# Patient Record
Sex: Female | Born: 1995 | Race: White | Hispanic: No | Marital: Married | Smoking: Never smoker
Health system: Southern US, Community
[De-identification: ages and names within clinical notes are randomized; demographics above are authoritative.]

## PROBLEM LIST (undated history)

## (undated) ENCOUNTER — Inpatient Hospital Stay (HOSPITAL_COMMUNITY): Payer: Self-pay

## (undated) ENCOUNTER — Ambulatory Visit: Admission: EM | Payer: BC Managed Care – PPO

## (undated) DIAGNOSIS — F419 Anxiety disorder, unspecified: Secondary | ICD-10-CM

## (undated) DIAGNOSIS — I499 Cardiac arrhythmia, unspecified: Secondary | ICD-10-CM

## (undated) HISTORY — PX: UPPER GI ENDOSCOPY: SHX6162

## (undated) HISTORY — PX: WISDOM TOOTH EXTRACTION: SHX21

## (undated) HISTORY — DX: Cardiac arrhythmia, unspecified: I49.9

---
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
## 2014-08-09 ENCOUNTER — Ambulatory Visit: Payer: Self-pay

## 2016-11-19 ENCOUNTER — Ambulatory Visit (INDEPENDENT_AMBULATORY_CARE_PROVIDER_SITE_OTHER): Payer: BC Managed Care – PPO | Admitting: Certified Nurse Midwife

## 2016-11-19 ENCOUNTER — Encounter: Payer: Self-pay | Admitting: Certified Nurse Midwife

## 2016-11-19 VITALS — BP 126/86 | HR 83 | Ht 68.0 in | Wt 235.2 lb

## 2016-11-19 DIAGNOSIS — N912 Amenorrhea, unspecified: Secondary | ICD-10-CM

## 2016-11-19 LAB — POCT URINE PREGNANCY: Preg Test, Ur: NEGATIVE

## 2016-11-19 MED ORDER — MEDROXYPROGESTERONE ACETATE 10 MG PO TABS: 10.0000 mg | ORAL_TABLET | Freq: Every day | ORAL | 0 refills | Status: DC

## 2016-11-19 NOTE — Progress Notes (Signed)
Subjective:    Adriana Lewis is a 21 y.o. female who presents for evaluation missed menses. Last menstrual period was 2 month ago. Current menses pattern: regular ( previously).   Current contraceptive method: OCP (estrogen/progesterone). Associated/contributing factors include none. Patient denies acne, family history of diabetes, hirsutism, infertility and low body fat. Evaluation to date: none Patient's last menstrual period was 10/06/2016. The following portions of the patient's history were reviewed and updated as appropriate: allergies, current medications, past family history, past medical history, past social history, past surgical history and problem list. Review of Systems Constitutional: negative Eyes: negative Ears, nose, mouth, throat, and face: negative Respiratory: negative Cardiovascular: negative Gastrointestinal: negative Genitourinary:negative Integument/breast: negative Hematologic/lymphatic: negative Musculoskeletal:negative Neurological: negative Behavioral/Psych: negative Endocrine: negative    Objective:    No exam performed today, not indicated.  She had a recent physical a few months ago with a noral pap smear.   Urine pregnancy test: negative Assessment:  Amenorrhea   Labs Pending :FSH/LH, Prolactin, TSH panel, Testosterone  Ultrasound ordered.  Plan:  Will review ultrasound results and labs, call with results and instructed when to start provera challenge test.   Discussed the evaluation and treatment of amenorrhea with the patient. Weight goals: she is currently losing weight with exercise and diet     Will schedule return appoint with completion of provera challenge test.   Doreene Burke, CNM

## 2016-11-19 NOTE — Patient Instructions (Signed)
Secondary Amenorrhea Secondary amenorrhea is the stopping of menstrual flow for 3-6 months in a female who has previously had periods. There are many possible causes. Most of these causes are not serious. Usually, treating the underlying problem causing the loss of menses will return your periods to normal. What are the causes? Some common and uncommon causes of not menstruating include:  Malnutrition.  Low blood sugar (hypoglycemia).  Polycystic ovary disease.  Stress or fear.  Breastfeeding.  Hormone imbalance.  Ovarian failure.  Medicines.  Extreme obesity.  Cystic fibrosis.  Low body weight or drastic weight reduction from any cause.  Early menopause.  Removal of ovaries or uterus.  Contraceptives.  Illness.  Long-term (chronic) illnesses.  Cushing syndrome.  Thyroid problems.  Birth control pills, patches, or vaginal rings for birth control. What increases the risk? You may be at greater risk of secondary amenorrhea if:  You have a family history of this condition.  You have an eating disorder.  You do athletic training. How is this diagnosed? A diagnosis is made by your health care provider taking a medical history and doing a physical exam. This will include a pelvic exam to check for problems with your reproductive organs. Pregnancy must be ruled out. Often, numerous blood tests are done to measure different hormones in the body. Urine testing may be done. Specialized exams (ultrasound, CT scan, MRI, or hysteroscopy) may have to be done as well as measuring the body mass index (BMI). How is this treated? Treatment depends on the cause of the amenorrhea. If an eating disorder is present, this can be treated with an adequate diet and therapy. Chronic illnesses may improve with treatment of the illness. Amenorrhea may be corrected with medicines, lifestyle changes, or surgery. If the amenorrhea cannot be corrected, it is sometimes possible to create a false  menstruation with medicines. Follow these instructions at home:  Maintain a healthy diet.  Manage weight problems.  Exercise regularly but not excessively.  Get adequate sleep.  Manage stress.  Be aware of changes in your menstrual cycle. Keep a record of when your periods occur. Note the date your period starts, how long it lasts, and any problems. Contact a health care provider if: Your symptoms do not get better with treatment. This information is not intended to replace advice given to you by your health care provider. Make sure you discuss any questions you have with your health care provider. Document Released: 09/15/2006 Document Revised: 01/10/2016 Document Reviewed: 01/20/2013 Elsevier Interactive Patient Education  2017 [employer] Inc[employer]  

## 2016-11-20 LAB — THYROID PANEL WITH TSH
Free Thyroxine Index: 2.6 (ref 1.2–4.9)
T3 Uptake Ratio: 23 % — ABNORMAL LOW (ref 24–39)
T4, Total: 11.1 ug/dL (ref 4.5–12.0)
TSH: 0.944 u[IU]/mL (ref 0.450–4.500)

## 2016-11-20 LAB — FSH/LH
FSH: 5.4 m[IU]/mL
LH: 7.1 m[IU]/mL

## 2016-11-20 LAB — PROLACTIN: Prolactin: 18.1 ng/mL (ref 4.8–23.3)

## 2016-11-20 LAB — TESTOSTERONE: Testosterone: 55 ng/dL — ABNORMAL HIGH (ref 8–48)

## 2016-11-21 ENCOUNTER — Ambulatory Visit (INDEPENDENT_AMBULATORY_CARE_PROVIDER_SITE_OTHER): Payer: BC Managed Care – PPO

## 2016-11-21 DIAGNOSIS — N912 Amenorrhea, unspecified: Secondary | ICD-10-CM | POA: Diagnosis not present

## 2016-11-24 ENCOUNTER — Other Ambulatory Visit: Payer: Self-pay | Admitting: Certified Nurse Midwife

## 2016-11-24 DIAGNOSIS — Z0000 Encounter for general adult medical examination without abnormal findings: Secondary | ICD-10-CM

## 2016-11-24 NOTE — Progress Notes (Signed)
Telephone call to [facility] .Lab results reviewed. Discussed possibility of PCOS. She informed me that her sister has it and that her sister was recently diagnosed with diabetes.  Instructed her to start provera as ordered. Then when she has her period to start her oral birth control pills as directed. Reviewed PCOS, discussed use of birth control to mange it, and reviewed infertility. Given her family history encouraged Cheila to have a fasting glucose drawn. She is in agreement of plan. She will call for lab appointment for blood draw. Consulted Dr. Logan Bores regarding plan of care. She will follow up as needed.   Doreene Burke, CNM

## 2016-12-01 ENCOUNTER — Other Ambulatory Visit: Payer: Self-pay

## 2016-12-01 ENCOUNTER — Other Ambulatory Visit: Payer: Self-pay | Admitting: Certified Nurse Midwife

## 2016-12-01 ENCOUNTER — Other Ambulatory Visit: Payer: BC Managed Care – PPO

## 2016-12-01 DIAGNOSIS — Z0000 Encounter for general adult medical examination without abnormal findings: Secondary | ICD-10-CM

## 2016-12-01 NOTE — Addendum Note (Signed)
Addended by: Marchelle Folks on: 12/01/2016 08:23 AM   Modules accepted: Orders

## 2016-12-02 ENCOUNTER — Encounter: Payer: Self-pay | Admitting: Certified Nurse Midwife

## 2016-12-02 LAB — HEMOGLOBIN A1C
Est. average glucose Bld gHb Est-mCnc: 91 mg/dL
Hgb A1c MFr Bld: 4.8 % (ref 4.8–5.6)

## 2016-12-04 ENCOUNTER — Telehealth: Payer: Self-pay | Admitting: Certified Nurse Midwife

## 2016-12-04 NOTE — Telephone Encounter (Signed)
Patient called stated she is on day 8 and still has not started her cycle.Please Advise.Thanks

## 2016-12-08 NOTE — Telephone Encounter (Signed)
Called Adriana Lewis to discuss results of provera challenge. Instructed her to start her birth control pills.   Doreene Burke, CNM

## 2016-12-10 ENCOUNTER — Encounter: Payer: Self-pay | Admitting: Certified Nurse Midwife

## 2016-12-10 ENCOUNTER — Ambulatory Visit (INDEPENDENT_AMBULATORY_CARE_PROVIDER_SITE_OTHER): Payer: BC Managed Care – PPO | Admitting: Certified Nurse Midwife

## 2016-12-10 VITALS — BP 124/85 | HR 92 | Ht 68.0 in | Wt 234.9 lb

## 2016-12-10 DIAGNOSIS — N926 Irregular menstruation, unspecified: Secondary | ICD-10-CM

## 2016-12-10 LAB — POCT URINE PREGNANCY: Preg Test, Ur: POSITIVE — AB

## 2016-12-10 NOTE — Patient Instructions (Signed)
Common Medications Safe in Pregnancy  Acne:      Constipation:  Benzoyl Peroxide     Colace  Clindamycin      Dulcolax Suppository  Topica Erythromycin     Fibercon  Salicylic Acid      Metamucil         Miralax AVOID:        Senakot   Accutane    Cough:  Retin-A       Cough Drops  Tetracycline      Phenergan w/ Codeine if Rx  Minocycline      Robitussin (Plain & DM)  Antibiotics:     Crabs/Lice:  Ceclor       RID  Cephalosporins    AVOID:  E-Mycins      Kwell  Keflex  Macrobid/Macrodantin   Diarrhea:  Penicillin      Kao-Pectate  Zithromax      Imodium AD         PUSH FLUIDS AVOID:       Cipro     Fever:  Tetracycline      Tylenol (Regular or Extra  Minocycline       Strength)  Levaquin      Extra Strength-Do not          Exceed 8 tabs/24 hrs Caffeine:        <200mg/day (equiv. To 1 cup of coffee or  approx. 3 12 oz sodas)         Gas: Cold/Hayfever:       Gas-X  Benadryl      Mylicon  Claritin       Phazyme  **Claritin-D        Chlor-Trimeton    Headaches:  Dimetapp      ASA-Free Excedrin  Drixoral-Non-Drowsy     Cold Compress  Mucinex (Guaifenasin)     Tylenol (Regular or Extra  Sudafed/Sudafed-12 Hour     Strength)  **Sudafed PE Pseudoephedrine   Tylenol Cold & Sinus     Vicks Vapor Rub  Zyrtec  **AVOID if Problems With Blood Pressure         Heartburn: Avoid lying down for at least 1 hour after meals  Aciphex      Maalox     Rash:  Milk of Magnesia     Benadryl    Mylanta       1% Hydrocortisone Cream  Pepcid  Pepcid Complete   Sleep Aids:  Prevacid      Ambien   Prilosec       Benadryl  Rolaids       Chamomile Tea  Tums (Limit 4/day)     Unisom  Zantac       Tylenol PM         Warm milk-add vanilla or  Hemorrhoids:       Sugar for taste  Anusol/Anusol H.C.  (RX: Analapram 2.5%)  Sugar Substitutes:  Hydrocortisone OTC     Ok in moderation  Preparation H      Tucks        Vaseline lotion applied to tissue with  wiping    Herpes:     Throat:  Acyclovir      Oragel  Famvir  Valtrex     Vaccines:         Flu Shot Leg Cramps:       *Gardasil  Benadryl      Hepatitis A         Hepatitis B Nasal Spray:         Pneumovax  Saline Nasal Spray     Polio Booster         Tetanus Nausea:       Tuberculosis test or PPD  Vitamin B6 25 mg TID   AVOID:    Dramamine      *Gardasil  Emetrol       Live Poliovirus  Ginger Root 250 mg QID    MMR (measles, mumps &  High Complex Carbs @ Bedtime    rebella)  Sea Bands-Accupressure    Varicella (Chickenpox)  Unisom 1/2 tab TID     *No known complications           If received before Pain:         Known pregnancy;   Darvocet       Resume series after  Lortab        Delivery  Percocet    Yeast:   Tramadol      Femstat  Tylenol 3      Gyne-lotrimin  Ultram       Monistat  Vicodin           MISC:         All Sunscreens           Hair Coloring/highlights          Insect Repellant's          (Including DEET)         Mystic Tans How a Baby Grows During Pregnancy Pregnancy begins when a female's sperm enters a female's egg (fertilization). This happens in one of the tubes (fallopian tubes) that connect the ovaries to the womb (uterus). The fertilized egg is called an embryo until it reaches 10 weeks. From 10 weeks until birth, it is called a fetus. The fertilized egg moves down the fallopian tube to the uterus. Then it implants into the lining of the uterus and begins to grow. The developing fetus receives oxygen and nutrients through the pregnant woman's bloodstream and the tissues that grow (placenta) to support the fetus. The placenta is the life support system for the fetus. It provides nutrition and removes waste. Learning as much as you can about your pregnancy and how your baby is developing can help you enjoy the experience. It can also make you aware of when there might be a problem and when to ask questions. How long does a typical pregnancy last? A pregnancy  usually lasts 280 days, or about 40 weeks. Pregnancy is divided into three trimesters:  First trimester: 0-13 weeks.  Second trimester: 14-27 weeks.  Third trimester: 28-40 weeks. The day when your baby is considered ready to be born (full term) is your estimated date of delivery. How does my baby develop month by month? First month  The fertilized egg attaches to the inside of the uterus.  Some cells will form the placenta. Others will form the fetus.  The arms, legs, brain, spinal cord, lungs, and heart begin to develop.  At the end of the first month, the heart begins to beat. Second month  The bones, inner ear, eyelids, hands, and feet form.  The genitals develop.  By the end of 8 weeks, all major organs are developing. Third month  All of the internal organs are forming.  Teeth develop below the gums.  Bones and muscles begin to grow. The spine can flex.  The skin is transparent.  Fingernails and toenails begin to form.  Arms and legs continue to grow longer, and hands  and feet develop.  The fetus is about 3 in (7.6 cm) long. Fourth month  The placenta is completely formed.  The external sex organs, neck, outer ear, eyebrows, eyelids, and fingernails are formed.  The fetus can hear, swallow, and move its arms and legs.  The kidneys begin to produce urine.  The skin is covered with a white waxy coating (vernix) and very fine hair (lanugo). Fifth month  The fetus moves around more and can be felt for the first time (quickening).  The fetus starts to sleep and wake up and may begin to suck its finger.  The nails grow to the end of the fingers.  The organ in the digestive system that makes bile (gallbladder) functions and helps to digest the nutrients.  If your baby is a girl, eggs are present in her ovaries. If your baby is a boy, testicles start to move down into his scrotum. Sixth month  The lungs are formed, but the fetus is not yet able to  breathe.  The eyes open. The brain continues to develop.  Your baby has fingerprints and toe prints. Your baby's hair grows thicker.  At the end of the second trimester, the fetus is about 9 in (22.9 cm) long. Seventh month  The fetus kicks and stretches.  The eyes are developed enough to sense changes in light.  The hands can make a grasping motion.  The fetus responds to sound. Eighth month  All organs and body systems are fully developed and functioning.  Bones harden and taste buds develop. The fetus may hiccup.  Certain areas of the brain are still developing. The skull remains soft. Ninth month  The fetus gains about  lb (0.23 kg) each week.  The lungs are fully developed.  Patterns of sleep develop.  The fetus's head typically moves into a head-down position (vertex) in the uterus to prepare for birth. If the buttocks move into a vertex position instead, the baby is breech.  The fetus weighs 6-9 lbs (2.72-4.08 kg) and is 19-20 in (48.26-50.8 cm) long. What can I do to have a healthy pregnancy and help my baby develop?  Eating and Drinking  Eat a healthy diet.  Talk with your health care provider to make sure that you are getting the nutrients that you and your baby need.  Visit www.BuildDNA.es to learn about creating a healthy diet.  Gain a healthy amount of weight during pregnancy as advised by your health care provider. This is usually 25-35 pounds. You may need to:  Gain more if you were underweight before getting pregnant or if you are pregnant with more than one baby.  Gain less if you were overweight or obese when you got pregnant. Medicines and Vitamins  Take prenatal vitamins as directed by your health care provider. These include vitamins such as folic acid, iron, calcium, and vitamin D. They are important for healthy development.  Take medicines only as directed by your health care provider. Read labels and ask a pharmacist or your health  care provider whether over-the-counter medicines, supplements, and prescription drugs are safe to take during pregnancy. Activities  Be physically active as advised by your health care provider. Ask your health care provider to recommend activities that are safe for you to do, such as walking or swimming.  Do not participate in strenuous or extreme sports. Lifestyle  Do not drink alcohol.  Do not use any tobacco products, including cigarettes, chewing tobacco, or electronic cigarettes. If you need help  quitting, ask your health care provider.  Do not use illegal drugs. Safety  Avoid exposure to mercury, lead, or other heavy metals. Ask your health care provider about common sources of these heavy metals.  Avoid listeria infection during pregnancy. Follow these precautions:  Do not eat soft cheeses or deli meats.  Do not eat hot dogs unless they have been warmed up to the point of steaming, such as in the microwave oven.  Do not drink unpasteurized milk.  Avoid toxoplasmosis infection during pregnancy. Follow these precautions:  Do not change your cat's litter box, if you have a cat. Ask someone else to do this for you.  Wear gardening gloves while working in the yard. General Instructions  Keep all follow-up visits as directed by your health care provider. This is important. This includes prenatal care and screening tests.  Manage any chronic health conditions. Work closely with your health care provider to keep conditions, such as diabetes, under control. How do I know if my baby is developing well? At each prenatal visit, your health care provider will do several different tests to check on your health and keep track of your baby's development. These include:  Fundal height.  Your health care provider will measure your growing belly from top to bottom using a tape measure.  Your health care provider will also feel your belly to determine your baby's  position.  Heartbeat.  An ultrasound in the first trimester can confirm pregnancy and show a heartbeat, depending on how far along you are.  Your health care provider will check your baby's heart rate at every prenatal visit.  As you get closer to your delivery date, you may have regular fetal heart rate monitoring to make sure that your baby is not in distress.  Second trimester ultrasound.  This ultrasound checks your baby's development. It also indicates your baby's gender. What should I do if I have concerns about my baby's development? Always talk with your health care provider about any concerns that you may have. This information is not intended to replace advice given to you by your health care provider. Make sure you discuss any questions you have with your health care provider. Document Released: 01/21/2008 Document Revised: 01/10/2016 Document Reviewed: 01/11/2014 Elsevier Interactive Patient Education  2017 [employer].

## 2016-12-10 NOTE — Progress Notes (Signed)
Subjective:    Adriana Lewis is a 21 y.o. female who presents for evaluation of amenorrhea. She came in on 11/19/16 for evaluation of amenorrhea and pregnancy test was negative. PCOS work up showed evidence of PCOS. Provera challenge test did not result in a bleed. She believes she could be pregnant. At home pregnancy test was positive.  Pregnancy is desired. Sexual Activity: single partner, contraception: none. Current symptoms also include: breast tenderness and nausea. Last period was abnormal more than 2 months ago.    No LMP recorded. Patient is not currently having periods (Reason: Oral contraceptives). The following portions of the patient's history were reviewed and updated as appropriate: allergies, current medications, past family history, past medical history, past social history, past surgical history and problem list.  Review of Systems Pertinent items are noted in HPI.     Objective:    BP 124/85   Pulse 92   Ht  (1.727 m)   Wt 234 lb 14.4 oz (106.5 kg)   LMP 10/06/2016 (Exact Date) Comment: irregular  BMI 35.72 kg/m  General: alert, cooperative, appears stated age and no acute distress    Lab Review Urine HCG: positive    Assessment:    Absence of menstruation.     Plan:    Pregnancy Test: Briefly discussed pre-natal care options.  Encouraged well-balanced diet, plenty of rest when needed, pre-natal vitamins daily and walking for exercise. Discussed self-help for nausea, avoiding OTC medications until consulting provider or pharmacist, other than Tylenol as needed, minimal caffeine (1-2 cups daily) and avoiding alcohol. She will schedule  A dating ultrasound ASAP. Nurse visit in 1 wks and a NOB physical at 12 wks pregnancy.  Doreene Burke, CNM

## 2016-12-12 ENCOUNTER — Other Ambulatory Visit: Payer: BC Managed Care – PPO

## 2016-12-15 ENCOUNTER — Ambulatory Visit (INDEPENDENT_AMBULATORY_CARE_PROVIDER_SITE_OTHER): Payer: BC Managed Care – PPO

## 2016-12-15 DIAGNOSIS — N926 Irregular menstruation, unspecified: Secondary | ICD-10-CM

## 2016-12-22 ENCOUNTER — Ambulatory Visit: Payer: BC Managed Care – PPO

## 2016-12-22 ENCOUNTER — Ambulatory Visit (INDEPENDENT_AMBULATORY_CARE_PROVIDER_SITE_OTHER): Payer: BC Managed Care – PPO | Admitting: Certified Nurse Midwife

## 2016-12-22 ENCOUNTER — Encounter: Payer: Self-pay | Admitting: Certified Nurse Midwife

## 2016-12-22 ENCOUNTER — Other Ambulatory Visit: Payer: Self-pay | Admitting: Certified Nurse Midwife

## 2016-12-22 VITALS — BP 120/79 | HR 83 | Ht 68.0 in | Wt 233.6 lb

## 2016-12-22 DIAGNOSIS — O3680X0 Pregnancy with inconclusive fetal viability, not applicable or unspecified: Secondary | ICD-10-CM

## 2016-12-22 DIAGNOSIS — R109 Unspecified abdominal pain: Secondary | ICD-10-CM | POA: Diagnosis not present

## 2016-12-22 NOTE — Progress Notes (Signed)
Subjective:    Adriana Lewis is a 21 y.o. female. Maghan reports moderate uterine cramping for the past few days with no vaginal bleeding.   She is not in acute distress. Ectopic risks: none.  Cycle length: irregular . Pregnancy testing: quant HCG level today  on 12/22/16. Pregnancy imaging: transvaginal ultrasound done on 12/15/16. Result 5 week 1 day gestational sac is seen in the proper place within the uterus. No yolk sac or fetal pole is seen at this time, however, this is most likely due to the early gestational age.. U/s today shows : Gestational sac measurement. consistent with 6 1/[redacted] weeks gestation, giving an (U/S) EDD of 08/16/17.    The following portions of the patient's history were reviewed and updated as appropriate: allergies, current medications, past family history, past medical history, past social history, past surgical history and problem list.  Review of Systems Pertinent items noted in HPI and remainder of comprehensive ROS otherwise negative.   Objective:     BP 120/79   Pulse 83   Ht 5\' 8"  (1.727 m)   Wt 233 lb 9 oz (105.9 kg)   LMP 10/06/2016 (Exact Date) Comment: irregular  BMI 35.51 kg/m   Imaging  ULTRASOUND REPORT  Location: [facility] Date of Service: 12/22/16  Indications:Cramping Findings:  Intrauterine pregnancy is visualized with Gestational sac measurement. consistent with 6 1/[redacted] weeks gestation, giving an (U/S) EDD of 08/16/17. LMP is unknown.  Right Ovary measures 2.4 x 1.8 x 2.1cm. It is normal in appearance. Left Ovary is not seen. There is evidence of a corpus luteal cyst in the Right Survey of the adnexa demonstrates no adnexal masses. There is no free peritoneal fluid in the cul de sac.  Impression: 1. 6 1/7 week Intrauterine pregnancy by U/S measurement of gestational sac. 2. (U/S) EDD 08/16/17 is by Gestational sac measurement.  Recommendations: 1.Clinical correlation with the patient's History and  Physical Exam. 2. Return in one week for viability scan  Boyce MediciMaria E Hill  Assessment:      Abdominal pain in early pregnancy   Plan:      Follow appointment for ultrasound next week, new OB nurse visit as scheduled. Reviewed signs and symptoms of miscarriage and when to go to ED. Follow up as scheduled.   Doreene BurkeAnnie Shaundrea Carrigg, CNM

## 2016-12-22 NOTE — Patient Instructions (Signed)

## 2016-12-23 LAB — BETA HCG QUANT (REF LAB): hCG Quant: 10790 m[IU]/mL

## 2016-12-26 ENCOUNTER — Other Ambulatory Visit: Payer: Self-pay | Admitting: Certified Nurse Midwife

## 2016-12-26 DIAGNOSIS — O3680X0 Pregnancy with inconclusive fetal viability, not applicable or unspecified: Secondary | ICD-10-CM

## 2016-12-29 ENCOUNTER — Ambulatory Visit (INDEPENDENT_AMBULATORY_CARE_PROVIDER_SITE_OTHER): Payer: BC Managed Care – PPO

## 2016-12-29 DIAGNOSIS — O3680X0 Pregnancy with inconclusive fetal viability, not applicable or unspecified: Secondary | ICD-10-CM

## 2017-01-09 ENCOUNTER — Ambulatory Visit (INDEPENDENT_AMBULATORY_CARE_PROVIDER_SITE_OTHER): Payer: BC Managed Care – PPO | Admitting: Obstetrics and Gynecology

## 2017-01-09 VITALS — BP 114/71 | HR 77 | Ht 68.0 in | Wt 235.6 lb

## 2017-01-09 DIAGNOSIS — Z3401 Encounter for supervision of normal first pregnancy, first trimester: Secondary | ICD-10-CM

## 2017-01-09 DIAGNOSIS — R638 Other symptoms and signs concerning food and fluid intake: Secondary | ICD-10-CM

## 2017-01-09 NOTE — Progress Notes (Signed)
I have reviewed the record and concur with information documented by [place], CMA.   Hildred Laserherry, Kailana Benninger, MD [facility]

## 2017-01-09 NOTE — Patient Instructions (Signed)
First Trimester of Pregnancy The first trimester of pregnancy is from week 1 until the end of week 13 (months 1 through 3). A week after a sperm fertilizes an egg, the egg will implant on the wall of the uterus. This embryo will begin to develop into a baby. Genes from you and your partner will form the baby. The female genes will determine whether the baby will be a boy or a girl. At 6-8 weeks, the eyes and face will be formed, and the heartbeat can be seen on ultrasound. At the end of 12 weeks, all the baby's organs will be formed. Now that you are pregnant, you will want to do everything you can to have a healthy baby. Two of the most important things are to get good prenatal care and to follow your health care provider's instructions. Prenatal care is all the medical care you receive before the baby's birth. This care will help prevent, find, and treat any problems during the pregnancy and childbirth. Body changes during your first trimester Your body goes through many changes during pregnancy. The changes vary from woman to woman.  You may gain or lose a couple of pounds at first.  You may feel sick to your stomach (nauseous) and you may throw up (vomit). If the vomiting is uncontrollable, call your health care provider.  You may tire easily.  You may develop headaches that can be relieved by medicines. All medicines should be approved by your health care provider.  You may urinate more often. Painful urination may mean you have a bladder infection.  You may develop heartburn as a result of your pregnancy.  You may develop constipation because certain hormones are causing the muscles that push stool through your intestines to slow down.  You may develop hemorrhoids or swollen veins (varicose veins).  Your breasts may begin to grow larger and become tender. Your nipples may stick out more, and the tissue that surrounds them (areola) may become darker.  Your gums may bleed and may be  sensitive to brushing and flossing.  Dark spots or blotches (chloasma, mask of pregnancy) may develop on your face. This will likely fade after the baby is born.  Your menstrual periods will stop.  You may have a loss of appetite.  You may develop cravings for certain kinds of food.  You may have changes in your emotions from day to day, such as being excited to be pregnant or being concerned that something may go wrong with the pregnancy and baby.  You may have more vivid and strange dreams.  You may have changes in your hair. These can include thickening of your hair, rapid growth, and changes in texture. Some women also have hair loss during or after pregnancy, or hair that feels dry or thin. Your hair will most likely return to normal after your baby is born.  What to expect at prenatal visits During a routine prenatal visit:  You will be weighed to make sure you and the baby are growing normally.  Your blood pressure will be taken.  Your abdomen will be measured to track your baby's growth.  The fetal heartbeat will be listened to between weeks 10 and 14 of your pregnancy.  Test results from any previous visits will be discussed.  Your health care provider may ask you:  How you are feeling.  If you are feeling the baby move.  If you have had any abnormal symptoms, such as leaking fluid, bleeding, severe headaches,   or abdominal cramping.  If you are using any tobacco products, including cigarettes, chewing tobacco, and electronic cigarettes.  If you have any questions.  Other tests that may be performed during your first trimester include:  Blood tests to find your blood type and to check for the presence of any previous infections. The tests will also be used to check for low iron levels (anemia) and protein on red blood cells (Rh antibodies). Depending on your risk factors, or if you previously had diabetes during pregnancy, you may have tests to check for high blood  sugar that affects pregnant women (gestational diabetes).  Urine tests to check for infections, diabetes, or protein in the urine.  An ultrasound to confirm the proper growth and development of the baby.  Fetal screens for spinal cord problems (spina bifida) and Down syndrome.  HIV (human immunodeficiency virus) testing. Routine prenatal testing includes screening for HIV, unless you choose not to have this test.  You may need other tests to make sure you and the baby are doing well.  Follow these instructions at home: Medicines  Follow your health care provider's instructions regarding medicine use. Specific medicines may be either safe or unsafe to take during pregnancy.  Take a prenatal vitamin that contains at least 600 micrograms (mcg) of folic acid.  If you develop constipation, try taking a stool softener if your health care provider approves. Eating and drinking  Eat a balanced diet that includes fresh fruits and vegetables, whole grains, good sources of protein such as meat, eggs, or tofu, and low-fat dairy. Your health care provider will help you determine the amount of weight gain that is right for you.  Avoid raw meat and uncooked cheese. These carry germs that can cause birth defects in the baby.  Eating four or five small meals rather than three large meals a day may help relieve nausea and vomiting. If you start to feel nauseous, eating a few soda crackers can be helpful. Drinking liquids between meals, instead of during meals, also seems to help ease nausea and vomiting.  Limit foods that are high in fat and processed sugars, such as fried and sweet foods.  To prevent constipation: ? Eat foods that are high in fiber, such as fresh fruits and vegetables, whole grains, and beans. ? Drink enough fluid to keep your urine clear or pale yellow. Activity  Exercise only as directed by your health care provider. Most women can continue their usual exercise routine during  pregnancy. Try to exercise for 30 minutes at least 5 days a week. Exercising will help you: ? Control your weight. ? Stay in shape. ? Be prepared for labor and delivery.  Experiencing pain or cramping in the lower abdomen or lower back is a good sign that you should stop exercising. Check with your health care provider before continuing with normal exercises.  Try to avoid standing for long periods of time. Move your legs often if you must stand in one place for a long time.  Avoid heavy lifting.  Wear low-heeled shoes and practice good posture.  You may continue to have sex unless your health care provider tells you not to. Relieving pain and discomfort  Wear a good support bra to relieve breast tenderness.  Take warm sitz baths to soothe any pain or discomfort caused by hemorrhoids. Use hemorrhoid cream if your health care provider approves.  Rest with your legs elevated if you have leg cramps or low back pain.  If you develop   varicose veins in your legs, wear support hose. Elevate your feet for 15 minutes, 3-4 times a day. Limit salt in your diet. Prenatal care  Schedule your prenatal visits by the twelfth week of pregnancy. They are usually scheduled monthly at first, then more often in the last 2 months before delivery.  Write down your questions. Take them to your prenatal visits.  Keep all your prenatal visits as told by your health care provider. This is important. Safety  Wear your seat belt at all times when driving.  Make a list of emergency phone numbers, including numbers for family, friends, the hospital, and police and fire departments. General instructions  Ask your health care provider for a referral to a local prenatal education class. Begin classes no later than the beginning of month 6 of your pregnancy.  Ask for help if you have counseling or nutritional needs during pregnancy. Your health care provider can offer advice or refer you to specialists for help  with various needs.  Do not use hot tubs, steam rooms, or saunas.  Do not douche or use tampons or scented sanitary pads.  Do not cross your legs for long periods of time.  Avoid cat litter boxes and soil used by cats. These carry germs that can cause birth defects in the baby and possibly loss of the fetus by miscarriage or stillbirth.  Avoid all smoking, herbs, alcohol, and medicines not prescribed by your health care provider. Chemicals in these products affect the formation and growth of the baby.  Do not use any products that contain nicotine or tobacco, such as cigarettes and e-cigarettes. If you need help quitting, ask your health care provider. You may receive counseling support and other resources to help you quit.  Schedule a dentist appointment. At home, brush your teeth with a soft toothbrush and be gentle when you floss. Contact a health care provider if:  You have dizziness.  You have mild pelvic cramps, pelvic pressure, or nagging pain in the abdominal area.  You have persistent nausea, vomiting, or diarrhea.  You have a bad smelling vaginal discharge.  You have pain when you urinate.  You notice increased swelling in your face, hands, legs, or ankles.  You are exposed to fifth disease or chickenpox.  You are exposed to German measles (rubella) and have never had it. Get help right away if:  You have a fever.  You are leaking fluid from your vagina.  You have spotting or bleeding from your vagina.  You have severe abdominal cramping or pain.  You have rapid weight gain or loss.  You vomit blood or material that looks like coffee grounds.  You develop a severe headache.  You have shortness of breath.  You have any kind of trauma, such as from a fall or a car accident. Summary  The first trimester of pregnancy is from week 1 until the end of week 13 (months 1 through 3).  Your body goes through many changes during pregnancy. The changes vary from  woman to woman.  You will have routine prenatal visits. During those visits, your health care provider will examine you, discuss any test results you may have, and talk with you about how you are feeling. This information is not intended to replace advice given to you by your health care provider. Make sure you discuss any questions you have with your health care provider. Document Released: 07/29/2001 Document Revised: 07/16/2016 Document Reviewed: 07/16/2016 Elsevier Interactive Patient Education  2017 [employer]   Inc[employer]  

## 2017-01-09 NOTE — Progress Notes (Signed)
Adriana Lewis presents for NOB nurse interview visit. Pregnancy confirmation done at [facility].  G1 .  P0 . Pregnancy education material explained and given. No cats in the home. NOB labs ordered. TSH/HbgA1c due to Increased BMI, Will also need early glucola. HIV labs and Drug screen were explained optional, Drug screen declined. PNV encouraged, currently taking. Genetic screening options discussed. Genetic testing: Ordered, will come back in 3 weeks for panorma and horizon.  Pt may discuss with provider. Pt. To follow up with provider in 4 weeks for NOB physical.  All questions answered.

## 2017-01-10 LAB — CBC WITH DIFFERENTIAL/PLATELET
Basophils Absolute: 0 10*3/uL (ref 0.0–0.2)
Basos: 0 %
EOS (ABSOLUTE): 0.1 10*3/uL (ref 0.0–0.4)
Eos: 1 %
Hematocrit: 37.9 % (ref 34.0–46.6)
Hemoglobin: 12.9 g/dL (ref 11.1–15.9)
Immature Grans (Abs): 0 10*3/uL (ref 0.0–0.1)
Immature Granulocytes: 0 %
Lymphocytes Absolute: 1.7 10*3/uL (ref 0.7–3.1)
Lymphs: 26 %
MCH: 29.9 pg (ref 26.6–33.0)
MCHC: 34 g/dL (ref 31.5–35.7)
MCV: 88 fL (ref 79–97)
Monocytes Absolute: 0.4 10*3/uL (ref 0.1–0.9)
Monocytes: 6 %
Neutrophils Absolute: 4.5 10*3/uL (ref 1.4–7.0)
Neutrophils: 67 %
Platelets: 287 10*3/uL (ref 150–379)
RBC: 4.32 x10E6/uL (ref 3.77–5.28)
RDW: 13.1 % (ref 12.3–15.4)
WBC: 6.7 10*3/uL (ref 3.4–10.8)

## 2017-01-10 LAB — URINALYSIS, ROUTINE W REFLEX MICROSCOPIC
Bilirubin, UA: NEGATIVE
Glucose, UA: NEGATIVE
Leukocytes, UA: NEGATIVE
Nitrite, UA: NEGATIVE
Protein, UA: NEGATIVE
RBC, UA: NEGATIVE
Specific Gravity, UA: 1.019 (ref 1.005–1.030)
Urobilinogen, Ur: 1 mg/dL (ref 0.2–1.0)
pH, UA: 5.5 (ref 5.0–7.5)

## 2017-01-10 LAB — ANTIBODY SCREEN: Antibody Screen: NEGATIVE

## 2017-01-10 LAB — RUBELLA SCREEN: Rubella Antibodies, IGG: <0.9 index — ABNORMAL LOW (ref >0.99)

## 2017-01-10 LAB — HIV ANTIBODY (ROUTINE TESTING W REFLEX): HIV Screen 4th Generation wRfx: NONREACTIVE

## 2017-01-10 LAB — HEMOGLOBIN A1C
Est. average glucose Bld gHb Est-mCnc: 94 mg/dL
Hgb A1c MFr Bld: 4.9 % (ref 4.8–5.6)

## 2017-01-10 LAB — THYROID PANEL WITH TSH
Free Thyroxine Index: 2.1 (ref 1.2–4.9)
T3 Uptake Ratio: 23 % — ABNORMAL LOW (ref 24–39)
T4, Total: 9 ug/dL (ref 4.5–12.0)
TSH: 1.54 u[IU]/mL (ref 0.450–4.500)

## 2017-01-10 LAB — ABO AND RH: Rh Factor: POSITIVE

## 2017-01-10 LAB — HEPATITIS B SURFACE ANTIGEN: Hepatitis B Surface Ag: NEGATIVE

## 2017-01-10 LAB — VARICELLA ZOSTER ANTIBODY, IGG: Varicella zoster IgG: <135 index — ABNORMAL LOW (ref >165)

## 2017-01-11 LAB — URINE CULTURE

## 2017-01-23 ENCOUNTER — Ambulatory Visit (INDEPENDENT_AMBULATORY_CARE_PROVIDER_SITE_OTHER): Payer: BC Managed Care – PPO

## 2017-01-23 ENCOUNTER — Telehealth: Payer: Self-pay

## 2017-01-23 ENCOUNTER — Encounter: Payer: Self-pay | Admitting: Obstetrics and Gynecology

## 2017-01-23 ENCOUNTER — Ambulatory Visit (INDEPENDENT_AMBULATORY_CARE_PROVIDER_SITE_OTHER): Payer: BC Managed Care – PPO | Admitting: Obstetrics and Gynecology

## 2017-01-23 ENCOUNTER — Ambulatory Visit (INDEPENDENT_AMBULATORY_CARE_PROVIDER_SITE_OTHER): Payer: BC Managed Care – PPO | Admitting: Certified Nurse Midwife

## 2017-01-23 VITALS — BP 115/73 | HR 101 | Wt 235.2 lb

## 2017-01-23 DIAGNOSIS — O200 Threatened abortion: Secondary | ICD-10-CM

## 2017-01-23 LAB — POCT URINALYSIS DIPSTICK
Bilirubin, UA: NEGATIVE
Glucose, UA: NEGATIVE
Ketones, UA: NEGATIVE
Nitrite, UA: NEGATIVE
Protein, UA: NEGATIVE
Spec Grav, UA: 1.01 (ref 1.010–1.025)
Urobilinogen, UA: 0.2 E.U./dL
pH, UA: 6.5 (ref 5.0–8.0)

## 2017-01-23 NOTE — Telephone Encounter (Signed)
Pt has appointment with Dr Logan BoresEvans for today.

## 2017-01-23 NOTE — Progress Notes (Signed)
HPI:      [name] is a 21 y.o. G1P0000 who LMP was Patient's last menstrual period was 10/06/2016 (exact date).  Subjective:   She presents today With complaint of 2 day history of vaginal spotting. She is approximately 9 weeks estimated gestational age by crown-rump length. She reports no cramping. No urinary symptoms.    Hx: The following portions of the patient's history were reviewed and updated as appropriate:             She  has a past medical history of Irregular heartbeat. She  does not have any pertinent problems on file. She  has no past surgical history on file. Her family history includes Cancer in her paternal grandmother; Cirrhosis in her father; Diabetes in her mother, paternal grandfather, and sister; Heart disease in her maternal grandmother; Hepatitis B in her father; Hypertension in her mother. She  reports that she has never smoked. She has never used smokeless tobacco. She reports that she does not drink alcohol or use drugs. She is allergic to latex and sulfa antibiotics.       Review of Systems:  Review of Systems  Constitutional: Denied constitutional symptoms, night sweats, recent illness, fatigue, fever, insomnia and weight loss.  Eyes: Denied eye symptoms, eye pain, photophobia, vision change and visual disturbance.  Ears/Nose/Throat/Neck: Denied ear, nose, throat or neck symptoms, hearing loss, nasal discharge, sinus congestion and sore throat.  Cardiovascular: Denied cardiovascular symptoms, arrhythmia, chest pain/pressure, edema, exercise intolerance, orthopnea and palpitations.  Respiratory: Denied pulmonary symptoms, asthma, pleuritic pain, productive sputum, cough, dyspnea and wheezing.  Gastrointestinal: Denied, gastro-esophageal reflux, melena, nausea and vomiting.  Genitourinary: See HPI for additional information.  Musculoskeletal: Denied musculoskeletal symptoms, stiffness, swelling, muscle weakness and myalgia.  Dermatologic:  Denied dermatology symptoms, rash and scar.  Neurologic: Denied neurology symptoms, dizziness, headache, neck pain and syncope.  Psychiatric: Denied psychiatric symptoms, anxiety and depression.  Endocrine: Denied endocrine symptoms including hot flashes and night sweats.   Meds:   Current Outpatient Prescriptions on File Prior to Visit  Medication Sig Dispense Refill  . Prenatal Vit-Fe Fumarate-FA (PRENATAL VITAMINS) 28-0.8 MG TABS Take by mouth.     No current facility-administered medications on file prior to visit.     Objective:     Vitals:   01/23/17 0920  BP: 115/73  Pulse: (!) 101              Physical examination   Pelvic:   Vulva: Normal appearance.  No lesions.  Vagina: No lesions or abnormalities noted.  Moderate amount of dark blood in vagina   Support: Normal pelvic support.  Urethra No masses tenderness or scarring.  Meatus Normal size without lesions or prolapse.  Cervix: Normal appearance.  No lesions.  Closed   Anus: Normal exam.  No lesions.  Perineum: Normal exam.  No lesions.        Bimanual   Uterus: Enlarged  Non-tender.  Mobile.  AV.  Adnexae: No masses.  Non-tender to palpation.  Cul-de-sac: Negative for abnormality.     Assessment:    G1P0000 Patient Active Problem List   Diagnosis Date Noted  . Abdominal cramping 12/22/2016  . Missed menses 12/10/2016  . 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 Ganglion and cyst of synovium, tendon, and bursa 09/25/2010     1. Threatened abortion in first trimester        Plan:            1.  Pelvic ultrasound today  2.  Pelvic rest 1 week.  3.  Urine for C&S Orders Orders Placed This Encounter  Procedures  . Urine Culture  . US OB Follow Up  . POCT urinalysis dipstick    No orders of the defined types were placed in this encounter.       F/U  No Follow-up on file.  Elonda Huskyavid J. Evans, M.D. 01/23/2017 10:22 AM

## 2017-01-25 LAB — URINE CULTURE

## 2017-01-25 NOTE — Patient Instructions (Signed)
Miscarriage A miscarriage is the sudden loss of an unborn baby (fetus) before the 20th week of pregnancy. Most miscarriages happen in the first 3 months of pregnancy. Sometimes, it happens before a woman even knows she is pregnant. A miscarriage is also called a "spontaneous miscarriage" or "early pregnancy loss." Having a miscarriage can be an emotional experience. Talk with your caregiver about any questions you may have about miscarrying, the grieving process, and your future pregnancy plans. What are the causes?  Problems with the fetal chromosomes that make it impossible for the baby to develop normally. Problems with the baby's genes or chromosomes are most often the result of errors that occur, by chance, as the embryo divides and grows. The problems are not inherited from the parents.  Infection of the cervix or uterus.  Hormone problems.  Problems with the cervix, such as having an incompetent cervix. This is when the tissue in the cervix is not strong enough to hold the pregnancy.  Problems with the uterus, such as an abnormally shaped uterus, uterine fibroids, or congenital abnormalities.  Certain medical conditions.  Smoking, drinking alcohol, or taking illegal drugs.  Trauma. Often, the cause of a miscarriage is unknown. What are the signs or symptoms?  Vaginal bleeding or spotting, with or without cramps or pain.  Pain or cramping in the abdomen or lower back.  Passing fluid, tissue, or blood clots from the vagina. How is this diagnosed? Your caregiver will perform a physical exam. You may also have an ultrasound to confirm the miscarriage. Blood or urine tests may also be ordered. How is this treated?  Sometimes, treatment is not necessary if you naturally pass all the fetal tissue that was in the uterus. If some of the fetus or placenta remains in the body (incomplete miscarriage), tissue left behind may become infected and must be removed. Usually, a dilation and  curettage (D and C) procedure is performed. During a D and C procedure, the cervix is widened (dilated) and any remaining fetal or placental tissue is gently removed from the uterus.  Antibiotic medicines are prescribed if there is an infection. Other medicines may be given to reduce the size of the uterus (contract) if there is a lot of bleeding.  If you have Rh negative blood and your baby was Rh positive, you will need a Rh immunoglobulin shot. This shot will protect any future baby from having Rh blood problems in future pregnancies. Follow these instructions at home:  Your caregiver may order bed rest or may allow you to continue light activity. Resume activity as directed by your caregiver.  Have someone help with home and family responsibilities during this time.  Keep track of the number of sanitary pads you use each day and how soaked (saturated) they are. Write down this information.  Do not use tampons. Do not douche or have sexual intercourse until approved by your caregiver.  Only take over-the-counter or prescription medicines for pain or discomfort as directed by your caregiver.  Do not take aspirin. Aspirin can cause bleeding.  Keep all follow-up appointments with your caregiver.  If you or your partner have problems with grieving, talk to your caregiver or seek counseling to help cope with the pregnancy loss. Allow enough time to grieve before trying to get pregnant again. Get help right away if:  You have severe cramps or pain in your back or abdomen.  You have a fever.  You pass large blood clots (walnut-sized or larger) ortissue from your   vagina. Save any tissue for your caregiver to inspect.  Your bleeding increases.  You have a thick, bad-smelling vaginal discharge.  You become lightheaded, weak, or you faint.  You have chills. This information is not intended to replace advice given to you by your health care provider. Make sure you discuss any questions  you have with your health care provider. Document Released: 01/28/2001 Document Revised: 01/10/2016 Document Reviewed: 09/23/2011 Elsevier Interactive Patient Education  2017 [employer].  FACTS YOU SHOULD KNOW  About Early Pregnancy Loss  WHAT IS AN EARLY PREGNANCY LOSS? Once the egg is fertilized with the sperm and begins to develop, it attaches to the lining of the uterus. This early pregnancy tissue may not develop into an embryo (the beginning stage of a baby). Sometimes an embryo does develop but does not continue to grow. These problems can be seen on ultrasound.   MANAGEMNT OF EARLY PREGNANCY LOSS: About 4 out of 100 (0.25%) women will have a pregnancy loss in her lifetime.  One in five pregnancies is found to be an early pregnancy loss.  There are 3 ways to care for an early pregnancy loss:   (1) Surgery, (2) Medicine, (3) Waiting for you to pass the pregnancy on your own. The decision as to how to proceed after being diagnosed with and early pregnancy loss is an individual one.  The decision can be made only after appropriate counseling.  You need to weigh the pros and cons of the 3 choices. Then you can make the choice that works for you.  SURGERY (D&E) . Procedure over in 1 day . Requires being put to sleep . Bleeding may be light . Possible problems during surgery, including injury to womb(uterus) . Care provider has more control Medicine (CYTOTEC) . The complete procedure may take days to weeks . No Surgery . Bleeding may be heavy at times . There may be drug side effects . Patient has more control Waiting . You may choose to wait, in which case your own body may complete the passing of the abnormal early pregnancy on its own in about 2-4 weeks . Your bleeding may be heavy at times . There is a small possibility that you may need surgery if the bleeding is too much or not all of the pregnancy has passed.  CYTOTEC MANAGEMENT Prostaglandins (cytotec) are the most widely  used drug for this purpose. They cause the uterus to cramp and contract. You will place the medicine yourself inside your vagina in the privacy of your home. Empting of the uterus should occur within 3 days but the process may continue for several weeks. The bleeding may seem heavy at times.  INSTRUCTIONS: Take all 4 tablets of cytotec ( total) at one time. This will cause a lot of cramping, you may have bleeding, and pass tissue, then the cramping and bleeding should get better. If you do not pass the tissue, then you can take 4 more tablets of cytotec ( total) 48 hours after your first dose.  You will come back to have your blood drawn to make sure the pregnancy hormones are dropping in 1 week. Please call us if you have any questions.   POSSIBLE SIDE EFFECTS FROM CYTOTEC . Nausea  Vomiting . Diarrhea Fever . Chills  Hot Flashes Side effects  from the process of the early pregnancy loss include: . Cramping  Bleeding . Headaches  Dizziness RISKS: This is a low risk procedure. Less than 1 in 100 women has  a complication. An incomplete passage of the early pregnancy may occur. Also, hemorrhage (heavy bleeding) could happen.  Rarely the pregnancy will not be passed completely. Excessively heavy bleeding may occur.  Your doctor may need to perform surgery to empty the uterus (D&E). Afterwards: Everybody will feel differently after the early pregnancy loss completion. You may have soreness or cramps for a day or two. You may have soreness or cramps for day or two.  You may have light bleeding for up to 2 weeks. You may be as active as you feel like being.  . If you have pain that does not get better with pain medication . Bleeding that soaks through 2 thick full-sized sanitary pads in an hour . Cramps that last longer than 2 days . Foul smelling discharge . Fever above 100.4 degrees F Even if you do not have any of these symptoms, you should have a follow-up exam to make sure you are  healing properly. Your next normal period will usually start again in 4-6 week after the loss. You can get pregnant soon after the loss, so use birth control right away. Finally: Make sure all your questions are answered before during and after any procedure. Follow up with medical care and family planning methods.

## 2017-01-25 NOTE — Progress Notes (Signed)
GYN ENCOUNTER NOTE  Subjective:       Adriana Lewis is a 21 y.o. 381P0000 female here for ultrasound review.   Adriana Lewis was seen earlier today by Dr. Logan BoresEvans for first trimester bleeding and an ultrasound was ordered. Please see his noted for further details of the visit.   Denies difficulty breathing or respiratory distress, chest pain, abdominal pain, dysuria, and leg pain or swelling.      Gynecologic History  Patient's last menstrual period was 10/06/2016 (exact date).  Last Pap: 2018. Results were: normal  Obstetric History  OB History  Gravida Para Term Preterm AB Living  1 0 0 0 0 0  SAB TAB Ectopic Multiple Live Births  0 0 0 0 0    # Outcome Date GA Lbr Len/2nd Weight Sex Delivery Anes PTL Lv  1 Current               Past Medical History:  Diagnosis Date  . Irregular heartbeat     No past surgical history on file.  Current Outpatient Prescriptions on File Prior to Visit  Medication Sig Dispense Refill  . Prenatal Vit-Fe Fumarate-FA (PRENATAL VITAMINS) 28-0.8 MG TABS Take by mouth.     No current facility-administered medications on file prior to visit.     Allergies  Allergen Reactions  . Latex   . Sulfa Antibiotics     Social History   Social History  . Marital status: Married    Spouse name: N/A  . Number of children: N/A  . Years of education: N/A   Occupational History  . Not on file.   Social History Main Topics  . Smoking status: Never Smoker  . Smokeless tobacco: Never Used  . Alcohol use No     Comment: rarely  . Drug use: No  . Sexual activity: Yes    Birth control/ protection: None     Comment: Pregnant    Other Topics Concern  . Not on file   Social History Narrative  . No narrative on file    Family History  Problem Relation Age of Onset  . Diabetes Mother   . Hypertension Mother   . Hepatitis B Father   . Cirrhosis Father   . Diabetes Sister   . Heart disease Maternal Grandmother   . Cancer Paternal  Grandmother        stomach  . Diabetes Paternal Grandfather     The following portions of the patient's history were reviewed and updated as appropriate: allergies, current medications, past family history, past medical history, past social history, past surgical history and problem list.  Review of Systems  Review of Systems - Negative except as noted above.  History obtained from the patient.   Objective:   CONSTITUTIONAL: Well-developed, well-nourished female in no acute distress; tearful   ULTRASOUND REPORT  Location: [facility] Date of Service: 01/23/17  Indications:Threatened AB Findings:  Singleton intrauterine pregnancy is visualized with a GSD consistent with 6 1/[redacted] weeks gestation, giving an (U/S) EDD of 09/17/17. The (U/S) EDD is NOT consistent with the clinically established (LMP) EDD of 08/22/17.  There is no evidence of fetal pole or yolk sac.  The gestational sac is irregular in shape.  Right Ovary measures 2.2 x 1.5 x 2.1 cm. It is normal in appearance. Left Ovary measures 2.2 x 1.4 x 1.4 cm. It is normal appearance. There is evidence of a corpus luteal cyst in the Right Survey of the adnexa demonstrates no adnexal  masses. There is no free peritoneal fluid in the cul de sac.  Impression: 1. 6 1/7 week Intrauterine pregnancy by U/S measurement of gestational sac. 2. (U/S) EDD is not consistent with Clinically established (LMP) EDD of 08/22/17. 3. No evidence of fetal pole.  Recommendations: 1.Clinical correlation with the patient's History and Physical Exam.   Assessment:   1. Threatened abortion  Plan:   Condolences offered.  Reviewed early pregnancy loss options including surgery, medicine, and waiting. Pt decided to wait.   Beta hCG, see orders.   Reviewed red flag symptoms and when to call.    Gunnar Bulla, CNM

## 2017-01-26 ENCOUNTER — Other Ambulatory Visit: Payer: Self-pay | Admitting: Certified Nurse Midwife

## 2017-01-26 ENCOUNTER — Encounter: Payer: Self-pay | Admitting: Certified Nurse Midwife

## 2017-01-26 ENCOUNTER — Telehealth: Payer: Self-pay | Admitting: Certified Nurse Midwife

## 2017-01-26 ENCOUNTER — Telehealth: Payer: Self-pay | Admitting: Obstetrics and Gynecology

## 2017-01-26 DIAGNOSIS — O200 Threatened abortion: Secondary | ICD-10-CM

## 2017-01-26 NOTE — Telephone Encounter (Signed)
-----   Message from Linzie Collinavid James Evans, MD sent at 01/26/2017  9:29 AM EDT ----- All results within normal range.

## 2017-01-26 NOTE — Telephone Encounter (Signed)
Called patient, verified full name and date of birth.   Discussed early pregnancy loss options as listed below:   FACTS YOU SHOULD KNOW  About Early Pregnancy Loss  WHAT IS AN EARLY PREGNANCY LOSS? Once the egg is fertilized with the sperm and begins to develop, it attaches to the lining of the uterus. This early pregnancy tissue may not develop into an embryo (the beginning stage of a baby). Sometimes an embryo does develop but does not continue to grow. These problems can be seen on ultrasound.   MANAGEMNT OF EARLY PREGNANCY LOSS: About 4 out of 100 (0.25%) women will have a pregnancy loss in her lifetime.  One in five pregnancies is found to be an early pregnancy loss.  There are 3 ways to care for an early pregnancy loss:   1. Surgery, (2) Medicine, (3) Waiting for you to pass the pregnancy on your own. The decision as to how to proceed after being diagnosed with and early pregnancy loss is an individual one.  The decision can be made only after appropriate counseling.  You need to weigh the pros and cons of the 3 choices. Then you can make the choice that works for you.  SURGERY (D&E)  Procedure over in 1 day  Requires being put to sleep  Bleeding may be light  Possible problems during surgery, including injury to womb(uterus)  Care provider has more control Medicine (CYTOTEC)  The complete procedure may take days to weeks  No Surgery  Bleeding may be heavy at times  There may be drug side effects  Patient has more control Waiting  You may choose to wait, in which case your own body may complete the passing of the abnormal early pregnancy on its own in about 2-4 weeks  Your bleeding may be heavy at times  There is a small possibility that you may need surgery if the bleeding is too much or not all of the pregnancy has passed.  CYTOTEC MANAGEMENT Prostaglandins (cytotec) are the most widely used drug for this purpose. They cause the uterus to cramp and contract.  You will place the medicine yourself inside your vagina in the privacy of your home. Empting of the uterus should occur within 3 days but the process may continue for several weeks. The bleeding may seem heavy at times.  INSTRUCTIONS: Take all 4 tablets of cytotec ( total) at one time. This will cause a lot of cramping, you may have bleeding, and pass tissue, then the cramping and bleeding should get better. If you do not pass the tissue, then you can take 4 more tablets of cytotec ( total) 48 hours after your first dose.  You will come back to have your blood drawn to make sure the pregnancy hormones are dropping in 1 week. Please call us if you have any questions.   POSSIBLE SIDE EFFECTS FROM CYTOTEC  Nausea      Vomiting  Diarrhea    Fever  Chills                     Hot Flashes Side effects  from the process of the early pregnancy loss include:  Cramping               Bleeding  Headaches                        Dizziness RISKS: This is a low risk procedure. Less than 1 in 100 women  has a complication. An incomplete passage of the early pregnancy may occur. Also, hemorrhage (heavy bleeding) could happen.  Rarely the pregnancy will not be passed completely. Excessively heavy bleeding may occur.  Your doctor may need to perform surgery to empty the uterus (D&E). Afterwards: Everybody will feel differently after the early pregnancy loss completion. You may have soreness or cramps for a day or two. You may have soreness or cramps for day or two.  You may have light bleeding for up to 2 weeks. You may be as active as you feel like being.   If you have pain that does not get better with pain medication  Bleeding that soaks through 2 thick full-sized sanitary pads in an hour  Cramps that last longer than 2 days  Foul smelling discharge  Fever above 100.4 degrees F Even if you do not have any of these symptoms, you should have a follow-up exam to make sure you are healing  properly. Your next normal period will usually start again in 4-6 week after the loss. You can get pregnant soon after the loss, so use birth control right away. Finally: Make sure all your questions are answered before during and after any procedure. Follow up with medical care and family planning methods.               Reviewed red flag, symptoms, and when to call. Pt will speak with husband and contact office with final decision.   Please contact with further needs, questions, and concerns.    Gunnar BullaJenkins Michelle Alexandria Current, CNM

## 2017-01-26 NOTE — Telephone Encounter (Signed)
Mychart message sent- normal results per provider.

## 2017-01-26 NOTE — Telephone Encounter (Signed)
Patient called stating she has not had any bleeding or passed any tissue over the weekend. Please Advise.

## 2017-01-29 ENCOUNTER — Ambulatory Visit (INDEPENDENT_AMBULATORY_CARE_PROVIDER_SITE_OTHER): Payer: BC Managed Care – PPO | Admitting: Obstetrics and Gynecology

## 2017-01-29 ENCOUNTER — Ambulatory Visit (INDEPENDENT_AMBULATORY_CARE_PROVIDER_SITE_OTHER): Payer: BC Managed Care – PPO

## 2017-01-29 ENCOUNTER — Other Ambulatory Visit: Payer: Self-pay | Admitting: Certified Nurse Midwife

## 2017-01-29 ENCOUNTER — Other Ambulatory Visit: Payer: BC Managed Care – PPO

## 2017-01-29 VITALS — BP 105/70 | HR 72 | Ht 68.0 in | Wt 232.4 lb

## 2017-01-29 DIAGNOSIS — O200 Threatened abortion: Secondary | ICD-10-CM | POA: Diagnosis not present

## 2017-01-29 DIAGNOSIS — Z01818 Encounter for other preprocedural examination: Secondary | ICD-10-CM

## 2017-01-29 DIAGNOSIS — O021 Missed abortion: Secondary | ICD-10-CM | POA: Diagnosis not present

## 2017-01-29 NOTE — H&P (Signed)
PRE-OPERATIVE HISTORY AND PHYSICAL EXAM  PCP:  Patient, No Pcp Per Subjective:   HPI:  Adriana Lewis is a 21 y.o. G1P0000.  Patient's last menstrual period was 10/06/2016 (exact date).  She presents today for a pre-op discussion and PE.  She has the following symptoms:  Vaginal spotting-ultrasound revealing anembryonic gestation.  Review of Systems:   Constitutional: Denied constitutional symptoms, night sweats, recent illness, fatigue, fever, insomnia and weight loss.  Eyes: Denied eye symptoms, eye pain, photophobia, vision change and visual disturbance.  Ears/Nose/Throat/Neck: Denied ear, nose, throat or neck symptoms, hearing loss, nasal discharge, sinus congestion and sore throat.  Cardiovascular: Denied cardiovascular symptoms, arrhythmia, chest pain/pressure, edema, exercise intolerance, orthopnea and palpitations.  Respiratory: Denied pulmonary symptoms, asthma, pleuritic pain, productive sputum, cough, dyspnea and wheezing.  Gastrointestinal: Denied, gastro-esophageal reflux, melena, nausea and vomiting.  Genitourinary: See HPI for additional information.  Musculoskeletal: Denied musculoskeletal symptoms, stiffness, swelling, muscle weakness and myalgia.  Dermatologic: Denied dermatology symptoms, rash and scar.  Neurologic: Denied neurology symptoms, dizziness, headache, neck pain and syncope.  Psychiatric: Denied psychiatric symptoms, anxiety and depression.  Endocrine: Denied endocrine symptoms including hot flashes and night sweats.   OB History  Gravida Para Term Preterm AB Living  1 0 0 0 0 0  SAB TAB Ectopic Multiple Live Births  0 0 0 0 0    # Outcome Date GA Lbr Len/2nd Weight Sex Delivery Anes PTL Lv  1 Current               Past Medical History:  Diagnosis Date  . Irregular heartbeat     No past surgical history on file.    SOCIAL HISTORY: History  Smoking Status  . Never Smoker  Smokeless Tobacco  . Never Used   History    Alcohol Use No    Comment: rarely   History  Drug Use No    Family History  Problem Relation Age of Onset  . Diabetes Mother   . Hypertension Mother   . Hepatitis B Father   . Cirrhosis Father   . Diabetes Sister   . Heart disease Maternal Grandmother   . Cancer Paternal Grandmother        stomach  . Diabetes Paternal Grandfather     ALLERGIES:  Latex and Sulfa antibiotics  MEDS:   Current Outpatient Prescriptions on File Prior to Visit  Medication Sig Dispense Refill  . Prenatal Vit-Fe Fumarate-FA (PRENATAL VITAMINS) 28-0.8 MG TABS Take by mouth.     No current facility-administered medications on file prior to visit.     No orders of the defined types were placed in this encounter.    Physical examination BP 105/70   Pulse 72   Ht 5\' 8"  (1.727 m)   Wt 232 lb 7 oz (105.4 kg)   LMP 10/06/2016 (Exact Date) Comment: irregular  BMI 35.34 kg/m   General NAD, Conversant  HEENT Atraumatic; Op clear with mmm.  Normo-cephalic. Pupils reactive. Anicteric sclerae  Thyroid/Neck Smooth without nodularity or enlargement. Normal ROM.  Neck Supple.  Skin No rashes, lesions or ulceration. Normal palpated skin turgor. No nodularity.  Breasts: No masses or discharge.  Symmetric.  No axillary adenopathy.  Lungs: Clear to auscultation.No rales or wheezes. Normal Respiratory effort, no retractions.  Heart: NSR.  No murmurs or rubs appreciated. No periferal edema  Abdomen: Soft.  Non-tender.  No masses.  No HSM. No hernia  Extremities: Moves all appropriately.  Normal ROM for  age. No lymphadenopathy.  Neuro: Oriented to PPT.  Normal mood. Normal affect.     Pelvic:   Vulva: Normal appearance.  No lesions.  Vagina: No lesions or abnormalities noted. Minimal vaginal bleeding   Support: Normal pelvic support.  Urethra No masses tenderness or scarring.  Meatus Normal size without lesions or prolapse.  Cervix: Normal ectropion.  No lesions.  Closed   Anus: Normal exam.  No  lesions.  Perineum: Normal exam.  No lesions.        Bimanual   Uterus: 8 weeks  Non-tender.  Mobile.  AV.  Adnexae: No masses.  Non-tender to palpation.  Cul-de-sac: Negative for abnormality.   Assessment:   G1P0000 Patient Active Problem List   Diagnosis Date Noted  . Abdominal cramping 12/22/2016  . Missed menses 12/10/2016  . 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 Ganglion and cyst of synovium, tendon, and bursa 09/25/2010    1. Preop examination   2. Missed ab    Patient with sac and lower uterine segment consistent with missed AB. No fetal pole. No change in 1 week.   Plan:   1.  D&E  Pre-op discussions regarding Risks and Benefits of her scheduled surgery.  D&C/E The procedure and the risks and benefits of dilation and curettage/evacuation have been explained to the patient.  The specific risks of bleeding, infection, anesthesia, uterine perforation, and damage to bowel or bladder  have been specifically discussed.  I have answered all of her questions and I believe that she has an adequate and informed understanding of this procedure. Patient has elected to undergo D&E for missed AB.  Elonda Huskyavid J. Nikolay Demetriou, M.D. 01/29/2017 2:43 PM

## 2017-01-29 NOTE — Progress Notes (Signed)
HPI:      [name] is a 21 y.o. G1P0000 who LMP was Patient's last menstrual period was 10/06/2016 (exact date).  Subjective:   She presents today After deciding that she would like a D&E at this time. She was going to try to have expected management for possible miscarriage but has now decided that D&E is her best option.    Hx: The following portions of the patient's history were reviewed and updated as appropriate:             She  has a past medical history of Irregular heartbeat. She  does not have any pertinent problems on file. She  has no past surgical history on file. Her family history includes Cancer in her paternal grandmother; Cirrhosis in her father; Diabetes in her mother, paternal grandfather, and sister; Heart disease in her maternal grandmother; Hepatitis B in her father; Hypertension in her mother. She  reports that she has never smoked. She has never used smokeless tobacco. She reports that she does not drink alcohol or use drugs. She is allergic to latex and sulfa antibiotics.       Review of Systems:  Review of Systems  Constitutional: Denied constitutional symptoms, night sweats, recent illness, fatigue, fever, insomnia and weight loss.  Eyes: Denied eye symptoms, eye pain, photophobia, vision change and visual disturbance.  Ears/Nose/Throat/Neck: Denied ear, nose, throat or neck symptoms, hearing loss, nasal discharge, sinus congestion and sore throat.  Cardiovascular: Denied cardiovascular symptoms, arrhythmia, chest pain/pressure, edema, exercise intolerance, orthopnea and palpitations.  Respiratory: Denied pulmonary symptoms, asthma, pleuritic pain, productive sputum, cough, dyspnea and wheezing.  Gastrointestinal: Denied, gastro-esophageal reflux, melena, nausea and vomiting.  Genitourinary: Denied genitourinary symptoms including symptomatic vaginal discharge, pelvic relaxation issues, and urinary complaints.  Musculoskeletal: Denied  musculoskeletal symptoms, stiffness, swelling, muscle weakness and myalgia.  Dermatologic: Denied dermatology symptoms, rash and scar.  Neurologic: Denied neurology symptoms, dizziness, headache, neck pain and syncope.  Psychiatric: Denied psychiatric symptoms, anxiety and depression.  Endocrine: Denied endocrine symptoms including hot flashes and night sweats.   Meds:   Current Outpatient Prescriptions on File Prior to Visit  Medication Sig Dispense Refill  . Prenatal Vit-Fe Fumarate-FA (PRENATAL VITAMINS) 28-0.8 MG TABS Take by mouth.     No current facility-administered medications on file prior to visit.     Objective:     Vitals:   01/29/17 1359  BP: 105/70  Pulse: 72              Please see H&P associated with preop  Assessment:    G1P0000 Patient Active Problem List   Diagnosis Date Noted  . Abdominal cramping 12/22/2016  . Missed menses 12/10/2016  . 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 Ganglion and cyst of synovium, tendon, and bursa 09/25/2010     1. Preop examination   2. Missed ab     ULTRASOUND revealing anembryonic gestation   Plan:            1.  D&E for missed AB. Orders No orders of the defined types were placed in this encounter.   No orders of the defined types were placed in this encounter.       F/U  Return in about 2 weeks (around 02/12/2017) for As Scheduled Post-op.  Elonda Huskyavid J. Herman Fiero, M.D. 01/29/2017 2:46 PM

## 2017-01-30 ENCOUNTER — Encounter
Admission: RE | Admit: 2017-01-30 | Discharge: 2017-01-30 | Disposition: A | Discharge status: Home / Self Care | Payer: BC Managed Care – PPO | Source: Ambulatory Visit | Attending: Obstetrics and Gynecology | Admitting: Obstetrics and Gynecology

## 2017-01-30 LAB — BETA HCG QUANT (REF LAB): hCG Quant: 6471 m[IU]/mL

## 2017-01-30 NOTE — Pre-Procedure Instructions (Signed)
Regarding pt's history of irregular heart rate, see test results from cardiac workup below.  Spoke with Dannette BarbaraMilissa Machia RN, no further testing indicated at this time. 2016 December EchocardiogramW Colorflow Spectral Doppler12/16/2016 [facility] Result Narrative      Transthoracic  Echo Report  [address], [address]  [place], [place] [address]  [phone]   Meliton RattanHOMAS, Joselyn Exam Date: 08/03/2015 12:57 Ordering Physician: Edythe ClarityOOPER, [place]  AREND  Age: 89 Gender: F Exam Location: [facility]_Echo Referring Physician: Vito Backersharles E Jahrsdorfer, MD  DOB: [birth year] Ht (in): 68 Wt (lb): 244 Reading Physician: Edythe Clarityandolph Cooper MD  MRN: [id number] Sonographer: Sandria Senterina Way, RDCS  Procedure CPT: [id number]  Indications: left ventricular systolic function Palpitaions/Tachycardia, Palpitations, Tachycardia, unspecified  ICD Codes: [id number]  Pt. History:   BP: 120 / 60 HR: Rhythm: Sinus    Technical Quality: Fair, Technically difficult study  IMPRESSIONS  1.Normal left ventricular size with normal left ventricular systolic function.Estimated ejection fraction greater than 55%.Normal  filling pressures.  2.Normal chamber sizes.  3.Normal morphology and function of all valve structures.No significant abnormalities.  4.Normal pericardium without effusion.  5.Normal aortic root and IVC.  FINDINGS  Left Ventricle Normal left ventricular size, wall thickness, systolic function with no obvious regional wall motion  abnormalities.The ejection fraction is visually estimated at 55-60%. Septal E/E' ratio is 7.9 indicating  normal filling pressure.  Right Ventricle The right ventricle is normal in size and function.  Right Atrium The right atrium is normal in size.  Left Atrium The left atrium is normal in size.  Mitral Valve The mitral valve is normal in mobility and thickness. There is no mitral annular calcification.No mitral  regurgitation.  Aortic Valve The aortic valve  appears normal. Three cusps are identified with normal mobility and function.No aortic  regurgitation.  Tricuspid Valve Normal appearance and motion of the tricuspid valve. Trace tricuspid regurgitation.  Pulmonic Valve Normal appearance and motion of the pulmonic valve. No pulmonic regurgitation.  Pericardium Normal pericardium without effusion.  Vessels Normal aortic root dimension.Inferior vena cava not visualized.   MEASUREMENTS (Female / Female) Normal Values  2D ECHO   LV Diastolic Diameter PLA4.9 cm4.2 - 5.9 / 3LV Relative Wall Thicknes0.4  LV Systolic Diameter PLAX3.0 cm RV Internal Dim ED PLAX2.7 cm  IVS Diastolic Thickness1.0 cm0.6 - 1.0 / 0LA Systolic Diameter LX3.9 cm3.0 - 4.0 / 2.7 - 3.8 cm  LVPW Diastolic Thickness 1.0 cm0.6 - 1.0 / 0.6 - 0.9 cm   DOPPLER   MV Area PHT3.1 cmMitral E to A Ratio1.8  Mitral E Point Velocity89.2 cm/sMV Deceleration Time 245.2 ms  Mitral A Point Velocity48.9 cm/sMitral E to LV E' Septal 7.9       Edythe Clarityandolph Cooper MD  (Electronically Signed)  Final Date: 03 August 2015 22:24  Status Results Details    Hospital Encounter on 08/03/2015 [facility]")' href="[email or web address]/">Encounter Summary  Exercise Treadmill Test (ETT)08/03/2015 [facility] Component Name Value Ref Range  Baseline HR 77 bpm   Baseline BP 123/69 mmHg   Peak HR 179 bpm   Percent target HR 90 %  Peak BP 151/49 mmHg   Target HR 179.1 bpm   Exercise duration (min) 6 min  Exercise duration (sec) 31 sec   Stage Reached 3   Estimated workload 7.7 METS   Angina Score for [facility] Treadmill Score 0   Result Narrative   No significant ST segment changes or arrhythmias were noted during  stress  Negative ETT for ischemia at  workload achieved  No significant chest pain symptoms reported  No significant arrhythmia noted  Reasonable chronotropic response.  Some evidence of sinus tachycardia with early onset and exercise  consistent with probable deconditioning. No evidence consistent with  inappropriate sinus tachycardia.

## 2017-01-30 NOTE — Patient Instructions (Signed)
  Your procedure is scheduled WU:JWJXBJon:Monday February 02, 2017. Report to Same Day Surgery.   Remember: Instructions that are not followed completely may result in serious medical risk, up to and including death, or upon the discretion of your surgeon and anesthesiologist your surgery may need to be rescheduled.    _x___ 1. Do not eat food or drink liquids after midnight. No gum chewing or hard candies.     ____ 2. No Alcohol for 24 hours before or after surgery.   ____ 3. Bring all medications with you on the day of surgery if instructed.    __x__ 4. Notify your doctor if there is any change in your medical condition     (cold, fever, infections).    _____ 5. No smoking 24 hours prior to surgery.     Do not wear jewelry, make-up, hairpins, clips or nail polish.  Do not wear lotions, powders, or perfumes.   Do not shave 48 hours prior to surgery. Men may shave face and neck.  Do not bring valuables to the hospital.    [facility] is not responsible for any belongings or valuables.               Contacts, dentures or bridgework may not be worn into surgery.  Leave your suitcase in the car. After surgery it may be brought to your room.  For patients admitted to the hospital, discharge time is determined by your treatment team.   Patients discharged the day of surgery will not be allowed to drive home.    Please read over the following fact sheets that you were given:   [facility] Preparing for [facility]  ____ Take these medicines the morning of surgery with A SIP OF WATER: None    ____ Fleet Enema (as directed)   ____ Use CHG Soap as directed on instruction sheet  ____ Use inhalers on the day of surgery and bring to hospital day of surgery  ____ Stop metformin 2 days prior to surgery    ____ Take 1/2 of usual insulin dose the night before surgery and none on the morning of surgery.   ____ Stop Coumadin/Plavix/aspirin on Does not apply.  __x__ Stop Anti-inflammatories such as  Advil, Aleve, Ibuprofen, Motrin, Naproxen, Naprosyn, Goodies powders or  aspirin products. OK to take Tylenol.   ____ Stop supplements until after surgery.    ____ Bring C-Pap to the hospital.

## 2017-02-02 ENCOUNTER — Ambulatory Visit: Payer: BC Managed Care – PPO | Admitting: Anesthesiology

## 2017-02-02 ENCOUNTER — Encounter
Admission: RE | Disposition: A | Discharge status: Home / Self Care | Payer: Self-pay | Source: Ambulatory Visit | Attending: Obstetrics and Gynecology

## 2017-02-02 ENCOUNTER — Ambulatory Visit
Admission: RE | Admit: 2017-02-02 | Discharge: 2017-02-02 | Disposition: A | Discharge status: Home / Self Care | Payer: BC Managed Care – PPO | Source: Ambulatory Visit | Attending: Obstetrics and Gynecology | Admitting: Obstetrics and Gynecology

## 2017-02-02 ENCOUNTER — Encounter: Payer: Self-pay | Admitting: Obstetrics and Gynecology

## 2017-02-02 ENCOUNTER — Encounter: Payer: Self-pay | Admitting: *Deleted

## 2017-02-02 DIAGNOSIS — O021 Missed abortion: Secondary | ICD-10-CM | POA: Diagnosis not present

## 2017-02-02 DIAGNOSIS — Z3A08 8 weeks gestation of pregnancy: Secondary | ICD-10-CM | POA: Diagnosis not present

## 2017-02-02 HISTORY — PX: DILATION AND EVACUATION: SHX1459

## 2017-02-02 LAB — CBC
HCT: 36.9 % (ref 35.0–47.0)
Hemoglobin: 12.9 g/dL (ref 12.0–16.0)
MCH: 30.6 pg (ref 26.0–34.0)
MCHC: 34.9 g/dL (ref 32.0–36.0)
MCV: 87.6 fL (ref 80.0–100.0)
Platelets: 220 10*3/uL (ref 150–440)
RBC: 4.22 MIL/uL (ref 3.80–5.20)
RDW: 12.9 % (ref 11.5–14.5)
WBC: 5.1 10*3/uL (ref 3.6–11.0)

## 2017-02-02 SURGERY — DILATION AND EVACUATION, UTERUS
Anesthesia: General | Wound class: Clean Contaminated

## 2017-02-02 MED ORDER — FENTANYL CITRATE (PF) 100 MCG/2ML IJ SOLN
25.0000 ug | INTRAMUSCULAR | Status: DC | PRN
  Administered 2017-02-02: 50 ug via INTRAVENOUS

## 2017-02-02 MED ORDER — ONDANSETRON HCL 4 MG/2ML IJ SOLN
INTRAMUSCULAR | Status: DC | PRN
  Administered 2017-02-02: 4 mg via INTRAVENOUS

## 2017-02-02 MED ORDER — FENTANYL CITRATE (PF) 100 MCG/2ML IJ SOLN
INTRAMUSCULAR | Status: DC | PRN
Start: 2017-02-02 — End: 2017-02-02
  Administered 2017-02-02: 25 ug via INTRAVENOUS
  Administered 2017-02-02: 50 ug via INTRAVENOUS
  Administered 2017-02-02: 25 ug via INTRAVENOUS

## 2017-02-02 MED ORDER — OXYCODONE HCL 5 MG/5ML PO SOLN: 5.0000 mg | Freq: Once | ORAL | Status: DC | PRN

## 2017-02-02 MED ORDER — FENTANYL CITRATE (PF) 100 MCG/2ML IJ SOLN
INTRAMUSCULAR | Status: AC
  Filled 2017-02-02: qty 2

## 2017-02-02 MED ORDER — FAMOTIDINE 20 MG PO TABS
20.0000 mg | ORAL_TABLET | Freq: Once | ORAL | Status: AC
  Administered 2017-02-02: 20 mg via ORAL

## 2017-02-02 MED ORDER — MIDAZOLAM HCL 2 MG/2ML IJ SOLN
INTRAMUSCULAR | Status: DC | PRN
  Administered 2017-02-02: 2 mg via INTRAVENOUS

## 2017-02-02 MED ORDER — OXYCODONE HCL 5 MG PO TABS: 5.0000 mg | ORAL_TABLET | Freq: Once | ORAL | Status: DC | PRN

## 2017-02-02 MED ORDER — PROMETHAZINE HCL 25 MG/ML IJ SOLN: 6.2500 mg | INTRAMUSCULAR | Status: DC | PRN

## 2017-02-02 MED ORDER — LIDOCAINE HCL (CARDIAC) 20 MG/ML IV SOLN
INTRAVENOUS | Status: DC | PRN
  Administered 2017-02-02: 50 mg via INTRAVENOUS

## 2017-02-02 MED ORDER — LIDOCAINE HCL (PF) 2 % IJ SOLN
INTRAMUSCULAR | Status: AC
  Filled 2017-02-02: qty 2

## 2017-02-02 MED ORDER — PROPOFOL 10 MG/ML IV BOLUS
INTRAVENOUS | Status: AC
Start: 2017-02-02 — End: ?
  Filled 2017-02-02: qty 20

## 2017-02-02 MED ORDER — OXYTOCIN 10 UNIT/ML IJ SOLN
INTRAMUSCULAR | Status: AC
  Filled 2017-02-02: qty 2

## 2017-02-02 MED ORDER — MIDAZOLAM HCL 2 MG/2ML IJ SOLN
INTRAMUSCULAR | Status: AC
  Filled 2017-02-02: qty 2

## 2017-02-02 MED ORDER — ONDANSETRON HCL 4 MG/2ML IJ SOLN
INTRAMUSCULAR | Status: AC
  Filled 2017-02-02: qty 2

## 2017-02-02 MED ORDER — LACTATED RINGERS IV SOLN: INTRAVENOUS | Status: DC

## 2017-02-02 MED ORDER — SEVOFLURANE IN SOLN
RESPIRATORY_TRACT | Status: AC
  Filled 2017-02-02: qty 250

## 2017-02-02 MED ORDER — MEPERIDINE HCL 50 MG/ML IJ SOLN: 6.2500 mg | INTRAMUSCULAR | Status: DC | PRN

## 2017-02-02 MED ORDER — OXYTOCIN 10 UNIT/ML IJ SOLN
INTRAMUSCULAR | Status: DC | PRN
  Administered 2017-02-02: 20 [IU] via INTRAMUSCULAR

## 2017-02-02 MED ORDER — LACTATED RINGERS IV SOLN
INTRAVENOUS | Status: DC
  Administered 2017-02-02: 09:00:00 via INTRAVENOUS

## 2017-02-02 MED ORDER — FENTANYL CITRATE (PF) 100 MCG/2ML IJ SOLN
INTRAMUSCULAR | Status: AC
  Administered 2017-02-02: 50 ug via INTRAVENOUS
  Filled 2017-02-02: qty 2

## 2017-02-02 MED ORDER — FAMOTIDINE 20 MG PO TABS
ORAL_TABLET | ORAL | Status: AC
  Administered 2017-02-02: 20 mg via ORAL
  Filled 2017-02-02: qty 1

## 2017-02-02 MED ORDER — PROPOFOL 10 MG/ML IV BOLUS
INTRAVENOUS | Status: DC | PRN
  Administered 2017-02-02: 180 mg via INTRAVENOUS

## 2017-02-02 MED ORDER — KETOROLAC TROMETHAMINE 30 MG/ML IJ SOLN
INTRAMUSCULAR | Status: DC | PRN
Start: 2017-02-02 — End: 2017-02-02
  Administered 2017-02-02: 30 mg via INTRAVENOUS

## 2017-02-02 SURGICAL SUPPLY — 23 items
ADAPTER VACURETTE TBG SET 14 (CANNULA) ×2 IMPLANT
CATH ROBINSON RED A/P 16FR (CATHETERS) ×2 IMPLANT
DRSG TELFA 3X8 NADH (GAUZE/BANDAGES/DRESSINGS) ×2 IMPLANT
FILTER UTR ASPR SPEC (MISCELLANEOUS) ×1 IMPLANT
FLTR UTR ASPR SPEC (MISCELLANEOUS) ×2
GLOVE ORTHO TXT STRL SZ7.5 (GLOVE) ×2 IMPLANT
GOWN STRL REUS W/ TWL LRG LVL3 (GOWN DISPOSABLE) ×1 IMPLANT
GOWN STRL REUS W/TWL LRG LVL3 (GOWN DISPOSABLE) ×1
KIT BERKELEY 1ST TRIMESTER 3/8 (MISCELLANEOUS) ×2 IMPLANT
KIT RM TURNOVER STRD PROC AR (KITS) ×2 IMPLANT
NEEDLE HYPO 25X1 1.5 SAFETY (NEEDLE) ×2 IMPLANT
PACK DNC HYST (MISCELLANEOUS) ×2 IMPLANT
PAD OB MATERNITY 4.3X12.25 (PERSONAL CARE ITEMS) ×2 IMPLANT
PAD PREP 24X41 OB/GYN DISP (PERSONAL CARE ITEMS) ×2 IMPLANT
SET BERKELEY SUCTION TUBING (SUCTIONS) ×2 IMPLANT
SOL PREP PVP 2OZ (MISCELLANEOUS) ×2
SOLUTION PREP PVP 2OZ (MISCELLANEOUS) ×1 IMPLANT
SPONGE XRAY 4X4 16PLY STRL (MISCELLANEOUS) ×2 IMPLANT
VACURETTE 10 RIGID CVD (CANNULA) ×2 IMPLANT
VACURETTE 12 RIGID CVD (CANNULA) IMPLANT
VACURETTE 7MM F TIP (CANNULA) ×1
VACURETTE 7MM F TIP STRL (CANNULA) ×1 IMPLANT
VACURETTE 8 RIGID CVD (CANNULA) IMPLANT

## 2017-02-02 NOTE — Anesthesia Preprocedure Evaluation (Signed)
Anesthesia Evaluation  Patient identified by MRN, date of birth, ID band Patient awake    Reviewed: Allergy & Precautions, NPO status , Patient's Chart, lab work & pertinent test results  History of Anesthesia Complications Negative for: history of anesthetic complications  Airway Mallampati: II  TM Distance: >3 FB Neck ROM: Full    Dental no notable dental hx.    Pulmonary neg pulmonary ROS, neg sleep apnea, neg COPD,    breath sounds clear to auscultation- rhonchi (-) wheezing      Cardiovascular Exercise Tolerance: Good (-) hypertension(-) CAD and (-) Past MI  Rhythm:Regular Rate:Normal - Systolic murmurs and - Diastolic murmurs    Neuro/Psych negative neurological ROS  negative psych ROS   GI/Hepatic negative GI ROS, Neg liver ROS,   Endo/Other  negative endocrine ROSneg diabetes  Renal/GU negative Renal ROS     Musculoskeletal negative musculoskeletal ROS (+)   Abdominal (+) + obese,   Peds  Hematology negative hematology ROS (+)   Anesthesia Other Findings   Reproductive/Obstetrics (+) Pregnancy (missed abortion)                             Anesthesia Physical Anesthesia Plan  ASA: II  Anesthesia Plan: General   Post-op Pain Management:    Induction: Intravenous  PONV Risk Score and Plan: 2 and Ondansetron, Dexamethasone and Propofol  Airway Management Planned: LMA  Additional Equipment:   Intra-op Plan:   Post-operative Plan:   Informed Consent: I have reviewed the patients History and Physical, chart, labs and discussed the procedure including the risks, benefits and alternatives for the proposed anesthesia with the patient or authorized representative who has indicated his/her understanding and acceptance.   Dental advisory given  Plan Discussed with: CRNA and Anesthesiologist  Anesthesia Plan Comments:         Anesthesia Quick Evaluation

## 2017-02-02 NOTE — Op Note (Signed)
@  LOGO@  OPERATIVE NOTE 02/02/2017 9:50 AM  PRE-OPERATIVE DIAGNOSIS:  1) MISSED ABORTION  POST-OPERATIVE DIAGNOSIS:  2) Same  OPERATION:  D&E  SURGEON(S): Surgeon(s) and Role:    Linzie Collin* Emi Lymon James, MD - Primary   ANESTHESIA: General  ESTIMATED BLOOD LOSS: 15ml  OPERATIVE FINDINGS: uterine POC  SPECIMEN:  ID Type Source Tests Collected by Time Destination  1 : Products of Conception [facility] SURGICAL PATHOLOGY Linzie CollinEvans, Eugene Zeiders James, MD 02/02/2017 [phone]     COMPLICATIONS: None  DRAINS: Foley to gravity  DISPOSITION: Stable to recovery room  DESCRIPTION OF PROCEDURE:      The patient was prepped and draped in the dorsal lithotomy position and placed under general anesthesia. Her cervix was grasped with a Jacob's tenaculum. Respecting the position and curvature of her cervix, it was dilated to accommodate a number 10 suction curette. The suction curette was placed within the endometrial cavity and a pressure greater than 65 mmHg was allowed to build. A systematic curettage was performed in all quadrants until no additional tissue was noted. The uterus became firm and globular. Pitocin was run in the IV. The tenaculum was removed from the cervix and hemostasis was noted. The weighted speculum was removed and the patient went to recovery room in stable condition.  No follow-up provider specified.  Elonda Huskyavid J. Cortavious Nix, M.D. 02/02/2017 9:50 AM

## 2017-02-02 NOTE — Discharge Instructions (Signed)
Dilation and Curettage or Vacuum Curettage, Care After °This sheet gives you information about how to care for yourself after your procedure. Your health care provider may also give you more specific instructions. If you have problems or questions, contact your health care provider. °What can I expect after the procedure? °After your procedure, it is common to have: °· Mild pain or cramping. °· Some vaginal bleeding or spotting. ° °These may last for up to 2 weeks after your procedure. °Follow these instructions at home: °Activity ° °· Do not drive or use heavy machinery while taking prescription pain medicine. °· Avoid driving for the first 24 hours after your procedure. °· Take frequent, short walks, followed by rest periods, throughout the day. Ask your health care provider what activities are safe for you. After 1-2 days, you may be able to return to your normal activities. °· Do not lift anything heavier than 10 lb (4.5 kg) until your health care provider approves. °· For at least 2 weeks, or as long as told by your health care provider, do not: °? Douche. °? Use tampons. °? Have sexual intercourse. °General instructions ° °· Take over-the-counter and prescription medicines only as told by your health care provider. This is especially important if you take blood thinning medicine. °· Do not take baths, swim, or use a hot tub until your health care provider approves. Take showers instead of baths. °· Wear compression stockings as told by your health care provider. These stockings help to prevent blood clots and reduce swelling in your legs. °· It is your responsibility to get the results of your procedure. Ask your health care provider, or the department performing the procedure, when your results will be ready. °· Keep all follow-up visits as told by your health care provider. This is important. °Contact a health care provider if: °· You have severe cramps that get worse or that do not get better with  medicine. °· You have severe abdominal pain. °· You cannot drink fluids without vomiting. °· You develop pain in a different area of your pelvis. °· You have bad-smelling vaginal discharge. °· You have a rash. °Get help right away if: °· You have vaginal bleeding that soaks more than one sanitary pad in 1 hour, for 2 hours in a row. °· You pass large blood clots from your vagina. °· You have a fever that is above 100.4°F (38.0°C). °· Your abdomen feels very tender or hard. °· You have chest pain. °· You have shortness of breath. °· You cough up blood. °· You feel dizzy or light-headed. °· You faint. °· You have pain in your neck or shoulder area. °This information is not intended to replace advice given to you by your health care provider. Make sure you discuss any questions you have with your health care provider. °Document Released: 08/01/2000 Document Revised: 04/02/2016 Document Reviewed: 03/06/2016 °Elsevier Interactive Patient Education © 2018 [employer] ° °

## 2017-02-02 NOTE — Anesthesia Procedure Notes (Signed)
Procedure Name: LMA Insertion Date/Time: 02/02/2017 9:21 AM Performed by: [name], Adriana Morneault Pre-anesthesia Checklist: Patient identified, Patient being monitored, Timeout performed, Emergency Drugs available and Suction available Patient Re-evaluated:Patient Re-evaluated prior to inductionOxygen Delivery Method: Circle system utilized Preoxygenation: Pre-oxygenation with 100% oxygen Intubation Type: IV induction Ventilation: Mask ventilation without difficulty LMA: LMA inserted LMA Size: 4.0 Tube type: Oral Number of attempts: 1 Placement Confirmation: positive ETCO2 and breath sounds checked- equal and bilateral Tube secured with: Tape Dental Injury: Teeth and Oropharynx as per pre-operative assessment

## 2017-02-02 NOTE — Anesthesia Postprocedure Evaluation (Signed)
Anesthesia Post Note  Patient: Adriana Lewis  Procedure(s) Performed: Procedure(s) (LRB): DILATATION AND EVACUATION (N/A)  Patient location during evaluation: PACU Anesthesia Type: General Level of consciousness: awake and alert and oriented Pain management: pain level controlled Vital Signs Assessment: post-procedure vital signs reviewed and stable Respiratory status: spontaneous breathing, nonlabored ventilation and respiratory function stable Cardiovascular status: blood pressure returned to baseline and stable Postop Assessment: no signs of nausea or vomiting Anesthetic complications: no     Last Vitals:  Vitals:   02/02/17 1011 02/02/17 1026  BP: 122/63 121/69  Pulse: 95 86  Resp: (!) 24 19  Temp:  36.7 C    Last Pain:  Vitals:   02/02/17 1026  TempSrc:   PainSc: 0-No pain                 Aaryav Hopfensperger

## 2017-02-02 NOTE — Addendum Note (Signed)
Addendum  created 02/02/17 1058 by [name], Rylynn Kobs, CRNA   Anesthesia Intra Meds edited

## 2017-02-02 NOTE — Interval H&P Note (Signed)
History and Physical Interval Note:  02/02/2017 9:11 AM  Adriana Lewis  has presented today for surgery, with the diagnosis of MISSED ABORTION  The various methods of treatment have been discussed with the patient and family. After consideration of risks, benefits and other options for treatment, the patient has consented to  Procedure(s): DILATATION AND EVACUATION (N/A) as a surgical intervention .  The patient's history has been reviewed, patient examined, no change in status, stable for surgery.  I have reviewed the patient's chart and labs.  Questions were answered to the patient's satisfaction.     Brennan Baileyavid Evans

## 2017-02-02 NOTE — H&P (View-Only) (Signed)
PRE-OPERATIVE HISTORY AND PHYSICAL EXAM  PCP:  Patient, No Pcp Per Subjective:   HPI:  Adriana Lewis is a 21 y.o. G1P0000.  Patient's last menstrual period was 10/06/2016 (exact date).  She presents today for a pre-op discussion and PE.  She has the following symptoms:  Vaginal spotting-ultrasound revealing anembryonic gestation.  Review of Systems:   Constitutional: Denied constitutional symptoms, night sweats, recent illness, fatigue, fever, insomnia and weight loss.  Eyes: Denied eye symptoms, eye pain, photophobia, vision change and visual disturbance.  Ears/Nose/Throat/Neck: Denied ear, nose, throat or neck symptoms, hearing loss, nasal discharge, sinus congestion and sore throat.  Cardiovascular: Denied cardiovascular symptoms, arrhythmia, chest pain/pressure, edema, exercise intolerance, orthopnea and palpitations.  Respiratory: Denied pulmonary symptoms, asthma, pleuritic pain, productive sputum, cough, dyspnea and wheezing.  Gastrointestinal: Denied, gastro-esophageal reflux, melena, nausea and vomiting.  Genitourinary: See HPI for additional information.  Musculoskeletal: Denied musculoskeletal symptoms, stiffness, swelling, muscle weakness and myalgia.  Dermatologic: Denied dermatology symptoms, rash and scar.  Neurologic: Denied neurology symptoms, dizziness, headache, neck pain and syncope.  Psychiatric: Denied psychiatric symptoms, anxiety and depression.  Endocrine: Denied endocrine symptoms including hot flashes and night sweats.   OB History  Gravida Para Term Preterm AB Living  1 0 0 0 0 0  SAB TAB Ectopic Multiple Live Births  0 0 0 0 0    # Outcome Date GA Lbr Len/2nd Weight Sex Delivery Anes PTL Lv  1 Current               Past Medical History:  Diagnosis Date  . Irregular heartbeat     No past surgical history on file.    SOCIAL HISTORY: History  Smoking Status  . Never Smoker  Smokeless Tobacco  . Never Used   History    Alcohol Use No    Comment: rarely   History  Drug Use No    Family History  Problem Relation Age of Onset  . Diabetes Mother   . Hypertension Mother   . Hepatitis B Father   . Cirrhosis Father   . Diabetes Sister   . Heart disease Maternal Grandmother   . Cancer Paternal Grandmother        stomach  . Diabetes Paternal Grandfather     ALLERGIES:  Latex and Sulfa antibiotics  MEDS:   Current Outpatient Prescriptions on File Prior to Visit  Medication Sig Dispense Refill  . Prenatal Vit-Fe Fumarate-FA (PRENATAL VITAMINS) 28-0.8 MG TABS Take by mouth.     No current facility-administered medications on file prior to visit.     No orders of the defined types were placed in this encounter.    Physical examination BP 105/70   Pulse 72   Ht 5\' 8"  (1.727 m)   Wt 232 lb 7 oz (105.4 kg)   LMP 10/06/2016 (Exact Date) Comment: irregular  BMI 35.34 kg/m   General NAD, Conversant  HEENT Atraumatic; Op clear with mmm.  Normo-cephalic. Pupils reactive. Anicteric sclerae  Thyroid/Neck Smooth without nodularity or enlargement. Normal ROM.  Neck Supple.  Skin No rashes, lesions or ulceration. Normal palpated skin turgor. No nodularity.  Breasts: No masses or discharge.  Symmetric.  No axillary adenopathy.  Lungs: Clear to auscultation.No rales or wheezes. Normal Respiratory effort, no retractions.  Heart: NSR.  No murmurs or rubs appreciated. No periferal edema  Abdomen: Soft.  Non-tender.  No masses.  No HSM. No hernia  Extremities: Moves all appropriately.  Normal ROM for  age. No lymphadenopathy.  Neuro: Oriented to PPT.  Normal mood. Normal affect.     Pelvic:   Vulva: Normal appearance.  No lesions.  Vagina: No lesions or abnormalities noted. Minimal vaginal bleeding   Support: Normal pelvic support.  Urethra No masses tenderness or scarring.  Meatus Normal size without lesions or prolapse.  Cervix: Normal ectropion.  No lesions.  Closed   Anus: Normal exam.  No  lesions.  Perineum: Normal exam.  No lesions.        Bimanual   Uterus: 8 weeks  Non-tender.  Mobile.  AV.  Adnexae: No masses.  Non-tender to palpation.  Cul-de-sac: Negative for abnormality.   Assessment:   G1P0000 Patient Active Problem List   Diagnosis Date Noted  . Abdominal cramping 12/22/2016  . Missed menses 12/10/2016  . 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 Ganglion and cyst of synovium, tendon, and bursa 09/25/2010    1. Preop examination   2. Missed ab    Patient with sac and lower uterine segment consistent with missed AB. No fetal pole. No change in 1 week.   Plan:   1.  D&E  Pre-op discussions regarding Risks and Benefits of her scheduled surgery.  D&C/E The procedure and the risks and benefits of dilation and curettage/evacuation have been explained to the patient.  The specific risks of bleeding, infection, anesthesia, uterine perforation, and damage to bowel or bladder  have been specifically discussed.  I have answered all of her questions and I believe that she has an adequate and informed understanding of this procedure. Patient has elected to undergo D&E for missed AB.  Elonda Huskyavid J. Evans, M.D. 01/29/2017 2:43 PM

## 2017-02-02 NOTE — Transfer of Care (Signed)
Immediate Anesthesia Transfer of Care Note  Patient: Adriana Lewis  Procedure(s) Performed: Procedure(s): DILATATION AND EVACUATION (N/A)  Patient Location: PACU  Anesthesia Type:General  Level of Consciousness: drowsy and patient cooperative  Airway & Oxygen Therapy: Patient Spontanous Breathing and Patient connected to face mask oxygen  Post-op Assessment: Report given to RN and Post -op Vital signs reviewed and stable  Post vital signs: Reviewed and stable  Last Vitals:  Vitals:   02/02/17 0810 02/02/17 0956  BP: 118/83 (!) 118/54  Pulse: 91   Resp: 18   Temp: 36.6 C 37.1 C    Last Pain:  Vitals:   02/02/17 0810  TempSrc: Oral         Complications: No apparent anesthesia complications

## 2017-02-02 NOTE — Anesthesia Post-op Follow-up Note (Cosign Needed)
Anesthesia QCDR form completed.        

## 2017-02-03 ENCOUNTER — Encounter: Payer: Self-pay | Admitting: Obstetrics and Gynecology

## 2017-02-03 LAB — SURGICAL PATHOLOGY

## 2017-02-10 ENCOUNTER — Encounter: Payer: BC Managed Care – PPO | Admitting: Obstetrics and Gynecology

## 2017-02-11 ENCOUNTER — Ambulatory Visit (INDEPENDENT_AMBULATORY_CARE_PROVIDER_SITE_OTHER): Payer: BC Managed Care – PPO | Admitting: Obstetrics and Gynecology

## 2017-02-11 ENCOUNTER — Encounter: Payer: Self-pay | Admitting: Obstetrics and Gynecology

## 2017-02-11 VITALS — BP 97/63 | HR 76 | Ht 68.0 in | Wt 231.9 lb

## 2017-02-11 DIAGNOSIS — Z9889 Other specified postprocedural states: Secondary | ICD-10-CM

## 2017-02-11 DIAGNOSIS — Z3009 Encounter for other general counseling and advice on contraception: Secondary | ICD-10-CM

## 2017-02-11 DIAGNOSIS — Z8759 Personal history of other complications of pregnancy, childbirth and the puerperium: Secondary | ICD-10-CM

## 2017-02-11 NOTE — Progress Notes (Signed)
    OBSTETRICS/GYNECOLOGY POST-OPERATIVE CLINIC VISIT  Subjective:     Adriana Lewis is a 21 y.o. [id number]  female who presents to the clinic 1 weeks status post Dilation and Evacuation for missed abortion at [redacted] weeks gestation. Eating a regular diet without difficulty. Bowel movements are normal. Pain is controlled without any medications.  Notes that she is not having any bleeding or cramping.  Notes emotionally and physically she is doing "ok".  States that this was unplanned pregnancy while on OCPs so she is able to cope much better.   The following portions of the patient's history were reviewed and updated as appropriate: allergies, current medications, past family history, past medical history, past social history, past surgical history and problem list.  Review of Systems Pertinent items noted in HPI and remainder of comprehensive ROS otherwise negative.    Objective:    BP 97/63 (BP Location: Left Arm, Patient Position: Sitting, Cuff Size: Normal)   Pulse 76   Ht 5\' 8"  (1.727 m)   Wt 231 lb 14.4 oz (105.2 kg)   LMP 10/06/2016 (Exact Date) Comment: irregular  Breastfeeding? Unknown   BMI 35.26 kg/m  General:  alert and no distress  Abdomen: soft, bowel sounds active, non-tender  Pelvis:   deferred    Pathology:  DIAGNOSIS:  A. PRODUCTS OF CONCEPTION; DILATATION AND EVACUATION:  - CHORIONIC VILLI AND DECIDUA CONSISTENT WITH PRODUCTS OF CONCEPTION.    Assessment:    Doing well postoperatively. S/p D&E  Contraception counseling   Plan:   1. Continue any current medications. 2. Pathology report discussed. 3. Activity restrictions: none 4. Anticipated return to work: now. Can return at full duty. 5. Discussed contraception with patient.  Was previously on combined OCPs. Notes that she would like to consider an IUD.  Discussed risks/benefits, and different IUD types available. [facility].  Given handouts on both.  RTC in 2-3 weeks for  insertion. Advised that menstrual may or may not resume by this time.  Counseled on barrier method until IUD received, and to maintain abstinence for at least 1 week prior to insertion.     Hildred Laserherry, Lashayla Armes, MD [facility]

## 2017-03-05 ENCOUNTER — Encounter: Payer: BC Managed Care – PPO | Admitting: Certified Nurse Midwife

## 2017-03-13 ENCOUNTER — Ambulatory Visit (INDEPENDENT_AMBULATORY_CARE_PROVIDER_SITE_OTHER): Payer: BC Managed Care – PPO | Admitting: Certified Nurse Midwife

## 2017-03-13 ENCOUNTER — Encounter: Payer: Self-pay | Admitting: Certified Nurse Midwife

## 2017-03-13 VITALS — BP 122/78 | HR 97 | Ht 68.0 in | Wt 231.1 lb

## 2017-03-13 DIAGNOSIS — Z7689 Persons encountering health services in other specified circumstances: Secondary | ICD-10-CM

## 2017-03-13 DIAGNOSIS — Z30430 Encounter for insertion of intrauterine contraceptive device: Secondary | ICD-10-CM | POA: Diagnosis not present

## 2017-03-13 LAB — POCT URINE PREGNANCY: Preg Test, Ur: NEGATIVE

## 2017-03-13 MED ORDER — LEVONORGESTREL 20 MCG/24HR IU IUD
INTRAUTERINE_SYSTEM | Freq: Once | INTRAUTERINE | Status: AC
  Administered 2017-03-13: 16:00:00 via INTRAUTERINE

## 2017-03-13 NOTE — Progress Notes (Signed)
Pt is here for a Mirena IUD insertion. Informed consent signed. LMP 03/10/17.

## 2017-03-13 NOTE — Progress Notes (Signed)
Adriana Lewis is a 21 y.o. year old [id number] Caucasian female who presents for placement of a Mirena IUD.  Patient's last menstrual period was 03/10/2017 (exact date). BP 122/78   Pulse 97   Ht 5\' 8"  (1.727 m)   Wt 231 lb 1 oz (104.8 kg)   LMP 03/10/2017 (Exact Date)   BMI 35.13 kg/m    Last sexual intercourse was last Saturday, and pregnancy test today was negative.  The risks and benefits of the method and placement have been thouroughly reviewed with the patient and all questions were answered. Specifically the patient is aware of failure rate of 08/998, expulsion of the IUD and of possible perforation.  The patient is aware of irregular bleeding due to the method and understands the incidence of irregular bleeding diminishes with time.  Signed copy of informed consent in chart.   Time out was performed.  A large plastic speculum was placed in the vagina.  The cervix was visualized, prepped using Betadine, and grasped with a single tooth tenaculum. The uterus was found to be anteroflexed and it sounded to 6.5 cm.  Mirena IUD placed per manufacturer's recommendations.   The strings were trimmed to 3 cm.  The patient was given post procedure instructions, including signs and symptoms of infection and to check for the strings after each menses or each month, and refraining from intercourse or anything in the vagina for 3 days.  She was given a Mirena care card with date Mirena placed, and date Mirena to be removed.    Gunnar BullaJenkins Michelle Hanni Milford, CNM

## 2017-03-13 NOTE — Patient Instructions (Signed)
Intrauterine Device Insertion, Care After This sheet gives you information about how to care for yourself after your procedure. Your health care provider may also give you more specific instructions. If you have problems or questions, contact your health care provider. What can I expect after the procedure? After the procedure, it is common to have:  Cramps and pain in the abdomen.  Light bleeding (spotting) or heavier bleeding that is like your menstrual period. This may last for up to a few days.  Lower back pain.  Dizziness.  Headaches.  Nausea.  Follow these instructions at home:  Before resuming sexual activity, check to make sure that you can feel the IUD string(s). You should be able to feel the end of the string(s) below the opening of your cervix. If your IUD string is in place, you may resume sexual activity. ? If you had a hormonal IUD inserted more than 7 days after your most recent period started, you will need to use a backup method of birth control for 7 days after IUD insertion. Ask your health care provider whether this applies to you.  Continue to check that the IUD is still in place by feeling for the string(s) after every menstrual period, or once a month.  Take over-the-counter and prescription medicines only as told by your health care provider.  Do not drive or use heavy machinery while taking prescription pain medicine.  Keep all follow-up visits as told by your health care provider. This is important. Contact a health care provider if:  You have bleeding that is heavier or lasts longer than a normal menstrual cycle.  You have a fever.  You have cramps or abdominal pain that get worse or do not get better with medicine.  You develop abdominal pain that is new or is not in the same area of earlier cramping and pain.  You feel lightheaded or weak.  You have abnormal or bad-smelling discharge from your vagina.  You have pain during sexual  activity.  You have any of the following problems with your IUD string(s): ? The string bothers or hurts you or your sexual partner. ? You cannot feel the string. ? The string has gotten longer.  You can feel the IUD in your vagina.  You think you may be pregnant, or you miss your menstrual period.  You think you may have an STI (sexually transmitted infection). Get help right away if:  You have flu-like symptoms.  You have a fever and chills.  You can feel that your IUD has slipped out of place. Summary  After the procedure, it is common to have cramps and pain in the abdomen. It is also common to have light bleeding (spotting) or heavier bleeding that is like your menstrual period.  Continue to check that the IUD is still in place by feeling for the string(s) after every menstrual period, or once a month.  Keep all follow-up visits as told by your health care provider. This is important.  Contact your health care provider if you have problems with your IUD string(s), such as the string getting longer or bothering you or your sexual partner. This information is not intended to replace advice given to you by your health care provider. Make sure you discuss any questions you have with your health care provider. Document Released: 04/02/2011 Document Revised: 06/25/2016 Document Reviewed: 06/25/2016 Elsevier Interactive Patient Education  2017 [employer] Levonorgestrel intrauterine device (IUD) What is this medicine? LEVONORGESTREL IUD (LEE voe nor   jes trel) is a contraceptive (birth control) device. The device is placed inside the uterus by a healthcare professional. It is used to prevent pregnancy. This device can also be used to treat heavy bleeding that occurs during your period. This medicine may be used for other purposes; ask your health care provider or pharmacist if you have questions. COMMON BRAND NAME(S): Kyleena, LILETTA, Mirena, Skyla What should I tell my health  care provider before I take this medicine? They need to know if you have any of these conditions: -abnormal Pap smear -cancer of the breast, uterus, or cervix -diabetes -endometritis -genital or pelvic infection now or in the past -have more than one sexual partner or your partner has more than one partner -heart disease -history of an ectopic or tubal pregnancy -immune system problems -IUD in place -liver disease or tumor -problems with blood clots or take blood-thinners -seizures -use intravenous drugs -uterus of unusual shape -vaginal bleeding that has not been explained -an unusual or allergic reaction to levonorgestrel, other hormones, silicone, or polyethylene, medicines, foods, dyes, or preservatives -pregnant or trying to get pregnant -breast-feeding How should I use this medicine? This device is placed inside the uterus by a health care professional. Talk to your pediatrician regarding the use of this medicine in children. Special care may be needed. Overdosage: If you think you have taken too much of this medicine contact a poison control center or emergency room at once. NOTE: This medicine is only for you. Do not share this medicine with others. What if I miss a dose? This does not apply. Depending on the brand of device you have inserted, the device will need to be replaced every 3 to 5 years if you wish to continue using this type of birth control. What may interact with this medicine? Do not take this medicine with any of the following medications: -amprenavir -bosentan -fosamprenavir This medicine may also interact with the following medications: -aprepitant -armodafinil -barbiturate medicines for inducing sleep or treating seizures -bexarotene -boceprevir -griseofulvin -medicines to treat seizures like carbamazepine, ethotoin, felbamate, oxcarbazepine, phenytoin, topiramate -modafinil -pioglitazone -rifabutin -rifampin -rifapentine -some medicines to  treat HIV infection like atazanavir, efavirenz, indinavir, lopinavir, nelfinavir, tipranavir, ritonavir -St. John's wort -warfarin This list may not describe all possible interactions. Give your health care provider a list of all the medicines, herbs, non-prescription drugs, or dietary supplements you use. Also tell them if you smoke, drink alcohol, or use illegal drugs. Some items may interact with your medicine. What should I watch for while using this medicine? Visit your doctor or health care professional for regular check ups. See your doctor if you or your partner has sexual contact with others, becomes HIV positive, or gets a sexual transmitted disease. This product does not protect you against HIV infection (AIDS) or other sexually transmitted diseases. You can check the placement of the IUD yourself by reaching up to the top of your vagina with clean fingers to feel the threads. Do not pull on the threads. It is a good habit to check placement after each menstrual period. Call your doctor right away if you feel more of the IUD than just the threads or if you cannot feel the threads at all. The IUD may come out by itself. You may become pregnant if the device comes out. If you notice that the IUD has come out use a backup birth control method like condoms and call your health care provider. Using tampons will not change the position of the   IUD and are okay to use during your period. This IUD can be safely scanned with magnetic resonance imaging (MRI) only under specific conditions. Before you have an MRI, tell your healthcare provider that you have an IUD in place, and which type of IUD you have in place. What side effects may I notice from receiving this medicine? Side effects that you should report to your doctor or health care professional as soon as possible: -allergic reactions like skin rash, itching or hives, swelling of the face, lips, or tongue -fever, flu-like symptoms -genital  sores -high blood pressure -no menstrual period for 6 weeks during use -pain, swelling, warmth in the leg -pelvic pain or tenderness -severe or sudden headache -signs of pregnancy -stomach cramping -sudden shortness of breath -trouble with balance, talking, or walking -unusual vaginal bleeding, discharge -yellowing of the eyes or skin Side effects that usually do not require medical attention (report to your doctor or health care professional if they continue or are bothersome): -acne -breast pain -change in sex drive or performance -changes in weight -cramping, dizziness, or faintness while the device is being inserted -headache -irregular menstrual bleeding within first 3 to 6 months of use -nausea This list may not describe all possible side effects. Call your doctor for medical advice about side effects. You may report side effects to FDA at 1-800-FDA-1088. Where should I keep my medicine? This does not apply. NOTE: This sheet is a summary. It may not cover all possible information. If you have questions about this medicine, talk to your doctor, pharmacist, or health care provider.  2018 Elsevier/Gold Standard (2016-05-16 14:14:56)  

## 2017-03-16 ENCOUNTER — Telehealth: Payer: Self-pay | Admitting: Certified Nurse Midwife

## 2017-03-16 NOTE — Telephone Encounter (Signed)
Patient called stating she has developed a UTI. She was just seen Friday. [facility] uses [facility] in [place]. Thanks

## 2017-03-16 NOTE — Telephone Encounter (Signed)
pls advise

## 2017-03-16 NOTE — Telephone Encounter (Signed)
She can have macrobid 100 mg BID x 7 days and take AZO OTC as needed. Follow up in three (3) days if symptoms worsen or persist. Thanks, JML

## 2017-03-17 ENCOUNTER — Other Ambulatory Visit: Payer: Self-pay | Admitting: *Deleted

## 2017-03-17 MED ORDER — NITROFURANTOIN MONOHYD MACRO 100 MG PO CAPS: 100.0000 mg | ORAL_CAPSULE | Freq: Two times a day (BID) | ORAL | 0 refills | Status: DC

## 2017-03-17 NOTE — Telephone Encounter (Signed)
°  Patient called stating that she left a message yesterday, And is waiting on her prescription to be filled. Patient would like a call back. Patient was informed that it takes up to 24 hrs to respond to messages. Please advise.

## 2017-03-17 NOTE — Telephone Encounter (Signed)
rx sent in for pt.  

## 2017-05-01 ENCOUNTER — Encounter: Payer: BC Managed Care – PPO | Admitting: Certified Nurse Midwife

## 2017-05-05 ENCOUNTER — Encounter: Payer: Self-pay | Admitting: Certified Nurse Midwife

## 2017-05-05 ENCOUNTER — Ambulatory Visit (INDEPENDENT_AMBULATORY_CARE_PROVIDER_SITE_OTHER): Payer: BC Managed Care – PPO | Admitting: Certified Nurse Midwife

## 2017-05-05 VITALS — BP 128/78 | HR 76 | Wt 235.4 lb

## 2017-05-05 DIAGNOSIS — Z30431 Encounter for routine checking of intrauterine contraceptive device: Secondary | ICD-10-CM

## 2017-05-05 NOTE — Patient Instructions (Signed)
Preventive Care 18-39 Years, Female Preventive care refers to lifestyle choices and visits with your health care provider that can promote health and wellness. What does preventive care include?  A yearly physical exam. This is also called an annual well check.  Dental exams once or twice a year.  Routine eye exams. Ask your health care provider how often you should have your eyes checked.  Personal lifestyle choices, including: ? Daily care of your teeth and gums. ? Regular physical activity. ? Eating a healthy diet. ? Avoiding tobacco and drug use. ? Limiting alcohol use. ? Practicing safe sex. ? Taking vitamin and mineral supplements as recommended by your health care provider. What happens during an annual well check? The services and screenings done by your health care provider during your annual well check will depend on your age, overall health, lifestyle risk factors, and family history of disease. Counseling Your health care provider may ask you questions about your:  Alcohol use.  Tobacco use.  Drug use.  Emotional well-being.  Home and relationship well-being.  Sexual activity.  Eating habits.  Work and work Statistician.  Method of birth control.  Menstrual cycle.  Pregnancy history.  Screening You may have the following tests or measurements:  Height, weight, and BMI.  Diabetes screening. This is done by checking your blood sugar (glucose) after you have not eaten for a while (fasting).  Blood pressure.  Lipid and cholesterol levels. These may be checked every 5 years starting at age 66.  Skin check.  Hepatitis C blood test.  Hepatitis B blood test.  Sexually transmitted disease (STD) testing.  BRCA-related cancer screening. This may be done if you have a family history of breast, ovarian, tubal, or peritoneal cancers.  Pelvic exam and Pap test. This may be done every 3 years starting at age 40. Starting at age 59, this may be done every 5  years if you have a Pap test in combination with an HPV test.  Discuss your test results, treatment options, and if necessary, the need for more tests with your health care provider. Vaccines Your health care provider may recommend certain vaccines, such as:  Influenza vaccine. This is recommended every year.  Tetanus, diphtheria, and acellular pertussis (Tdap, Td) vaccine. You may need a Td booster every 10 years.  Varicella vaccine. You may need this if you have not been vaccinated.  HPV vaccine. If you are 69 or younger, you may need three doses over 6 months.  Measles, mumps, and rubella (MMR) vaccine. You may need at least one dose of MMR. You may also need a second dose.  Pneumococcal 13-valent conjugate (PCV13) vaccine. You may need this if you have certain conditions and were not previously vaccinated.  Pneumococcal polysaccharide (PPSV23) vaccine. You may need one or two doses if you smoke cigarettes or if you have certain conditions.  Meningococcal vaccine. One dose is recommended if you are age 27-21 years and a first-year college student living in a residence hall, or if you have one of several medical conditions. You may also need additional booster doses.  Hepatitis A vaccine. You may need this if you have certain conditions or if you travel or work in places where you may be exposed to hepatitis A.  Hepatitis B vaccine. You may need this if you have certain conditions or if you travel or work in places where you may be exposed to hepatitis B.  Haemophilus influenzae type b (Hib) vaccine. You may need this if  you have certain risk factors.  Talk to your health care provider about which screenings and vaccines you need and how often you need them. This information is not intended to replace advice given to you by your health care provider. Make sure you discuss any questions you have with your health care provider. Document Released: 09/30/2001 Document Revised: 04/23/2016  Document Reviewed: 06/05/2015 Elsevier Interactive Patient Education  2017 [employer]. Levonorgestrel intrauterine device (IUD) What is this medicine? LEVONORGESTREL IUD (LEE voe nor jes trel) is a contraceptive (birth control) device. The device is placed inside the uterus by a healthcare professional. It is used to prevent pregnancy. This device can also be used to treat heavy bleeding that occurs during your period. This medicine may be used for other purposes; ask your health care provider or pharmacist if you have questions. COMMON BRAND NAME(S): Minette Headland What should I tell my health care provider before I take this medicine? They need to know if you have any of these conditions: -abnormal Pap smear -cancer of the breast, uterus, or cervix -diabetes -endometritis -genital or pelvic infection now or in the past -have more than one sexual partner or your partner has more than one partner -heart disease -history of an ectopic or tubal pregnancy -immune system problems -IUD in place -liver disease or tumor -problems with blood clots or take blood-thinners -seizures -use intravenous drugs -uterus of unusual shape -vaginal bleeding that has not been explained -an unusual or allergic reaction to levonorgestrel, other hormones, silicone, or polyethylene, medicines, foods, dyes, or preservatives -pregnant or trying to get pregnant -breast-feeding How should I use this medicine? This device is placed inside the uterus by a health care professional. Talk to your pediatrician regarding the use of this medicine in children. Special care may be needed. Overdosage: If you think you have taken too much of this medicine contact a poison control center or emergency room at once. NOTE: This medicine is only for you. Do not share this medicine with others. What if I miss a dose? This does not apply. Depending on the brand of device you have inserted, the device will need  to be replaced every 3 to 5 years if you wish to continue using this type of birth control. What may interact with this medicine? Do not take this medicine with any of the following medications: -amprenavir -bosentan -fosamprenavir This medicine may also interact with the following medications: -aprepitant -armodafinil -barbiturate medicines for inducing sleep or treating seizures -bexarotene -boceprevir -griseofulvin -medicines to treat seizures like carbamazepine, ethotoin, felbamate, oxcarbazepine, phenytoin, topiramate -modafinil -pioglitazone -rifabutin -rifampin -rifapentine -some medicines to treat HIV infection like atazanavir, efavirenz, indinavir, lopinavir, nelfinavir, tipranavir, ritonavir -St. John's wort -warfarin This list may not describe all possible interactions. Give your health care provider a list of all the medicines, herbs, non-prescription drugs, or dietary supplements you use. Also tell them if you smoke, drink alcohol, or use illegal drugs. Some items may interact with your medicine. What should I watch for while using this medicine? Visit your doctor or health care professional for regular check ups. See your doctor if you or your partner has sexual contact with others, becomes HIV positive, or gets a sexual transmitted disease. This product does not protect you against HIV infection (AIDS) or other sexually transmitted diseases. You can check the placement of the IUD yourself by reaching up to the top of your vagina with clean fingers to feel the threads. Do not pull on the threads. It  is a good habit to check placement after each menstrual period. Call your doctor right away if you feel more of the IUD than just the threads or if you cannot feel the threads at all. The IUD may come out by itself. You may become pregnant if the device comes out. If you notice that the IUD has come out use a backup birth control method like condoms and call your health care  provider. Using tampons will not change the position of the IUD and are okay to use during your period. This IUD can be safely scanned with magnetic resonance imaging (MRI) only under specific conditions. Before you have an MRI, tell your healthcare provider that you have an IUD in place, and which type of IUD you have in place. What side effects may I notice from receiving this medicine? Side effects that you should report to your doctor or health care professional as soon as possible: -allergic reactions like skin rash, itching or hives, swelling of the face, lips, or tongue -fever, flu-like symptoms -genital sores -high blood pressure -no menstrual period for 6 weeks during use -pain, swelling, warmth in the leg -pelvic pain or tenderness -severe or sudden headache -signs of pregnancy -stomach cramping -sudden shortness of breath -trouble with balance, talking, or walking -unusual vaginal bleeding, discharge -yellowing of the eyes or skin Side effects that usually do not require medical attention (report to your doctor or health care professional if they continue or are bothersome): -acne -breast pain -change in sex drive or performance -changes in weight -cramping, dizziness, or faintness while the device is being inserted -headache -irregular menstrual bleeding within first 3 to 6 months of use -nausea This list may not describe all possible side effects. Call your doctor for medical advice about side effects. You may report side effects to FDA at 1-800-FDA-1088. Where should I keep my medicine? This does not apply. NOTE: This sheet is a summary. It may not cover all possible information. If you have questions about this medicine, talk to your doctor, pharmacist, or health care provider.  2018 Elsevier/Gold Standard (2016-05-16 14:14:56)

## 2017-05-05 NOTE — Progress Notes (Signed)
     GYNECOLOGY OFFICE PROGRESS NOTE  History:  21 y.o. G1P0010 here today for today for IUD string check; Mirena IUD was placed 03/13/2017. No complaints about the IUD, no concerning side effects.  The following portions of the patient's history were reviewed and updated as appropriate: allergies, current medications, past family history, past medical history, past social history, past surgical history and problem list. Last pap smear on 10/22/2016 was normal.  Review of Systems:   Pertinent items are noted in HPI.   Objective:  Physical Exam  Blood pressure 128/78, pulse 76, weight 235 lb 6.4 oz (106.8 kg), last menstrual period 04/08/2017, unknown if currently breastfeeding.   CONSTITUTIONAL: Well-developed, well-nourished female in no acute distress.   ABDOMEN: Soft, no distention noted.    PELVIC: Normal appearing external genitalia; normal appearing vaginal mucosa and cervix.  IUD strings not visualized. Bedside US preformed by Neita Carp and IUD correctly placed.   Assessment & Plan:   Normal IUD check.  Patient to keep IUD in place for five years; can come in for removal if she desires pregnancy within the next five years.  Advised IUD will probably have to be removed under US guidance.   Routine preventative health maintenance measures emphasized.  RTC x 6 months for annual exam or sooner if needed.   Gunnar Bulla, CNM

## 2017-05-18 ENCOUNTER — Encounter: Payer: Self-pay | Admitting: Certified Nurse Midwife

## 2017-06-18 ENCOUNTER — Ambulatory Visit (INDEPENDENT_AMBULATORY_CARE_PROVIDER_SITE_OTHER): Payer: BC Managed Care – PPO | Admitting: Certified Nurse Midwife

## 2017-06-18 ENCOUNTER — Other Ambulatory Visit: Payer: Self-pay | Admitting: Certified Nurse Midwife

## 2017-06-18 ENCOUNTER — Encounter: Payer: Self-pay | Admitting: Certified Nurse Midwife

## 2017-06-18 ENCOUNTER — Ambulatory Visit (INDEPENDENT_AMBULATORY_CARE_PROVIDER_SITE_OTHER): Payer: BC Managed Care – PPO

## 2017-06-18 VITALS — BP 121/86 | HR 85 | Wt 237.1 lb

## 2017-06-18 DIAGNOSIS — Z975 Presence of (intrauterine) contraceptive device: Secondary | ICD-10-CM | POA: Diagnosis not present

## 2017-06-18 DIAGNOSIS — Z538 Procedure and treatment not carried out for other reasons: Secondary | ICD-10-CM

## 2017-06-18 DIAGNOSIS — N921 Excessive and frequent menstruation with irregular cycle: Secondary | ICD-10-CM | POA: Diagnosis not present

## 2017-06-18 DIAGNOSIS — Z30432 Encounter for removal of intrauterine contraceptive device: Secondary | ICD-10-CM

## 2017-06-21 NOTE — Progress Notes (Signed)
Adriana Lewis is a 21 y.o. year old [id number] Caucasian female who presents for removal of a Mirena IUD. Her Mirena IUD was placed 03/13/2017.   No LMP recorded. Patient is not currently having periods (Reason: IUD).   BP 121/86   Pulse 85   Wt 237 lb 1.6 oz (107.5 kg)   BMI 36.05 kg/m   Time out was performed.  A medium plastic speculum was placed in the vagina.  The cervix was visualized, and the strings were not visible.  ULTRASOUND REPORT  Location: [facility] Date of Service:  06/18/17   Indications: IUD removal Findings:   Official GYN scan was not performed.    Transvaginal scan was performed with Marcelino DusterMichelle in room to check for IUD placement.  IUD appears to be in the correct location within the endometrium with IUD strings visible at cervix.  (No pictures taken)  Transabdominal was attempted during IUD removal with Marcelino DusterMichelle and Melody in the room, but no anatomy was visualized due to overlying bowel gas.  Impression: 1. Unsuccessful attempt at IUD removal with no transabdominal visualization of pelvic anatomy.  Recommendations: 1.Clinical correlation with the patient's History and Physical Exam.  Reviewed red flag symptoms and when to call.   RTC x 1 week for follow up with Dr. Valentino Saxonherry to discussed attempt removal under ultrasound in office or in OR.    Gunnar BullaJenkins Michelle Jonathin Heinicke, CNM

## 2017-06-24 ENCOUNTER — Encounter: Payer: Self-pay | Admitting: Obstetrics and Gynecology

## 2017-06-24 ENCOUNTER — Ambulatory Visit: Payer: BC Managed Care – PPO

## 2017-06-24 ENCOUNTER — Ambulatory Visit (INDEPENDENT_AMBULATORY_CARE_PROVIDER_SITE_OTHER): Payer: BC Managed Care – PPO | Admitting: Obstetrics and Gynecology

## 2017-06-24 VITALS — BP 107/70 | HR 81 | Ht 68.0 in | Wt 237.9 lb

## 2017-06-24 DIAGNOSIS — Z30432 Encounter for removal of intrauterine contraceptive device: Secondary | ICD-10-CM | POA: Diagnosis not present

## 2017-06-24 NOTE — Progress Notes (Signed)
GYNECOLOGY PROGRESS NOTE  Subjective:    Patient ID: Adriana Lewis, female    DOB: Jul 12, 1996, 21 y.o.   MRN: [id number]  HPI  Patient is a 21 y.o. G62P0010 female who presents for discussion of surgical management for IUD removal for lost IUD threads.  Was referred from midwife service Serafina Royals, CNM). Notes attempts last visit to remove IUD were unsuccessful, even after ultrasound guidance and a second provider attempt.  Patient desires IUD removal as she is planning on conception, as well as she notes that she has been having "problems" with the IUD since it was placed.    The following portions of the patient's history were reviewed and updated as appropriate: allergies, current medications, past family history, past medical history, past social history, past surgical history and problem list.  Review of Systems Pertinent items noted in HPI and remainder of comprehensive ROS otherwise negative.   Objective:   Blood pressure 107/70, pulse 81, height 5\' 8"  (1.727 m), weight 237 lb 14.4 oz (107.9 kg), unknown if currently breastfeeding. General appearance: alert and no distress Pelvic: external genitalia normal, rectovaginal septum normal.  Vagina without discharge.  Cervix normal appearing, no lesions and no motion tenderness.  IUD threads not visible.  Uterus mobile, nontender, normal shape and size.   Assessment:   IUD threads lost  Plan:   - Discussed procedure of hysteroscopic retrieval with patient, including risks vs benefits. Patient notes understanding.  Offered patient one additional attempt in office prior to scheduling for surgery.  Patient willing to try.  IUD successfully removed in office (see below procedure).    GYNECOLOGY OFFICE PROCEDURE NOTE  Adriana Lewis is a 21 y.o. G1P0010 here for Mirena IUD removal. No GYN concerns.  Last pap smear was on 10/2016 and was normal.  IUD Removal  Patient identified, informed consent performed,  consent signed.  Patient was in the dorsal lithotomy position, normal external genitalia was noted.  A speculum was placed in the patient's vagina, normal discharge was noted, no lesions. The cervix was visualized, no lesions, no abnormal discharge.  The strings of the IUD were not visualized, so Kelly forceps were introduced into the endometrial cavity and the IUD was grasped and removed in its entirety.  Patient tolerated the procedure well.    Patient plans for pregnancy soon and she was told to avoid teratogens, take PNV and folic acid.  Routine preventative health maintenance measures emphasized.     Hildred Laser, MD [facility]

## 2017-06-25 ENCOUNTER — Encounter: Payer: Self-pay | Admitting: Certified Nurse Midwife

## 2017-06-26 ENCOUNTER — Other Ambulatory Visit: Payer: Self-pay | Admitting: Certified Nurse Midwife

## 2017-06-26 DIAGNOSIS — N926 Irregular menstruation, unspecified: Secondary | ICD-10-CM

## 2017-06-26 DIAGNOSIS — Z8759 Personal history of other complications of pregnancy, childbirth and the puerperium: Secondary | ICD-10-CM

## 2017-07-23 ENCOUNTER — Encounter: Payer: Self-pay | Admitting: Obstetrics and Gynecology

## 2017-08-18 NOTE — L&D Delivery Note (Signed)
Delivery Note At 10:21 AM a viable and healthy female was delivered via Vaginal, Spontaneous (Presentation: LOA ).  APGAR: 9, 9; weight 7 lb 10.4 oz (3470 g).   Placenta status: spontaneous, intact.  Cord:  with the following complications: [place] x 2 reduced.  Cord pH: na  Anesthesia:  Epidural and local Episiotomy: None Lacerations: 2nd degree;Vaginal Suture Repair: 2.0 vicryl rapide Est. Blood Loss (mL): 355  Mom to postpartum.  Baby to Couplet care / Skin to Skin.  Adriana Lewis J 06/21/2018, 2:44 PM

## 2017-09-25 ENCOUNTER — Other Ambulatory Visit (HOSPITAL_COMMUNITY): Payer: Self-pay | Admitting: Obstetrics and Gynecology

## 2017-09-25 DIAGNOSIS — N915 Oligomenorrhea, unspecified: Secondary | ICD-10-CM

## 2017-09-27 ENCOUNTER — Ambulatory Visit (HOSPITAL_COMMUNITY)
Admission: RE | Admit: 2017-09-27 | Discharge: 2017-09-27 | Disposition: A | Discharge status: Home / Self Care | Payer: BC Managed Care – PPO | Source: Ambulatory Visit | Attending: Obstetrics and Gynecology | Admitting: Obstetrics and Gynecology

## 2017-09-27 DIAGNOSIS — N915 Oligomenorrhea, unspecified: Secondary | ICD-10-CM | POA: Diagnosis present

## 2017-11-23 LAB — OB RESULTS CONSOLE GC/CHLAMYDIA
Chlamydia: NEGATIVE
Gonorrhea: NEGATIVE

## 2017-11-23 LAB — OB RESULTS CONSOLE ABO/RH: Rh Factor: POSITIVE

## 2017-11-23 LAB — OB RESULTS CONSOLE ANTIBODY SCREEN: Antibody Screen: NEGATIVE

## 2017-11-23 LAB — OB RESULTS CONSOLE HEPATITIS B SURFACE ANTIGEN: Hepatitis B Surface Ag: NEGATIVE

## 2017-11-23 LAB — OB RESULTS CONSOLE RPR: RPR: NONREACTIVE

## 2017-11-23 LAB — OB RESULTS CONSOLE HIV ANTIBODY (ROUTINE TESTING): HIV: NONREACTIVE

## 2017-11-23 LAB — OB RESULTS CONSOLE RUBELLA ANTIBODY, IGM: Rubella: NON-IMMUNE/NOT IMMUNE

## 2018-02-28 ENCOUNTER — Inpatient Hospital Stay (HOSPITAL_COMMUNITY)
Admission: AD | Admit: 2018-02-28 | Discharge: 2018-03-01 | Disposition: A | Discharge status: Home / Self Care | Payer: BC Managed Care – PPO | Source: Ambulatory Visit | Attending: Obstetrics and Gynecology | Admitting: Obstetrics and Gynecology

## 2018-02-28 ENCOUNTER — Encounter (HOSPITAL_COMMUNITY): Payer: Self-pay | Admitting: *Deleted

## 2018-02-28 DIAGNOSIS — M549 Dorsalgia, unspecified: Secondary | ICD-10-CM

## 2018-02-28 DIAGNOSIS — Z3686 Encounter for antenatal screening for cervical length: Secondary | ICD-10-CM | POA: Insufficient documentation

## 2018-02-28 DIAGNOSIS — Z882 Allergy status to sulfonamides status: Secondary | ICD-10-CM | POA: Insufficient documentation

## 2018-02-28 DIAGNOSIS — M7918 Myalgia, other site: Secondary | ICD-10-CM | POA: Diagnosis not present

## 2018-02-28 DIAGNOSIS — O9989 Other specified diseases and conditions complicating pregnancy, childbirth and the puerperium: Secondary | ICD-10-CM

## 2018-02-28 DIAGNOSIS — O26892 Other specified pregnancy related conditions, second trimester: Secondary | ICD-10-CM | POA: Diagnosis not present

## 2018-02-28 DIAGNOSIS — M545 Low back pain: Secondary | ICD-10-CM | POA: Diagnosis present

## 2018-02-28 DIAGNOSIS — Z3A22 22 weeks gestation of pregnancy: Secondary | ICD-10-CM | POA: Diagnosis not present

## 2018-02-28 DIAGNOSIS — O4442 Low lying placenta NOS or without hemorrhage, second trimester: Secondary | ICD-10-CM | POA: Diagnosis not present

## 2018-02-28 DIAGNOSIS — O99891 Other specified diseases and conditions complicating pregnancy: Secondary | ICD-10-CM

## 2018-02-28 LAB — URINALYSIS, ROUTINE W REFLEX MICROSCOPIC
Bilirubin Urine: NEGATIVE
Glucose, UA: NEGATIVE mg/dL
Hgb urine dipstick: NEGATIVE
Ketones, ur: 20 mg/dL — AB
Leukocytes, UA: NEGATIVE
Nitrite: NEGATIVE
Protein, ur: NEGATIVE mg/dL
Specific Gravity, Urine: 1.012 (ref 1.005–1.030)
pH: 6 (ref 5.0–8.0)

## 2018-02-28 NOTE — MAU Provider Note (Signed)
Chief Complaint: Back Pain   First Provider Initiated Contact with Patient 02/28/18 2336      SUBJECTIVE HPI: Adriana Lewis is a 22 y.o. G2P0010 at [redacted]w[redacted]d by LMP who presents to maternity admissions reporting acute onset of low back pain while sitting in church this morning. She reports the pain is constant, but increases in waves. Changing positions causes an increase in pain. She tried Tylenol, which reduced the pain a little, and walking, which made the pain worse. She got into the pool but was not able to get past the stairs due to pain.  She also tried heating pad which seemed to make the pain worse.  There are no other associated symptoms. She denies abdominal pain, vaginal bleeding, n/v, or fever/chills. She denies dysuria or constipation.  She had cervical length of 2.7 on previous US but it was followed weekly and was then 3.0 and greater on subsequent imaging.  She also reports low-lying placenta that is improving and restrictions like pelvic rest were removed.  HPI  Past Medical History:  Diagnosis Date  . Irregular heartbeat    pt reports history of palpitations   Past Surgical History:  Procedure Laterality Date  . DILATION AND EVACUATION N/A 02/02/2017   Procedure: DILATATION AND EVACUATION;  Surgeon: Linzie Collin, MD;  Location: [facility] ORS;  Service: Gynecology;  Laterality: N/A;  . UPPER GI ENDOSCOPY    . WISDOM TOOTH EXTRACTION     Social History   Socioeconomic History  . Marital status: Married    Spouse name: Not on file  . Number of children: Not on file  . Years of education: Not on file  . Highest education level: Not on file  Occupational History  . Not on file  Social Needs  . Financial resource strain: Not on file  . Food insecurity:    Worry: Not on file    Inability: Not on file  . Transportation needs:    Medical: Not on file    Non-medical: Not on file  Tobacco Use  . Smoking status: Never Smoker  . Smokeless tobacco: Never Used   Substance and Sexual Activity  . Alcohol use: No    Comment: rarely 2-3 glasses of wine per year  . Drug use: No  . Sexual activity: Yes    Birth control/protection: None, IUD    Comment: mirena  Lifestyle  . Physical activity:    Days per week: Not on file    Minutes per session: Not on file  . Stress: Not on file  Relationships  . Social connections:    Talks on phone: Not on file    Gets together: Not on file    Attends religious service: Not on file    Active member of club or organization: Not on file    Attends meetings of clubs or organizations: Not on file    Relationship status: Not on file  . Intimate partner violence:    Fear of current or ex partner: Not on file    Emotionally abused: Not on file    Physically abused: Not on file    Forced sexual activity: Not on file  Other Topics Concern  . Not on file  Social History Narrative  . Not on file   No current facility-administered medications on file prior to encounter.    Current Outpatient Medications on File Prior to Encounter  Medication Sig Dispense Refill  . Prenatal Vit-Fe Fumarate-FA (MULTIVITAMIN-PRENATAL) 27-0.8 MG TABS tablet Take 1  tablet by mouth daily at 12 noon.     Allergies  Allergen Reactions  . Latex Hives  . Sulfa Antibiotics Hives    ROS:  Review of Systems  Constitutional: Negative for chills, fatigue and fever.  Eyes: Negative for visual disturbance.  Respiratory: Negative for shortness of breath.   Cardiovascular: Negative for chest pain.  Gastrointestinal: Negative for abdominal pain, nausea and vomiting.  Genitourinary: Negative for difficulty urinating, dysuria, flank pain, pelvic pain, vaginal bleeding, vaginal discharge and vaginal pain.  Musculoskeletal: Positive for back pain.  Neurological: Negative for dizziness and headaches.  Psychiatric/Behavioral: Negative.      I have reviewed patient's Past Medical Hx, Surgical Hx, Family Hx, Social Hx, medications and  allergies.   Physical Exam   Patient Vitals for the past 24 hrs:  BP Temp Temp src Pulse Resp Height Weight  03/01/18 0145 116/66 - - - - - -  02/28/18 2212 - - - (!) 115 - - -  02/28/18 2129 116/76 98.3 F (36.8 C) Oral (!) 141 18 5\' 7"  (1.702 m) 226 lb (102.5 kg)   Constitutional: Well-developed, well-nourished female in moderate distress.  Cardiovascular: normal rate Respiratory: normal effort GI: Abd soft, non-tender. Pos BS x 4 MS: Extremities nontender, no edema, normal ROM Neurologic: Alert and oriented x 4.  GU: Neg CVAT.  Cervix closed, 50%, soft, posterior  FHT 155 by doppler  LAB RESULTS Results for orders placed or performed during the hospital encounter of 02/28/18 (from the past 24 hour(s))  Urinalysis, Routine w reflex microscopic     Status: Abnormal   Collection Time: 02/28/18  9:48 PM  Result Value Ref Range   Color, Urine YELLOW YELLOW   APPearance CLEAR CLEAR   Specific Gravity, Urine 1.012 1.005 - 1.030   pH 6.0 5.0 - 8.0   Glucose, UA NEGATIVE NEGATIVE mg/dL   Hgb urine dipstick NEGATIVE NEGATIVE   Bilirubin Urine NEGATIVE NEGATIVE   Ketones, ur 20 (A) NEGATIVE mg/dL   Protein, ur NEGATIVE NEGATIVE mg/dL   Nitrite NEGATIVE NEGATIVE   Leukocytes, UA NEGATIVE NEGATIVE       IMAGING Preliminary report MFM Limited OB US Placenta wnl FHR wnl Cervix 2.8 cm in length  MAU Management/MDM: FHT wnl, pain most c/w orthopedic cause, musculoskeletal back pain Cervical exam with subjectively short cervix so cervical length done via US, with Limited OB US to evaluate placenta  US with normal findings, no evidence of preterm labor Consult Dr Billy Coastaavon with assessment and findings.   Treatments in MAU included ibuprofen 600 mg x 1 dose.   Rx for ibuprofen 600 mg Q 6 hours x 48 hours.  May use rest/ice/heat/warm bath/Tylenol PRN for pain as well.  Pt discharged with strict return precautions.  ASSESSMENT 1. Musculoskeletal pain   2. Back pain affecting  pregnancy in second trimester     PLAN Discharge home  Allergies as of 03/01/2018      Reactions   Latex Hives   Sulfa Antibiotics Hives      Medication List    TAKE these medications   ibuprofen 600 MG tablet Commonly known as:  ADVIL,MOTRIN Take 1 tablet (600 mg total) by mouth every 6 (six) hours as needed for up to 2 days.   multivitamin-prenatal 27-0.8 MG Tabs tablet Take 1 tablet by mouth daily at 12 noon.      Follow-up Information    Olivia Mackieaavon, Richard, MD Follow up.   Specialty:  Obstetrics and Gynecology Why:  With worsening symptoms.  Return to MAU for emergencies. Contact information: [address] [place] [address] [phone]           Sharen Counter Certified Nurse-Midwife 03/01/2018  5:05 AM

## 2018-02-28 NOTE — MAU Note (Signed)
Pt reports she stared having back pain this morning. progressively gotten worse. Took some tylenol without relief. Tried heating pad got worse. Sat down in recliner to eat dinner and then got nauseated an vomited dinner u . Has slight nausea now  Back pain is still severe hard to walk or to sit. Pain is constant with some "jolts of increase pain with movements.

## 2018-03-01 ENCOUNTER — Inpatient Hospital Stay (HOSPITAL_BASED_OUTPATIENT_CLINIC_OR_DEPARTMENT_OTHER): Payer: BC Managed Care – PPO

## 2018-03-01 DIAGNOSIS — Z3686 Encounter for antenatal screening for cervical length: Secondary | ICD-10-CM

## 2018-03-01 DIAGNOSIS — O4442 Low lying placenta NOS or without hemorrhage, second trimester: Secondary | ICD-10-CM | POA: Diagnosis not present

## 2018-03-01 DIAGNOSIS — Z3A22 22 weeks gestation of pregnancy: Secondary | ICD-10-CM

## 2018-03-01 MED ORDER — IBUPROFEN 600 MG PO TABS: 600.0000 mg | ORAL_TABLET | Freq: Four times a day (QID) | ORAL | 0 refills | Status: AC | PRN

## 2018-03-01 MED ORDER — IBUPROFEN 600 MG PO TABS
600.0000 mg | ORAL_TABLET | Freq: Once | ORAL | Status: AC
  Administered 2018-03-01: 600 mg via ORAL
  Filled 2018-03-01: qty 1

## 2018-05-11 ENCOUNTER — Other Ambulatory Visit: Payer: Self-pay

## 2018-05-11 ENCOUNTER — Inpatient Hospital Stay (HOSPITAL_COMMUNITY)
Admission: AD | Admit: 2018-05-11 | Discharge: 2018-05-11 | Disposition: A | Discharge status: Home / Self Care | Payer: BC Managed Care – PPO | Source: Ambulatory Visit | Attending: Obstetrics and Gynecology | Admitting: Obstetrics and Gynecology

## 2018-05-11 ENCOUNTER — Encounter (HOSPITAL_COMMUNITY): Payer: Self-pay

## 2018-05-11 DIAGNOSIS — O4703 False labor before 37 completed weeks of gestation, third trimester: Secondary | ICD-10-CM | POA: Diagnosis not present

## 2018-05-11 DIAGNOSIS — Z0371 Encounter for suspected problem with amniotic cavity and membrane ruled out: Secondary | ICD-10-CM

## 2018-05-11 DIAGNOSIS — Z3A32 32 weeks gestation of pregnancy: Secondary | ICD-10-CM | POA: Diagnosis not present

## 2018-05-11 LAB — WET PREP, GENITAL
Clue Cells Wet Prep HPF POC: NONE SEEN
Sperm: NONE SEEN
Trich, Wet Prep: NONE SEEN
Yeast Wet Prep HPF POC: NONE SEEN

## 2018-05-11 LAB — URINALYSIS, ROUTINE W REFLEX MICROSCOPIC
Bilirubin Urine: NEGATIVE
Glucose, UA: NEGATIVE mg/dL
Hgb urine dipstick: NEGATIVE
Ketones, ur: NEGATIVE mg/dL
Leukocytes, UA: NEGATIVE
Nitrite: NEGATIVE
Protein, ur: NEGATIVE mg/dL
Specific Gravity, Urine: 1.004 — ABNORMAL LOW (ref 1.005–1.030)
pH: 7 (ref 5.0–8.0)

## 2018-05-11 LAB — FETAL FIBRONECTIN: Fetal Fibronectin: NEGATIVE

## 2018-05-11 LAB — POCT FERN TEST: POCT Fern Test: NEGATIVE

## 2018-05-11 NOTE — Discharge Instructions (Signed)
Braxton Hicks Contractions °Contractions of the uterus can occur throughout pregnancy, but they are not always a sign that you are in labor. You may have practice contractions called Braxton Hicks contractions. These false labor contractions are sometimes confused with true labor. °What are Braxton Hicks contractions? °Braxton Hicks contractions are tightening movements that occur in the muscles of the uterus before labor. Unlike true labor contractions, these contractions do not result in opening (dilation) and thinning of the cervix. Toward the end of pregnancy (32-34 weeks), Braxton Hicks contractions can happen more often and may become stronger. These contractions are sometimes difficult to tell apart from true labor because they can be very uncomfortable. You should not feel embarrassed if you go to the hospital with false labor. °Sometimes, the only way to tell if you are in true labor is for your health care provider to look for changes in the cervix. The health care provider will do a physical exam and may monitor your contractions. If you are not in true labor, the exam should show that your cervix is not dilating and your water has not broken. °If there are other health problems associated with your pregnancy, it is completely safe for you to be sent home with false labor. You may continue to have Braxton Hicks contractions until you go into true labor. °How to tell the difference between true labor and false labor °True labor °· Contractions last 30-70 seconds. °· Contractions become very regular. °· Discomfort is usually felt in the top of the uterus, and it spreads to the lower abdomen and low back. °· Contractions do not go away with walking. °· Contractions usually become more intense and increase in frequency. °· The cervix dilates and gets thinner. °False labor °· Contractions are usually shorter and not as strong as true labor contractions. °· Contractions are usually irregular. °· Contractions  are often felt in the front of the lower abdomen and in the groin. °· Contractions may go away when you walk around or change positions while lying down. °· Contractions get weaker and are shorter-lasting as time goes on. °· The cervix usually does not dilate or become thin. °Follow these instructions at home: °· Take over-the-counter and prescription medicines only as told by your health care provider. °· Keep up with your usual exercises and follow other instructions from your health care provider. °· Eat and drink lightly if you think you are going into labor. °· If Braxton Hicks contractions are making you uncomfortable: °? Change your position from lying down or resting to walking, or change from walking to resting. °? Sit and rest in a tub of warm water. °? Drink enough fluid to keep your urine pale yellow. Dehydration may cause these contractions. °? Do slow and deep breathing several times an hour. °· Keep all follow-up prenatal visits as told by your health care provider. This is important. °Contact a health care provider if: °· You have a fever. °· You have continuous pain in your abdomen. °Get help right away if: °· Your contractions become stronger, more regular, and closer together. °· You have fluid leaking or gushing from your vagina. °· You pass blood-tinged mucus (bloody show). °· You have bleeding from your vagina. °· You have low back pain that you never had before. °· You feel your baby’s head pushing down and causing pelvic pressure. °· Your baby is not moving inside you as much as it used to. °Summary °· Contractions that occur before labor are called Braxton   Hicks contractions, false labor, or practice contractions. °· Braxton Hicks contractions are usually shorter, weaker, farther apart, and less regular than true labor contractions. True labor contractions usually become progressively stronger and regular and they become more frequent. °· Manage discomfort from Braxton Hicks contractions by  changing position, resting in a warm bath, drinking plenty of water, or practicing deep breathing. °This information is not intended to replace advice given to you by your health care provider. Make sure you discuss any questions you have with your health care provider. °Document Released: 12/18/2016 Document Revised: 12/18/2016 Document Reviewed: 12/18/2016 °Elsevier Interactive Patient Education © 2018 [employer] ° °Fetal Movement Counts °Patient Name: ________________________________________________ Patient Due Date: ____________________ °What is a fetal movement count? °A fetal movement count is the number of times that you feel your baby move during a certain amount of time. This may also be called a fetal kick count. A fetal movement count is recommended for every pregnant woman. You may be asked to start counting fetal movements as early as week 28 of your pregnancy. °Pay attention to when your baby is most active. You may notice your baby's sleep and wake cycles. You may also notice things that make your baby move more. You should do a fetal movement count: °· When your baby is normally most active. °· At the same time each day. ° °A good time to count movements is while you are resting, after having something to eat and drink. °How do I count fetal movements? °1. Find a quiet, comfortable area. Sit, or lie down on your side. °2. Write down the date, the start time and stop time, and the number of movements that you felt between those two times. Take this information with you to your health care visits. °3. For 2 hours, count kicks, flutters, swishes, rolls, and jabs. You should feel at least 10 movements during 2 hours. °4. You may stop counting after you have felt 10 movements. °5. If you do not feel 10 movements in 2 hours, have something to eat and drink. Then, keep resting and counting for 1 hour. If you feel at least 4 movements during that hour, you may stop counting. °Contact a health care  provider if: °· You feel fewer than 4 movements in 2 hours. °· Your baby is not moving like he or she usually does. °Date: ____________ Start time: ____________ Stop time: ____________ Movements: ____________ °Date: ____________ Start time: ____________ Stop time: ____________ Movements: ____________ °Date: ____________ Start time: ____________ Stop time: ____________ Movements: ____________ °Date: ____________ Start time: ____________ Stop time: ____________ Movements: ____________ °Date: ____________ Start time: ____________ Stop time: ____________ Movements: ____________ °Date: ____________ Start time: ____________ Stop time: ____________ Movements: ____________ °Date: ____________ Start time: ____________ Stop time: ____________ Movements: ____________ °Date: ____________ Start time: ____________ Stop time: ____________ Movements: ____________ °Date: ____________ Start time: ____________ Stop time: ____________ Movements: ____________ °This information is not intended to replace advice given to you by your health care provider. Make sure you discuss any questions you have with your health care provider. °Document Released: 09/03/2006 Document Revised: 04/02/2016 Document Reviewed: 09/13/2015 °Elsevier Interactive Patient Education © 2018 Else[employer] Inc[employer] ° °

## 2018-05-11 NOTE — MAU Provider Note (Signed)
Chief Complaint:  Contractions and Rupture of Membranes   None    HPI: Adriana Lewis is a 22 y.o. G2P0010 at 914w4d who presents to maternity admissions reporting contractions & leaking. Has been closely monitored in office d/t leaking & shortened cervix. Has been getting FFNs every 2 weeks that have been negative; next to be done tomorrow.  Woke up to painful contractions every 3-5 minutes this morning. Since arriving to MAU they have spaced out, last felt while in the bathroom.  Has had increase in leaking since yesterday. Thinks fluid is clear. Has to change her underwear d/t leaking leaving her underwear wet. Has gone through 3 pairs of underwear today.  No recent intercourse. No vaginal bleeding. Positive fetal movement.   Location: abdomen Quality: contraction Severity: 5/10 in pain scale Duration: 4 hours Timing: every 3-5 minutes Modifying factors: none Associated signs and symptoms: LOF vs vaginal discharge   Past Medical History:  Diagnosis Date  . Irregular heartbeat    pt reports history of palpitations   OB History  Gravida Para Term Preterm AB Living  2 0 0 0 1 0  SAB TAB Ectopic Multiple Live Births  1 0 0 0 0    # Outcome Date GA Lbr Len/2nd Weight Sex Delivery Anes PTL Lv  2 Current           1 SAB      SAB      Past Surgical History:  Procedure Laterality Date  . DILATION AND EVACUATION N/A 02/02/2017   Procedure: DILATATION AND EVACUATION;  Surgeon: Linzie CollinEvans, David James, MD;  Location: [facility] ORS;  Service: Gynecology;  Laterality: N/A;  . UPPER GI ENDOSCOPY    . WISDOM TOOTH EXTRACTION     Family History  Problem Relation Age of Onset  . Diabetes Mother   . Hypertension Mother   . Hepatitis B Father   . Cirrhosis Father   . Diabetes Sister   . Heart disease Maternal Grandmother   . Cancer Paternal Grandmother        stomach  . Diabetes Paternal Grandfather    Social History   Tobacco Use  . Smoking status: Never Smoker  . Smokeless  tobacco: Never Used  Substance Use Topics  . Alcohol use: No    Comment: rarely 2-3 glasses of wine per year  . Drug use: No   Allergies  Allergen Reactions  . Latex Hives  . Sulfa Antibiotics Hives   Medications Prior to Admission  Medication Sig Dispense Refill Last Dose  . butalbital-acetaminophen-caffeine (FIORICET, ESGIC) 50-325-40 MG tablet TAKE 1-2 TABLETS BY MOUTH EVERY 4 HOURS AS NEEDED. MAX 6/DAY  0   . Prenatal Vit-Fe Fumarate-FA (MULTIVITAMIN-PRENATAL) 27-0.8 MG TABS tablet Take 1 tablet by mouth daily at 12 noon.   02/28/2018 at Unknown time    I have reviewed patient's Past Medical Hx, Surgical Hx, Family Hx, Social Hx, medications and allergies.   ROS:  Review of Systems  Constitutional: Negative.   Gastrointestinal: Positive for abdominal pain.  Genitourinary: Positive for vaginal discharge. Negative for dysuria and vaginal bleeding.    Physical Exam   Patient Vitals for the past 24 hrs:  BP Temp Temp src Pulse Resp SpO2 Height Weight  05/11/18 0908 115/61 98.4 F (36.9 C) Oral 93 16 100 % 5\' 8"  (1.727 m) 103 kg    Constitutional: Well-developed, well-nourished female in no acute distress.  Cardiovascular: normal rate & rhythm, no murmur Respiratory: normal effort, lung sounds clear throughout GI:  Abd soft, non-tender, gravid appropriate for gestational age. Pos BS x 4 MS: Extremities nontender, no edema, normal ROM Neurologic: Alert and oriented x 4.  GU:      Pelvic: NEFG, clumpy white discharge adherent to vaginal walls & cervix. No   pooling of fluid   Dilation: 1 Effacement (%): 60 Cervical Position: Posterior Station: -3 Presentation: Vertex Exam by:: Estanislado Spire NP  NST:  Baseline: 150 bpm, Variability: Good {> 6 bpm), Accelerations: Reactive and Decelerations: Absent   Labs: Results for orders placed or performed during the hospital encounter of 05/11/18 (from the past 24 hour(s))  Urinalysis, Routine w reflex microscopic     Status: Abnormal    Collection Time: 05/11/18  9:14 AM  Result Value Ref Range   Color, Urine STRAW (A) YELLOW   APPearance CLEAR CLEAR   Specific Gravity, Urine 1.004 (L) 1.005 - 1.030   pH 7.0 5.0 - 8.0   Glucose, UA NEGATIVE NEGATIVE mg/dL   Hgb urine dipstick NEGATIVE NEGATIVE   Bilirubin Urine NEGATIVE NEGATIVE   Ketones, ur NEGATIVE NEGATIVE mg/dL   Protein, ur NEGATIVE NEGATIVE mg/dL   Nitrite NEGATIVE NEGATIVE   Leukocytes, UA NEGATIVE NEGATIVE  Wet prep, genital     Status: Abnormal   Collection Time: 05/11/18  9:33 AM  Result Value Ref Range   Yeast Wet Prep HPF POC NONE SEEN NONE SEEN   Trich, Wet Prep NONE SEEN NONE SEEN   Clue Cells Wet Prep HPF POC NONE SEEN NONE SEEN   WBC, Wet Prep HPF POC FEW (A) NONE SEEN   Sperm NONE SEEN   Fetal fibronectin     Status: None   Collection Time: 05/11/18  9:33 AM  Result Value Ref Range   Fetal Fibronectin NEGATIVE NEGATIVE  POCT fern test     Status: None   Collection Time: 05/11/18  9:33 AM  Result Value Ref Range   POCT Fern Test Negative = intact amniotic membranes     Imaging:  No results found.  MAU Course: Orders Placed This Encounter  Procedures  . Wet prep, genital  . Urinalysis, Routine w reflex microscopic  . Fetal fibronectin  . POCT fern test  . Discharge patient   No orders of the defined types were placed in this encounter.   MDM: FFN - negative Wet prep - negative Fern negative & no pooling  C/w Dr. Billy Coast. Ok to discharge home. Pt to keep f/u in office tomorrow.   Assessment: 1. Encounter for suspected PROM, with rupture of membranes not found   2. [redacted] weeks gestation of pregnancy   3. Preterm uterine contractions in third trimester, antepartum     Plan: Discharge home in stable condition.  Preterm Labor precautions and fetal kick counts  Follow-up Information    Olivia Mackie, MD Follow up.   Specialty:  Obstetrics and Gynecology Contact information: [address] [place]  [address] [phone]           Allergies as of 05/11/2018      Reactions   Latex Hives   Sulfa Antibiotics Hives      Medication List    TAKE these medications   butalbital-acetaminophen-caffeine 50-325-40 MG tablet Commonly known as:  FIORICET, ESGIC TAKE 1-2 TABLETS BY MOUTH EVERY 4 HOURS AS NEEDED. MAX 6/DAY   multivitamin-prenatal 27-0.8 MG Tabs tablet Take 1 tablet by mouth daily at 12 noon.       Judeth Horn, NP 05/11/2018 10:43 AM

## 2018-05-11 NOTE — MAU Note (Addendum)
Pt has been having ctx on and off since 5am, which woke her up. Also having fluid leaking since then. Pt has a high resting pulse normally, around 100. Is seeing cardiology for this. Pain 5/10. No bleeding. + FM. Hx of short cervix, but was not dilated last week on exam.

## 2018-05-31 LAB — OB RESULTS CONSOLE GBS: GBS: NEGATIVE

## 2018-06-18 ENCOUNTER — Telehealth (HOSPITAL_COMMUNITY): Payer: Self-pay | Admitting: *Deleted

## 2018-06-18 ENCOUNTER — Other Ambulatory Visit: Payer: Self-pay | Admitting: Obstetrics and Gynecology

## 2018-06-18 ENCOUNTER — Encounter (HOSPITAL_COMMUNITY): Payer: Self-pay | Admitting: *Deleted

## 2018-06-18 NOTE — Telephone Encounter (Signed)
Preadmission screen  

## 2018-06-20 ENCOUNTER — Inpatient Hospital Stay (HOSPITAL_COMMUNITY)
Admission: AD | Admit: 2018-06-20 | Discharge: 2018-06-23 | DRG: 806 | Disposition: A | Discharge status: Home / Self Care | Payer: BC Managed Care – PPO | Attending: Obstetrics and Gynecology | Admitting: Obstetrics and Gynecology

## 2018-06-20 ENCOUNTER — Encounter (HOSPITAL_COMMUNITY): Payer: Self-pay | Admitting: *Deleted

## 2018-06-20 DIAGNOSIS — O9081 Anemia of the puerperium: Secondary | ICD-10-CM | POA: Diagnosis not present

## 2018-06-20 DIAGNOSIS — D62 Acute posthemorrhagic anemia: Secondary | ICD-10-CM | POA: Diagnosis not present

## 2018-06-20 DIAGNOSIS — O368130 Decreased fetal movements, third trimester, not applicable or unspecified: Secondary | ICD-10-CM

## 2018-06-20 DIAGNOSIS — O471 False labor at or after 37 completed weeks of gestation: Secondary | ICD-10-CM

## 2018-06-20 DIAGNOSIS — E669 Obesity, unspecified: Secondary | ICD-10-CM | POA: Diagnosis present

## 2018-06-20 DIAGNOSIS — O99214 Obesity complicating childbirth: Secondary | ICD-10-CM | POA: Diagnosis present

## 2018-06-20 DIAGNOSIS — Z3490 Encounter for supervision of normal pregnancy, unspecified, unspecified trimester: Secondary | ICD-10-CM

## 2018-06-20 DIAGNOSIS — O479 False labor, unspecified: Secondary | ICD-10-CM

## 2018-06-20 DIAGNOSIS — Z3483 Encounter for supervision of other normal pregnancy, third trimester: Secondary | ICD-10-CM | POA: Diagnosis not present

## 2018-06-20 DIAGNOSIS — Z3A38 38 weeks gestation of pregnancy: Secondary | ICD-10-CM

## 2018-06-20 LAB — CBC
HCT: 34.6 % — ABNORMAL LOW (ref 36.0–46.0)
Hemoglobin: 11.7 g/dL — ABNORMAL LOW (ref 12.0–15.0)
MCH: 29.3 pg (ref 26.0–34.0)
MCHC: 33.8 g/dL (ref 30.0–36.0)
MCV: 86.7 fL (ref 80.0–100.0)
Platelets: 237 10*3/uL (ref 150–400)
RBC: 3.99 MIL/uL (ref 3.87–5.11)
RDW: 13.2 % (ref 11.5–15.5)
WBC: 11 10*3/uL — ABNORMAL HIGH (ref 4.0–10.5)
nRBC: 0 % (ref 0.0–0.2)

## 2018-06-20 LAB — AMNISURE RUPTURE OF MEMBRANE (ROM) NOT AT ARMC: Amnisure ROM: NEGATIVE

## 2018-06-20 LAB — POCT FERN TEST: POCT Fern Test: NEGATIVE

## 2018-06-20 MED ORDER — BUTORPHANOL TARTRATE 1 MG/ML IJ SOLN
1.0000 mg | Freq: Once | INTRAMUSCULAR | Status: AC
  Administered 2018-06-20: 1 mg via INTRAMUSCULAR

## 2018-06-20 MED ORDER — BUTORPHANOL TARTRATE 1 MG/ML IJ SOLN
INTRAMUSCULAR | Status: AC
  Administered 2018-06-20: 1 mg via INTRAVENOUS
  Filled 2018-06-20: qty 2

## 2018-06-20 MED ORDER — BUTORPHANOL TARTRATE 1 MG/ML IJ SOLN
1.0000 mg | Freq: Once | INTRAMUSCULAR | Status: AC
  Administered 2018-06-20: 1 mg via INTRAVENOUS

## 2018-06-20 MED ORDER — PROMETHAZINE HCL 25 MG/ML IJ SOLN
12.5000 mg | Freq: Once | INTRAMUSCULAR | Status: AC
  Administered 2018-06-20: 12.5 mg via INTRAVENOUS
  Filled 2018-06-20: qty 1

## 2018-06-20 MED ORDER — LACTATED RINGERS IV SOLN: 500.0000 mL | INTRAVENOUS | Status: DC | PRN

## 2018-06-20 MED ORDER — LACTATED RINGERS IV SOLN
INTRAVENOUS | Status: DC
  Administered 2018-06-20 – 2018-06-21 (×2): via INTRAVENOUS

## 2018-06-20 NOTE — MAU Note (Signed)
Adriana Lewis is a [redacted]w[redacted]d G2P0010 at [redacted]w[redacted]d who presents to MAU today with complaint of contractions every 4-5 since 1500. She denies vaginal bleeding. She reported LOF, but was ruled out in MAU. Her GBS status is negative. She desires an epidural.   BP 128/72 (BP Location: Right Arm)   Pulse 94   Temp 97.9 F (36.6 C) (Oral)   Resp 18   Wt 106.9 kg   LMP 04/08/2017   BMI 35.85 kg/m  Dilation: 4 Effacement (%): 70, 80 Cervical Position: Posterior Exam by:: Carloyn Jaeger, CNM

## 2018-06-20 NOTE — MAU Note (Signed)
Ctx since 1500, have been getting closer and stronger, every 4-5 min  No bleeding  Reports she has been leaking for weeks and has been checked in the office and wasn't amniotic fluid but leaking has increased today with ctx, clear  Decreased movement- only felt 2-3 movements in the past few hours

## 2018-06-20 NOTE — MAU Provider Note (Signed)
History     CSN: [id number]  Arrival date and time: 06/20/18 1757   First Provider Initiated Contact with Patient 06/20/18 1850      Chief Complaint  Patient presents with  . Contractions  . Rupture of Membranes  . Vaginal Discharge  . Decreased Fetal Movement   HPI  Ms.  Adriana Lewis is a 22 y.o. year old G3P0010 female at [redacted]w[redacted]d weeks gestation who presents to MAU reporting abdominal pain/contractions since 1500 that are now every 4-5 mins; pain 8/10. She reports that she has been leaking fluid for weeks, but it is even more today. She reports clear fluid with "whilte clumpy d/c" on pad. She also complains of DFM x 2-3 movements in last few hours. She denies VB.  Past Medical History:  Diagnosis Date  . Irregular heartbeat    pt reports history of palpitations    Past Surgical History:  Procedure Laterality Date  . DILATION AND EVACUATION N/A 02/02/2017   Procedure: DILATATION AND EVACUATION;  Surgeon: Linzie Collin, MD;  Location: [facility] ORS;  Service: Gynecology;  Laterality: N/A;  . UPPER GI ENDOSCOPY    . WISDOM TOOTH EXTRACTION      Family History  Problem Relation Age of Onset  . Diabetes Mother   . Hypertension Mother   . Varicose Veins Mother   . Hepatitis B Father   . Cirrhosis Father   . Diabetes Sister   . Hypertension Sister   . Heart disease Maternal Grandmother   . Cancer Paternal Grandmother        stomach  . Diabetes Paternal Grandfather   . COPD Paternal Grandfather     Social History   Tobacco Use  . Smoking status: Never Smoker  . Smokeless tobacco: Never Used  Substance Use Topics  . Alcohol use: No    Comment: rarely 2-3 glasses of wine per year  . Drug use: No    Allergies:  Allergies  Allergen Reactions  . Latex Hives  . Sulfa Antibiotics Hives    Medications Prior to Admission  Medication Sig Dispense Refill Last Dose  . butalbital-acetaminophen-caffeine (FIORICET, ESGIC) 50-325-40 MG tablet TAKE 1-2 TABLETS  BY MOUTH EVERY 4 HOURS AS NEEDED. MAX 6/DAY  0   . Prenatal Vit-Fe Fumarate-FA (MULTIVITAMIN-PRENATAL) 27-0.8 MG TABS tablet Take 1 tablet by mouth daily at 12 noon.   02/28/2018 at Unknown time    Review of Systems  Constitutional: Negative.   HENT: Negative.   Eyes: Negative.   Respiratory: Negative.   Cardiovascular: Negative.   Gastrointestinal: Negative.   Endocrine: Negative.   Genitourinary: Positive for pelvic pain and vaginal discharge (inc. clear d/c).  Musculoskeletal: Positive for back pain.  Skin: Negative.   Allergic/Immunologic: Negative.   Neurological: Negative.   Hematological: Negative.   Psychiatric/Behavioral: Negative.    Physical Exam   Blood pressure 128/72, pulse 94, temperature 97.9 F (36.6 C), temperature source Oral, resp. rate 18, weight 106.9 kg, last menstrual period 04/08/2017.  Physical Exam  Nursing note and vitals reviewed. Constitutional: She is oriented to person, place, and time. She appears well-developed and well-nourished.  HENT:  Head: Normocephalic and atraumatic.  Eyes: Pupils are equal, round, and reactive to light.  Neck: Normal range of motion.  Cardiovascular: Normal rate.  Respiratory: Effort normal.  GI: Soft.  Genitourinary:  Genitourinary Comments: 1st VE: 3.0/60%/-2 2nd VE: 3.0/ Dilation: 4 Effacement (%): 70, 80 Cervical Position: Posterior Exam by: Carloyn Jaeger, CNM   Musculoskeletal: Normal range of motion.  Neurological: She is alert and oriented to person, place, and time. She has normal reflexes.  Skin: Skin is warm and dry.  Psychiatric: She has a normal mood and affect. Her behavior is normal. Judgment and thought content normal.    MAU Course  Procedures  MDM CCUA Fern Amnisure NST - FHR: 140 bpm / moderate variability / accels present / decels absent / TOCO: regular every 4-5 mins @ 2130 pain rated 10/10 -- pt tearful with UC's  *Consult with Dr. Billy Coast @ 2215 - notified of patient's complaints,  assessments, lab & NST results, tx plan admit for prodromal - agrees with plan, but prefers admission for overnight observation -- orders received to give Stadol 1 mg IV and Stadol 1 mg IM with Phenergan 12.5 mg IV, CEFM until UC's space out, emphasize to patient that she could be d/c'd home, if not 5-6 cm in the AM.  Results for orders placed or performed during the hospital encounter of 06/20/18 (from the past 24 hour(s))  Amnisure rupture of membrane (rom)not at [facility]     Status: None   Collection Time: 06/20/18  6:56 PM  Result Value Ref Range   Amnisure ROM NEGATIVE   Fern Test     Status: None   Collection Time: 06/20/18  6:58 PM  Result Value Ref Range   POCT Fern Test Negative = intact amniotic membranes     Assessment and Plan  Uterine contractions during pregnancy - Admit to L&D for overnight observation - Dr. Billy Coast assumes care of patient at time of admission - See Dr. Jorene Minors H&P   Adriana Mora, MSN,CNM 06/20/2018, 6:57 PM

## 2018-06-21 ENCOUNTER — Encounter (HOSPITAL_COMMUNITY): Payer: Self-pay | Admitting: Anesthesiology

## 2018-06-21 ENCOUNTER — Inpatient Hospital Stay (HOSPITAL_COMMUNITY): Payer: BC Managed Care – PPO | Admitting: Anesthesiology

## 2018-06-21 DIAGNOSIS — D62 Acute posthemorrhagic anemia: Secondary | ICD-10-CM | POA: Diagnosis not present

## 2018-06-21 DIAGNOSIS — O9081 Anemia of the puerperium: Secondary | ICD-10-CM | POA: Diagnosis not present

## 2018-06-21 DIAGNOSIS — Z3483 Encounter for supervision of other normal pregnancy, third trimester: Secondary | ICD-10-CM | POA: Diagnosis present

## 2018-06-21 DIAGNOSIS — Z3A38 38 weeks gestation of pregnancy: Secondary | ICD-10-CM | POA: Diagnosis not present

## 2018-06-21 DIAGNOSIS — E669 Obesity, unspecified: Secondary | ICD-10-CM | POA: Diagnosis present

## 2018-06-21 DIAGNOSIS — O99214 Obesity complicating childbirth: Secondary | ICD-10-CM | POA: Diagnosis present

## 2018-06-21 DIAGNOSIS — Z3490 Encounter for supervision of normal pregnancy, unspecified, unspecified trimester: Secondary | ICD-10-CM

## 2018-06-21 LAB — TYPE AND SCREEN
ABO/RH(D): A POS
Antibody Screen: NEGATIVE

## 2018-06-21 LAB — RPR: RPR Ser Ql: NONREACTIVE

## 2018-06-21 LAB — ABO/RH: ABO/RH(D): A POS

## 2018-06-21 MED ORDER — ZOLPIDEM TARTRATE 5 MG PO TABS: 5.0000 mg | ORAL_TABLET | Freq: Every evening | ORAL | Status: DC | PRN

## 2018-06-21 MED ORDER — LACTATED RINGERS IV SOLN: 500.0000 mL | INTRAVENOUS | Status: DC | PRN

## 2018-06-21 MED ORDER — SOD CITRATE-CITRIC ACID 500-334 MG/5ML PO SOLN: 30.0000 mL | ORAL | Status: DC | PRN

## 2018-06-21 MED ORDER — OXYTOCIN 40 UNITS IN LACTATED RINGERS INFUSION - SIMPLE MED
1.0000 m[IU]/min | INTRAVENOUS | Status: DC
  Filled 2018-06-21: qty 1000

## 2018-06-21 MED ORDER — COCONUT OIL OIL: 1.0000 "application " | TOPICAL_OIL | Status: DC | PRN

## 2018-06-21 MED ORDER — LIDOCAINE HCL (PF) 1 % IJ SOLN
INTRAMUSCULAR | Status: DC | PRN
  Administered 2018-06-21: 6 mL
  Administered 2018-06-21: 6 mL via EPIDURAL

## 2018-06-21 MED ORDER — SENNOSIDES-DOCUSATE SODIUM 8.6-50 MG PO TABS
2.0000 | ORAL_TABLET | ORAL | Status: DC
  Administered 2018-06-21 – 2018-06-22 (×2): 2 via ORAL
  Filled 2018-06-21 (×2): qty 2

## 2018-06-21 MED ORDER — TERBUTALINE SULFATE 1 MG/ML IJ SOLN
0.2500 mg | Freq: Once | INTRAMUSCULAR | Status: DC | PRN
  Filled 2018-06-21: qty 1

## 2018-06-21 MED ORDER — LIDOCAINE HCL (PF) 1 % IJ SOLN
30.0000 mL | INTRAMUSCULAR | Status: AC | PRN
  Administered 2018-06-21: 30 mL via SUBCUTANEOUS
  Filled 2018-06-21: qty 30

## 2018-06-21 MED ORDER — TETANUS-DIPHTH-ACELL PERTUSSIS 5-2.5-18.5 LF-MCG/0.5 IM SUSP: 0.5000 mL | Freq: Once | INTRAMUSCULAR | Status: DC

## 2018-06-21 MED ORDER — LACTATED RINGERS IV SOLN: 500.0000 mL | Freq: Once | INTRAVENOUS | Status: DC

## 2018-06-21 MED ORDER — DIPHENHYDRAMINE HCL 50 MG/ML IJ SOLN: 12.5000 mg | INTRAMUSCULAR | Status: DC | PRN

## 2018-06-21 MED ORDER — OXYTOCIN 40 UNITS IN LACTATED RINGERS INFUSION - SIMPLE MED
2.5000 [IU]/h | INTRAVENOUS | Status: DC
  Administered 2018-06-21: 2.5 [IU]/h via INTRAVENOUS

## 2018-06-21 MED ORDER — OXYCODONE-ACETAMINOPHEN 5-325 MG PO TABS: 2.0000 | ORAL_TABLET | ORAL | Status: DC | PRN

## 2018-06-21 MED ORDER — EPHEDRINE 5 MG/ML INJ
10.0000 mg | INTRAVENOUS | Status: DC | PRN
  Filled 2018-06-21: qty 2

## 2018-06-21 MED ORDER — PROMETHAZINE HCL 25 MG/ML IJ SOLN
12.5000 mg | Freq: Once | INTRAMUSCULAR | Status: AC
  Administered 2018-06-21: 12.5 mg via INTRAVENOUS
  Filled 2018-06-21: qty 1

## 2018-06-21 MED ORDER — ACETAMINOPHEN 325 MG PO TABS
650.0000 mg | ORAL_TABLET | ORAL | Status: DC | PRN
  Administered 2018-06-22: 650 mg via ORAL
  Filled 2018-06-21: qty 2

## 2018-06-21 MED ORDER — FENTANYL 2.5 MCG/ML BUPIVACAINE 1/10 % EPIDURAL INFUSION (WH - ANES)
14.0000 mL/h | INTRAMUSCULAR | Status: DC | PRN
  Administered 2018-06-21: 14 mL/h via EPIDURAL
  Filled 2018-06-21: qty 100

## 2018-06-21 MED ORDER — ONDANSETRON HCL 4 MG/2ML IJ SOLN: 4.0000 mg | INTRAMUSCULAR | Status: DC | PRN

## 2018-06-21 MED ORDER — OXYTOCIN BOLUS FROM INFUSION
500.0000 mL | Freq: Once | INTRAVENOUS | Status: AC
  Administered 2018-06-21: 500 mL via INTRAVENOUS

## 2018-06-21 MED ORDER — WITCH HAZEL-GLYCERIN EX PADS: 1.0000 "application " | MEDICATED_PAD | CUTANEOUS | Status: DC | PRN

## 2018-06-21 MED ORDER — METHYLERGONOVINE MALEATE 0.2 MG PO TABS: 0.2000 mg | ORAL_TABLET | ORAL | Status: DC | PRN

## 2018-06-21 MED ORDER — METHYLERGONOVINE MALEATE 0.2 MG/ML IJ SOLN
0.2000 mg | Freq: Once | INTRAMUSCULAR | Status: AC
  Administered 2018-06-21: 0.2 mg via INTRAMUSCULAR

## 2018-06-21 MED ORDER — DIBUCAINE 1 % RE OINT: 1.0000 "application " | TOPICAL_OINTMENT | RECTAL | Status: DC | PRN

## 2018-06-21 MED ORDER — METHYLERGONOVINE MALEATE 0.2 MG/ML IJ SOLN: 0.2000 mg | INTRAMUSCULAR | Status: DC | PRN

## 2018-06-21 MED ORDER — METHYLERGONOVINE MALEATE 0.2 MG/ML IJ SOLN
INTRAMUSCULAR | Status: AC
  Filled 2018-06-21: qty 1

## 2018-06-21 MED ORDER — OXYCODONE-ACETAMINOPHEN 5-325 MG PO TABS: 1.0000 | ORAL_TABLET | ORAL | Status: DC | PRN

## 2018-06-21 MED ORDER — BENZOCAINE-MENTHOL 20-0.5 % EX AERO
1.0000 "application " | INHALATION_SPRAY | CUTANEOUS | Status: DC | PRN
  Administered 2018-06-21: 1 via TOPICAL
  Filled 2018-06-21: qty 56

## 2018-06-21 MED ORDER — ONDANSETRON HCL 4 MG PO TABS
4.0000 mg | ORAL_TABLET | ORAL | Status: DC | PRN
  Administered 2018-06-23: 4 mg via ORAL
  Filled 2018-06-21: qty 1

## 2018-06-21 MED ORDER — DIPHENHYDRAMINE HCL 25 MG PO CAPS: 25.0000 mg | ORAL_CAPSULE | Freq: Four times a day (QID) | ORAL | Status: DC | PRN

## 2018-06-21 MED ORDER — SIMETHICONE 80 MG PO CHEW: 80.0000 mg | CHEWABLE_TABLET | ORAL | Status: DC | PRN

## 2018-06-21 MED ORDER — ACETAMINOPHEN 325 MG PO TABS: 650.0000 mg | ORAL_TABLET | ORAL | Status: DC | PRN

## 2018-06-21 MED ORDER — LACTATED RINGERS IV SOLN
500.0000 mL | Freq: Once | INTRAVENOUS | Status: AC
  Administered 2018-06-21: 500 mL via INTRAVENOUS

## 2018-06-21 MED ORDER — BUTORPHANOL TARTRATE 1 MG/ML IJ SOLN
1.0000 mg | Freq: Once | INTRAMUSCULAR | Status: AC
  Administered 2018-06-21: 1 mg via INTRAVENOUS
  Filled 2018-06-21: qty 1

## 2018-06-21 MED ORDER — ONDANSETRON HCL 4 MG/2ML IJ SOLN: 4.0000 mg | Freq: Four times a day (QID) | INTRAMUSCULAR | Status: DC | PRN

## 2018-06-21 MED ORDER — LACTATED RINGERS IV SOLN
INTRAVENOUS | Status: DC
  Administered 2018-06-21: 09:00:00 via INTRAVENOUS

## 2018-06-21 MED ORDER — PHENYLEPHRINE 40 MCG/ML (10ML) SYRINGE FOR IV PUSH (FOR BLOOD PRESSURE SUPPORT)
80.0000 ug | PREFILLED_SYRINGE | INTRAVENOUS | Status: DC | PRN
  Filled 2018-06-21: qty 10
  Filled 2018-06-21: qty 5

## 2018-06-21 MED ORDER — PRENATAL MULTIVITAMIN CH
1.0000 | ORAL_TABLET | Freq: Every day | ORAL | Status: DC
  Administered 2018-06-22 – 2018-06-23 (×2): 1 via ORAL
  Filled 2018-06-21 (×3): qty 1

## 2018-06-21 MED ORDER — IBUPROFEN 600 MG PO TABS
600.0000 mg | ORAL_TABLET | Freq: Four times a day (QID) | ORAL | Status: DC
  Administered 2018-06-21 – 2018-06-23 (×10): 600 mg via ORAL
  Filled 2018-06-21 (×10): qty 1

## 2018-06-21 MED ORDER — PHENYLEPHRINE 40 MCG/ML (10ML) SYRINGE FOR IV PUSH (FOR BLOOD PRESSURE SUPPORT)
80.0000 ug | PREFILLED_SYRINGE | INTRAVENOUS | Status: DC | PRN
  Filled 2018-06-21: qty 5

## 2018-06-21 NOTE — H&P (Signed)
Adriana Lewis is a 22 y.o. female presenting for labor evaluation. Prodromal labor then with noted cervical change after therapeutic rest. OB History    Gravida  2   Para  0   Term  0   Preterm  0   AB  1   Living  0     SAB  1   TAB  0   Ectopic  0   Multiple  0   Live Births  0          Past Medical History:  Diagnosis Date  . Irregular heartbeat    pt reports history of palpitations   Past Surgical History:  Procedure Laterality Date  . DILATION AND EVACUATION N/A 02/02/2017   Procedure: DILATATION AND EVACUATION;  Surgeon: Linzie Collin, MD;  Location: [facility] ORS;  Service: Gynecology;  Laterality: N/A;  . UPPER GI ENDOSCOPY    . WISDOM TOOTH EXTRACTION     Family History: family history includes COPD in her paternal grandfather; Cancer in her paternal grandmother; Cirrhosis in her father; Diabetes in her mother, paternal grandfather, and sister; Heart disease in her maternal grandmother; Hepatitis B in her father; Hypertension in her mother and sister; Varicose Veins in her mother. Social History:  reports that she has never smoked. She has never used smokeless tobacco. She reports that she does not drink alcohol or use drugs.     Maternal Diabetes: No Genetic Screening: Normal Maternal Ultrasounds/Referrals: Normal Fetal Ultrasounds or other Referrals:  None Maternal Substance Abuse:  No Significant Maternal Medications:  None Significant Maternal Lab Results:  None Other Comments:  None  Review of Systems  Constitutional: Negative.   All other systems reviewed and are negative.  Maternal Medical History:  Reason for admission: Contractions.   Contractions: Onset was 3-5 hours ago.   Frequency: regular.   Perceived severity is moderate.    Fetal activity: Perceived fetal activity is normal.   Last perceived fetal movement was within the past hour.    Prenatal complications: no prenatal complications Prenatal Complications -  Diabetes: none.    Dilation: 7 Effacement (%): 90 Station: -2 Exam by:: Belinda Fisher, RN Blood pressure 128/73, pulse (!) 102, temperature 98.6 F (37 C), temperature source Oral, resp. rate 16, weight 106.9 kg, last menstrual period 04/08/2017, unknown if currently breastfeeding. Maternal Exam:  Uterine Assessment: Contraction strength is moderate.  Contraction frequency is irregular.   Abdomen: Patient reports no abdominal tenderness. Fetal presentation: vertex  Introitus: Normal vulva. Normal vagina.  Ferning test: not done.  Nitrazine test: not done. Amniotic fluid character: not assessed.  Pelvis: questionable for delivery.   Cervix: Cervix evaluated by digital exam.     Physical Exam  Nursing note and vitals reviewed. Constitutional: She is oriented to person, place, and time. She appears well-developed and well-nourished.  HENT:  Head: Normocephalic and atraumatic.  Neck: Normal range of motion. Neck supple.  Cardiovascular: Normal rate and regular rhythm.  Respiratory: Effort normal and breath sounds normal.  GI: Soft. Bowel sounds are normal.  Genitourinary: Vagina normal and uterus normal.  Musculoskeletal: Normal range of motion.  Neurological: She is alert and oriented to person, place, and time. She has normal reflexes.  Skin: Skin is warm and dry.  Psychiatric: She has a normal mood and affect.    Prenatal labs: ABO, Rh: A/Positive/-- (04/08 0000) Antibody: n (04/08 0000) Rubella: Nonimmune (04/08 0000) RPR: Nonreactive (04/08 0000)  HBsAg: Negative (04/08 0000)  HIV: Non-reactive (04/08 0000)  GBS: Negative (10/14 0000)   Assessment/Plan: Term IUP Active labor  Admit   Jasman Pfeifle J 06/21/2018, 6:54 AM

## 2018-06-21 NOTE — Lactation Note (Signed)
This note was copied from a baby's chart. Lactation Consultation Note  Patient Name: Adriana Lewis [id number] Date: 06/21/2018 Reason for consult: Initial assessment;Primapara;1st time breastfeeding;Early term 37-38.6wks  P1 mother whose infant is now 25 hours old.  Mother stated that baby has breast fed one time since delivery.  Encouraged to feed 8-12 times/24 hours or sooner if baby shows feeding cues.  Mother was familiar with cues.  She was shown how to do hand expression and will continue this before/after feedings.  Colostrum container provided for any EBM she may obtain with hand expression.  Mother was able to express a couple of drops earlier today and fed drops back to baby.  Mom made aware of O/P services, breastfeeding support groups, community resources, and our phone # for post-discharge questions.   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  Discussed pumping and freezing milk and mother will pump at work if possible.  She will call for latch assistance as needed.  Father and visitors in room.   Maternal Data Formula Feeding for Exclusion: No Has patient been taught Hand Expression?: Yes Does the patient have breastfeeding experience prior to this delivery?: No  Feeding Feeding Type: Breast Fed  LATCH Score Latch: Repeated attempts needed to sustain latch, nipple held in mouth throughout feeding, stimulation needed to elicit sucking reflex.  Audible Swallowing: None  Type of Nipple: Flat  Comfort (Breast/Nipple): Soft / non-tender  Hold (Positioning): Assistance needed to correctly position infant at breast and maintain latch.  LATCH Score: 5  Interventions    Lactation Tools Discussed/Used WIC Program: No   Consult Status Consult Status: Follow-up Date: 06/22/18 Follow-up type: In-patient    Adriana Lewis R Janautica Netzley 06/21/2018, 6:07 PM

## 2018-06-21 NOTE — Anesthesia Postprocedure Evaluation (Signed)
Anesthesia Post Note  Patient: Adriana Lewis Pacer  Procedure(s) Performed: AN AD HOC LABOR EPIDURAL     Patient location during evaluation: Mother Baby Anesthesia Type: Epidural Level of consciousness: awake Pain management: satisfactory to patient Vital Signs Assessment: post-procedure vital signs reviewed and stable Respiratory status: spontaneous breathing Cardiovascular status: stable Anesthetic complications: no    Last Vitals:  Vitals:   06/21/18 1724 06/21/18 2034  BP: (!) 113/52 94/68  Pulse: 82 84  Resp: 18 18  Temp: 36.6 C 36.6 C  SpO2: 98%     Last Pain:  Vitals:   06/21/18 2034  TempSrc: Oral  PainSc:    Pain Goal:                 [employer]

## 2018-06-21 NOTE — Anesthesia Preprocedure Evaluation (Signed)
Anesthesia Evaluation  Patient identified by MRN, date of birth, ID band Patient awake    Reviewed: Allergy & Precautions, Patient's Chart, lab work & pertinent test results  Airway Mallampati: II  TM Distance: >3 FB Neck ROM: Full    Dental no notable dental hx. (+) Teeth Intact   Pulmonary neg pulmonary ROS,    Pulmonary exam normal breath sounds clear to auscultation       Cardiovascular negative cardio ROS Normal cardiovascular exam Rhythm:Regular Rate:Normal     Neuro/Psych negative neurological ROS  negative psych ROS   GI/Hepatic GERD  ,  Endo/Other  Obesity  Renal/GU negative Renal ROS  negative genitourinary   Musculoskeletal negative musculoskeletal ROS (+)   Abdominal (+) + obese,   Peds  Hematology  (+) anemia ,   Anesthesia Other Findings   Reproductive/Obstetrics (+) Pregnancy                             Anesthesia Physical Anesthesia Plan  ASA: II  Anesthesia Plan: Epidural   Post-op Pain Management:    Induction:   PONV Risk Score and Plan:   Airway Management Planned: Natural Airway  Additional Equipment:   Intra-op Plan:   Post-operative Plan:   Informed Consent: I have reviewed the patients History and Physical, chart, labs and discussed the procedure including the risks, benefits and alternatives for the proposed anesthesia with the patient or authorized representative who has indicated his/her understanding and acceptance.     Plan Discussed with: CRNA and Surgeon  Anesthesia Plan Comments:         Anesthesia Quick Evaluation

## 2018-06-21 NOTE — Anesthesia Procedure Notes (Signed)
Epidural Patient location during procedure: OB Start time: 06/21/2018 7:28 AM End time: 06/21/2018 7:32 AM  Staffing Anesthesiologist: Leilani Able, MD  Preanesthetic Checklist Completed: patient identified, site marked, surgical consent, pre-op evaluation, timeout performed, IV checked, risks and benefits discussed and monitors and equipment checked  Epidural Patient position: sitting Prep: site prepped and draped and DuraPrep Patient monitoring: continuous pulse ox and blood pressure Approach: midline Injection technique: LOR air  Needle:  Needle type: Tuohy  Needle gauge: 17 G Needle length: 9 cm and 9 Needle insertion depth: 6 cm Catheter type: closed end flexible Catheter size: 19 Gauge Catheter at skin depth: 11 cm Test dose: negative and Other  Assessment Sensory level: T9 Events: blood not aspirated, injection not painful, no injection resistance, negative IV test and no paresthesia  Additional Notes Reason for block:procedure for pain

## 2018-06-21 NOTE — Progress Notes (Signed)
POC discussed with pt. Pt verbalizes understanding and will notify RN of discomfort, pain, ROM, and bleeding.   

## 2018-06-22 LAB — CBC
HCT: 26.5 % — ABNORMAL LOW (ref 36.0–46.0)
Hemoglobin: 9 g/dL — ABNORMAL LOW (ref 12.0–15.0)
MCH: 29.8 pg (ref 26.0–34.0)
MCHC: 34 g/dL (ref 30.0–36.0)
MCV: 87.7 fL (ref 80.0–100.0)
Platelets: 173 10*3/uL (ref 150–400)
RBC: 3.02 MIL/uL — ABNORMAL LOW (ref 3.87–5.11)
RDW: 13.6 % (ref 11.5–15.5)
WBC: 7 10*3/uL (ref 4.0–10.5)
nRBC: 0 % (ref 0.0–0.2)

## 2018-06-22 MED ORDER — MAGNESIUM OXIDE 400 (241.3 MG) MG PO TABS
400.0000 mg | ORAL_TABLET | Freq: Every day | ORAL | Status: DC
  Administered 2018-06-23: 400 mg via ORAL
  Filled 2018-06-22 (×3): qty 1

## 2018-06-22 MED ORDER — POLYSACCHARIDE IRON COMPLEX 150 MG PO CAPS
150.0000 mg | ORAL_CAPSULE | Freq: Every day | ORAL | Status: DC
  Administered 2018-06-23: 150 mg via ORAL
  Filled 2018-06-22 (×2): qty 1

## 2018-06-22 NOTE — Lactation Note (Signed)
This note was copied from a baby's chart. Lactation Consultation Note: Mom reports baby has his second feeding about 1 hour ago. Mom reports he is very sleepy and she has tried to latch him through the night but he goes right off to sleep. Suggested hand expression and/or pumping and mom agreeable to pump. DEBP setup up and mom pumping as I left room. Reviewed setup use and cleaning of pump pieces. Encouraged to page for assist when baby wakes for next feeding. Parents want baby to be circ'd while here is hospital. Suggested waiting until he is feeding better before it gets done. No questions at present. Has Spectra pump for home.   Patient Name: Boy Ameyah Bangura [id number] Date: 06/22/2018 Reason for consult: Follow-up assessment   Maternal Data Formula Feeding for Exclusion: No Has patient been taught Hand Expression?: Yes Does the patient have breastfeeding experience prior to this delivery?: No  Feeding Feeding Type: Breast Fed  LATCH Score Latch: Too sleepy or reluctant, no latch achieved, no sucking elicited.  Audible Swallowing: None  Type of Nipple: Flat  Comfort (Breast/Nipple): Soft / non-tender  Hold (Positioning): Assistance needed to correctly position infant at breast and maintain latch.  LATCH Score: 4  Interventions    Lactation Tools Discussed/Used WIC Program: No Pump Review: Setup, frequency, and cleaning   Consult Status Consult Status: Follow-up Date: 06/22/18 Follow-up type: In-patient    Pamelia Hoit 06/22/2018, 10:15 AM

## 2018-06-22 NOTE — Progress Notes (Signed)
PPD #1, SVD, 2nd degree repair, baby boy   S:  Reports feeling okay, minimal discomfort              Tolerating po/ No nausea or vomiting / Denies dizziness or SOB             Bleeding is light             Pain controlled with Motrin             Up ad lib / ambulatory / voiding QS  Newborn breast feeding going okay; baby sleepy  / Circumcision - planning tomorrow  O:               VS: BP (!) 99/52   Pulse 83   Temp 97.7 F (36.5 C) (Oral)   Resp 18   Wt 106.9 kg   LMP 04/08/2017   SpO2 98%   Breastfeeding? Unknown   BMI 35.85 kg/m    LABS:              Recent Labs    06/20/18 2233 06/22/18 0633  WBC 11.0* 7.0  HGB 11.7* 9.0*  PLT 237 173               Blood type: --/--/A POS, A POS Performed at [facility], [address] [place], [place] [address]  [phone])  Rubella: Nonimmune (04/08 0000)                     I&O: Intake/Output      11/04 0701 - 11/05 0700 11/05 0701 - 11/06 0700   Urine (mL/kg/hr) 400 (0.2)    Blood 666    Total Output 1066    Net -1066         Flu UTD Declined Tdap              Physical Exam:             Alert and oriented X3  Lungs: Clear and unlabored  Heart: regular rate and rhythm / no murmurs  Abdomen: soft, non-tender, non-distended              Fundus: firm, non-tender, U-2  Perineum: well approximated 2nd degree repair, no significant edema, erythema or ecchymosis   Lochia: appropriate, no clots   Extremities: Trace pedal edema, no calf pain or tenderness    A: PPD # 1, SVD  2nd degree repair   ABL Anemia    Doing well - stable status  P: Routine post partum orders  See lactation today   Niferex 150mg  PO daily  Magnesium oxide 400mg  PO daily   Encouraged to rest when baby rests  May shower  Anticipate discharge home tomorrow Carlean Jews, MSN, CNM [facility]

## 2018-06-23 MED ORDER — IBUPROFEN 600 MG PO TABS: 600.0000 mg | ORAL_TABLET | Freq: Four times a day (QID) | ORAL | 0 refills | Status: DC

## 2018-06-23 MED ORDER — MEASLES, MUMPS & RUBELLA VAC IJ SOLR
0.5000 mL | Freq: Once | INTRAMUSCULAR | Status: AC
  Administered 2018-06-23: 0.5 mL via SUBCUTANEOUS
  Filled 2018-06-23 (×2): qty 0.5

## 2018-06-23 NOTE — Progress Notes (Signed)
PPD #1, SVD, 2nd degree repair, baby boy   S:         Reports feeling good, minimal discomfort              Tolerating po/ No nausea or vomiting / Denies dizziness or SOB             Bleeding is light             Pain controlled with Motrin             Up ad lib / ambulatory / voiding QS             Newborn breast feeding going better today  / Circumcision - completed  O:                     VS: Blood pressure (!) 104/52, pulse 85, temperature 98.1 F (36.7 C), temperature source Oral, resp. rate 20, weight 106.9 kg, last menstrual period 04/08/2017, SpO2 99 %, unknown if currently breastfeeding.              LABS:              RecentLabs(last2labs)  Recent Labs    06/20/18 2233 06/22/18 0633  WBC 11.0* 7.0  HGB 11.7* 9.0*  PLT 237 173                 Blood type: --/--/A POS, A POS Performed at [facility], [address] [place], [place] [address]  [phone] [id number])             Rubella: Nonimmune (04/08 0000)                                I&O: Intake/Output      11/04 0701 - 11/05 0700 11/05 0701 - 11/06 0700   Urine (mL/kg/hr) 400 (0.2)    Blood 666    Total Output 1066    Net -1066         Flu UTD Declined Tdap              Physical Exam:             Alert and oriented X3             Lungs: Clear and unlabored             Heart: regular rate and rhythm / no murmurs             Abdomen: soft, non-tender, non-distended              Fundus: firm, non-tender, U-2             Perineum: well approximated 2nd degree repair, no significant edema, erythema or ecchymosis              Lochia: appropriate, no clots              Extremities: Trace pedal edema, no calf pain or tenderness               A:         PPD # 2, SVD             2nd degree repair              ABL Anemia    Rubella Non-immune             Doing well -  stable status  P:         Routine post partum orders            Offer MMR vaccine prior to discharge    Discharge home  WOB  discharge book given, warning s/s and instructions reviewed   F/u with Dr. Ronita Hipps in 6 weeks   Lars Pinks, MSN, [facility]

## 2018-06-23 NOTE — Discharge Summary (Signed)
Obstetric Discharge Summary   Patient Name: Adriana Lewis DOB: [birth year] MRN: [id number]  Date of Admission: 06/20/2018 Date of Discharge: 06/23/2018 Date of Delivery: 06/21/18 Gestational Age at Delivery: [redacted]w[redacted]d  Primary OB: [facility] - Dr. Billy Coast   Antepartum complications:  - Rubella Non-immune - Cervical shortening  Prenatal Labs:  ABO, Rh: A/Positive/-- (04/08 0000) Antibody: n (04/08 0000) Rubella: Nonimmune (04/08 0000) RPR: Nonreactive (04/08 0000)  HBsAg: Negative (04/08 0000)  HIV: Non-reactive (04/08 0000)  GBS: Negative (10/14 0000)  Admitting Diagnosis: Labor at term   Secondary Diagnoses: Patient Active Problem List   Diagnosis Date Noted  . Term pregnancy 06/21/2018  . SVD (spontaneous vaginal delivery) 06/21/2018  . Second-degree perineal laceration, with delivery 06/21/2018  . Postpartum care following vaginal delivery (11/4) 06/21/2018  . Uterine contractions during pregnancy 06/20/2018  . Abdominal cramping 12/22/2016  . 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 Ganglion and cyst of synovium, tendon, and bursa 09/25/2010    Augmentation: None Complications: None  Date of Delivery: 06/21/18 Delivered By: Dr. Billy Coast Delivery Type: spontaneous vaginal delivery Anesthesia: epidural Placenta: sponatneous Laceration: 2nd degree, vaginal  Episiotomy: none  Newborn Data: Live born female  Birth Weight: 7 lb 10.4 oz (3470 g) APGAR: 9, 9  Newborn Delivery   Birth date/time:  06/21/2018 10:21:00 Delivery type:  Vaginal, Spontaneous        Hospital/Postpartum Course  (Vaginal Delivery): Pt. Admitted for prodromal labor.  See notes for details. Patient had an uncomplicated postpartum course.  By time of discharge on PPD#2, her pain was controlled on oral pain medications; she had appropriate lochia and was ambulating, voiding without difficulty and tolerating regular diet.  She was deemed stable for discharge to home.     Labs: CBC Latest  Ref Rng & Units 06/22/2018 06/20/2018 02/02/2017  WBC 4.0 - 10.5 K/uL 7.0 11.0(H) 5.1  Hemoglobin 12.0 - 15.0 g/dL 9.0(L) 11.7(L) 12.9  Hematocrit 36.0 - 46.0 % 26.5(L) 34.6(L) 36.9  Platelets 150 - 400 K/uL 173 237 220   Conflict (See Lab Report): A POS/A POS Performed at [facility], [address], [place], [place] [address]   Physical exam:  BP (!) 104/52 (BP Location: Right Arm)   Pulse 85   Temp 98.1 F (36.7 C) (Oral)   Resp 20   Wt 106.9 kg   LMP 04/08/2017   SpO2 99%   Breastfeeding? Unknown   BMI 35.85 kg/m  General: alert and no distress Pulm: normal respiratory effort Lochia: appropriate Abdomen: soft, NT Uterine Fundus: firm, below umbilicus Perineum: healing well, no significant erythema, no significant edema Incision: c/d/i, healing well, no significant drainage, no dehiscence, no significant erythema Extremities: No evidence of DVT seen on physical exam. No lower extremity edema.   Disposition: stable, discharge to home Baby Feeding: breast milk Baby Disposition: home with mom  Contraception: discussed, but undecided  Rh Immune globulin given: N/A Rubella vaccine given: offered prior to discharge Tdap vaccine given in AP or PP setting: Declined Flu vaccine given in AP or PP setting: UTD   Plan:  Tyanna Jemimah Cressy was discharged to home in good condition. Follow-up appointment at [facility] in 6 weeks.  Discharge Instructions: Per After Visit Summary. Refer to After Visit Summary and [facility] discharge booklet  Activity: Advance as tolerated. Pelvic rest for 6 weeks.   Diet: Regular, Heart Healthy Discharge Medications: Allergies as of 06/23/2018      Reactions   Latex Hives   Sulfa Antibiotics Hives  Medication List    TAKE these medications   ibuprofen 600 MG tablet Commonly known as:  ADVIL,MOTRIN Take 1 tablet (600 mg total) by mouth every 6 (six) hours.   PRENATAL ADULT GUMMY/DHA/FA PO Take 2 each by mouth  daily.            Discharge Care Instructions  (From admission, onward)         Start     Ordered   06/23/18 0000  Discharge wound care:    Comments:  Warm water sitz baths 3 times daily   06/23/18 1339         Outpatient follow up:  Follow-up Information    Olivia Mackie, MD. Schedule an appointment as soon as possible for a visit in 6 week(s).   Specialty:  Obstetrics and Gynecology Why:  Postpartum visit Contact information: [address] [place] [address] [phone]           Signed:  Carlean Jews, MSN, CNM [facility]

## 2018-06-23 NOTE — Lactation Note (Signed)
This note was copied from a baby's chart. Lactation Consultation Note  Patient Name: Adriana Lewis JYNWG'N Date: 06/23/2018 Reason for consult: Follow-up assessment Baby is in the nursery for circumcision.  Mom states baby started feeding well during the night.  Baby latching with ease and staying more active with feeding.  Instructed to call for feeding assessment with next feeding.  Latch scores earlier were 5-6.  Mom is hand expressing colostrum for baby.  Maternal Data    Feeding Feeding Type: Breast Fed  LATCH Score                   Interventions    Lactation Tools Discussed/Used     Consult Status Consult Status: Follow-up Date: 06/23/18 Follow-up type: In-patient    [name] 06/23/2018, 8:59 AM

## 2018-06-23 NOTE — Lactation Note (Signed)
This note was copied from a baby's chart. Lactation Consultation Note  Patient Name: Adriana Lewis ZOXWR'U Date: 06/23/2018 Reason for consult: Follow-up assessment Baby has returned to room from circumcision.  Assisted with waking techniques and positioning baby in football hold.  Baby remains sleepy and won't open mouth.  Instructed to watch for feeding cues, try skin to skin and call for next feeding attempt.  Maternal Data    Feeding Feeding Type: Breast Fed  LATCH Score Latch: Too sleepy or reluctant, no latch achieved, no sucking elicited.  Audible Swallowing: None  Type of Nipple: Everted at rest and after stimulation  Comfort (Breast/Nipple): Soft / non-tender  Hold (Positioning): Assistance needed to correctly position infant at breast and maintain latch.  LATCH Score: 5  Interventions    Lactation Tools Discussed/Used     Consult Status Consult Status: Follow-up Date: 06/23/18 Follow-up type: In-patient    [name] 06/23/2018, 10:16 AM

## 2018-06-23 NOTE — Lactation Note (Signed)
This note was copied from a baby's chart. Lactation Consultation Note  Patient Name: Adriana Lewis ZOXWR'U Date: 06/23/2018 Reason for consult: Follow-up assessment Assisted with positioning baby in football hold.  Mom can easily hand express colostrum into baby's mouth.  Baby more awake and showing feeding cues.  He latches easily but still becoming very sleepy during feeding.  Mom reports that he was much more active during the night.  Baby fed off and on for 10 minutes but no swallows identified.  Instructed mom to pump with DEBP x 15 minutes and give any colostrum obtained back to baby.  Will follow up this afternoon.  Maternal Data    Feeding Feeding Type: Breast Fed  LATCH Score Latch: Repeated attempts needed to sustain latch, nipple held in mouth throughout feeding, stimulation needed to elicit sucking reflex.  Audible Swallowing: None  Type of Nipple: Everted at rest and after stimulation  Comfort (Breast/Nipple): Soft / non-tender  Hold (Positioning): Assistance needed to correctly position infant at breast and maintain latch.  LATCH Score: 6  Interventions Interventions: Assisted with latch;Breast compression;Skin to skin;Adjust position;Breast massage;Support pillows;Hand express;DEBP  Lactation Tools Discussed/Used     Consult Status Consult Status: Follow-up Date: 06/23/18 Follow-up type: In-patient    [name] 06/23/2018, 12:20 PM

## 2018-06-24 ENCOUNTER — Ambulatory Visit: Payer: Self-pay

## 2018-06-24 NOTE — Lactation Note (Addendum)
This note was copied from a baby's chart. Lactation Consultation Note  Patient Name: Adriana Lewis Date: 06/24/2018 Reason for consult: Follow-up assessment;Mother's request;1st time breastfeeding;MD order;Hyperbilirubinemia;Infant weight loss;Early term 37-38.6wks   Follow up with mom of 75 hour old infant. Infant was latched to the right breast without the NS when LC entered room. Mom reports he last ate at 9 am prior to this feeding. Mom reports he has been nursing for 20 minutes although mom had just finished pumping. Infant active with intermittent swallows noted. Infant then latched to the left breast in the football hold. Infant fed for about 10 minutes with the # 24 NS and self detached, milk was noted in the NS. Infant supplemented with 10 cc EBM via syringe and finger feeding. Mom was feeding with syringe without finger, enc her to have infant suckling on finger with syringe feeding.   Mom is icing and pumping and getting increased volumes of EBM. Left breast with knots, right breast has softened. Enc mom to make sure infant is supplemented with at least 30 ml EBM and/or Formula if he is not willing to latch. Infant can have more if he wants it. Mom voiced understanding.   Mom to call out for further assistance as needed.    Maternal Data Formula Feeding for Exclusion: No Has patient been taught Hand Expression?: Yes Does the patient have breastfeeding experience prior to this delivery?: No  Feeding Feeding Type: Breast Fed  LATCH Score Latch: Grasps breast easily, tongue down, lips flanged, rhythmical sucking.  Audible Swallowing: Spontaneous and intermittent  Type of Nipple: Everted at rest and after stimulation  Comfort (Breast/Nipple): Soft / non-tender  Hold (Positioning): Assistance needed to correctly position infant at breast and maintain latch.  LATCH Score: 9  Interventions Interventions: Breast feeding basics reviewed;Support pillows;Assisted with  latch;Breast massage;Breast compression;DEBP;Expressed milk  Lactation Tools Discussed/Used Tools: Shells;Pump;Nipple Shields Nipple shield size: 24 Shell Type: Inverted Breast pump type: Double-Electric Breast Pump Pump Review: Setup, frequency, and cleaning;Milk Storage Initiated by:: Reviewed and encouraged every 2-3 hours post BF   Consult Status Consult Status: Follow-up Date: 06/25/18 Follow-up type: In-patient    Silas Flood Hice 06/24/2018, 3:03 PM

## 2018-06-24 NOTE — Lactation Note (Signed)
This note was copied from a baby's chart. Lactation Consultation Note  Patient Name: Adriana Lewis ZOXWR'U Date: 06/24/2018 Reason for consult: Follow-up assessment;Engorgement Breasts are engorged.  Mom just pumped on initiation setting and obtained 10 mls.  She used hands on pumping.  Ice packs given for her to use for 30 minutes every 2 hours.  Instructed to pump every 2 hours on standard setting using hands on pumping.  Maternal Data    Feeding Feeding Type: Breast Fed  LATCH Score Latch: Grasps breast easily, tongue down, lips flanged, rhythmical sucking.  Audible Swallowing: Spontaneous and intermittent  Type of Nipple: Everted at rest and after stimulation  Comfort (Breast/Nipple): Filling, red/small blisters or bruises, mild/mod discomfort  Hold (Positioning): No assistance needed to correctly position infant at breast.  LATCH Score: 9  Interventions    Lactation Tools Discussed/Used     Consult Status      Adriana Lewis 06/24/2018, 12:36 PM

## 2018-06-24 NOTE — Lactation Note (Addendum)
This note was copied from a baby's chart. Lactation Consultation Note  Patient Name: Boy Shameika Speelman ZOXWR'U Date: 06/24/2018    Baby 70 hours old.  8.8% Mother is using #24NS on R breast due to difficulty latching but feels latching has improved. Mother had been pumped w/ DEBP but did not through the night and gave formula. Encouraged her to continue post pumping and supplementing until jaundice and weight stabilizes. Mother has DEBP at home.  Breasts are filling.  She is using coconut oil for tender nipples. Recommend prepumping to latch on R side and wearing shells.  Reviewed engorgement care and monitoring voids/stools. Provided mother with another #24NS and discussed how to wean off NS.   Mother has lactation phone number is she needs help with latching. Mom encouraged to feed baby 8-12 times/24 hours and with feeding cues.          Maternal Data    Feeding Feeding Type: Formula  LATCH Score                   Interventions    Lactation Tools Discussed/Used     Consult Status      Hardie Pulley 06/24/2018, 9:12 AM

## 2018-06-25 ENCOUNTER — Ambulatory Visit: Payer: Self-pay

## 2018-06-25 ENCOUNTER — Inpatient Hospital Stay (HOSPITAL_COMMUNITY): Admission: RE | Admit: 2018-06-25 | Payer: BC Managed Care – PPO | Source: Ambulatory Visit

## 2018-06-25 NOTE — Lactation Note (Signed)
This note was copied from a baby's chart. Lactation Consultation Note  Patient Name: Adriana Lewis ZOXWR'U Date: 06/25/2018 Reason for consult: Follow-up assessment;Hyperbilirubinemia Mom reports feedings are going well.  She post pumps after most feeds and syringe feeds EBM.  Baby has gained weight and bilirubin level is down.  Breast engorgement resolved.  Observed mom latch baby after a few attempts.  Active suck/swallows.  Instructed to continue to feed with cues, post pump and supplement with expressed milk.  Mom denies questions.  Lactation outpatient services and support information reviewed and encouraged prn.  Maternal Data    Feeding Feeding Type: Breast Fed  LATCH Score Latch: Grasps breast easily, tongue down, lips flanged, rhythmical sucking.  Audible Swallowing: Spontaneous and intermittent  Type of Nipple: Everted at rest and after stimulation  Comfort (Breast/Nipple): Soft / non-tender  Hold (Positioning): No assistance needed to correctly position infant at breast.  LATCH Score: 10  Interventions    Lactation Tools Discussed/Used     Consult Status Consult Status: Complete Follow-up type: Call as needed    [name] 06/25/2018, 8:59 AM

## 2018-10-10 ENCOUNTER — Ambulatory Visit
Admission: EM | Admit: 2018-10-10 | Discharge: 2018-10-10 | Disposition: A | Discharge status: Home / Self Care | Payer: BC Managed Care – PPO

## 2018-10-10 ENCOUNTER — Other Ambulatory Visit: Payer: Self-pay

## 2018-10-10 ENCOUNTER — Encounter: Payer: Self-pay | Admitting: Gynecology

## 2018-10-10 DIAGNOSIS — B9789 Other viral agents as the cause of diseases classified elsewhere: Secondary | ICD-10-CM

## 2018-10-10 DIAGNOSIS — J039 Acute tonsillitis, unspecified: Secondary | ICD-10-CM | POA: Diagnosis not present

## 2018-10-10 DIAGNOSIS — J0100 Acute maxillary sinusitis, unspecified: Secondary | ICD-10-CM | POA: Diagnosis not present

## 2018-10-10 HISTORY — DX: Anxiety disorder, unspecified: F41.9

## 2018-10-10 LAB — RAPID STREP SCREEN (MED CTR MEBANE ONLY): Streptococcus, Group A Screen (Direct): NEGATIVE

## 2018-10-10 MED ORDER — CEFDINIR 300 MG PO CAPS: 300.0000 mg | ORAL_CAPSULE | Freq: Two times a day (BID) | ORAL | 0 refills | Status: AC

## 2018-10-10 NOTE — ED Triage Notes (Signed)
Patient c/o sinus infection. 

## 2018-10-10 NOTE — ED Provider Notes (Signed)
[facility]    CSN: [id number] Arrival date & time: 10/10/18  0803     History   Chief Complaint No chief complaint on file.   HPI Adriana Lewis is a 23 y.o. female. Patient is a breastfeeding female who presents for >1 week history of left maxillary sinus pressure/pain, nasal congestion, cough, fatigue, and mild sore throat. She has not taken any OTC medications since she is hoping to avoid medications that can affect breastfeeding. Patient states that she gets a sinus infection about once yearly and has to have an antibiotic, such as azithromycin. She presently denies fever, chills, sweats, ear pain, chest pressure/tightness, and breathing difficulties. She is otherwise healthy and not presently taking any medications. Denies recent travel. There are no other complaints/concerns today.  HPI  Past Medical History:  Diagnosis Date  . Anxiety   . Irregular heartbeat    pt reports history of palpitations    Patient Active Problem List   Diagnosis Date Noted  . Term pregnancy 06/21/2018  . SVD (spontaneous vaginal delivery) 06/21/2018  . Second-degree perineal laceration, with delivery 06/21/2018  . Postpartum care following vaginal delivery (11/4) 06/21/2018  . Uterine contractions during pregnancy 06/20/2018  . Abdominal cramping 12/22/2016  . 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 Ganglion and cyst of synovium, tendon, and bursa 09/25/2010    Past Surgical History:  Procedure Laterality Date  . DILATION AND EVACUATION N/A 02/02/2017   Procedure: DILATATION AND EVACUATION;  Surgeon: Linzie Collin, MD;  Location: [facility] ORS;  Service: Gynecology;  Laterality: N/A;  . UPPER GI ENDOSCOPY    . WISDOM TOOTH EXTRACTION      OB History    Gravida  2   Para  1   Term  1   Preterm  0   AB  1   Living  1     SAB  1   TAB  0   Ectopic  0   Multiple  0   Live Births  1            Home Medications    Prior to Admission  medications   Medication Sig Start Date End Date Taking? Authorizing Provider  ibuprofen (ADVIL,MOTRIN) 600 MG tablet Take 1 tablet (600 mg total) by mouth every 6 (six) hours. 06/23/18  Yes Sigmon, Meredith C, CNM  NORLYDA 0.35 MG tablet TK 1 T PO D 09/27/18  Yes [provider]  sertraline (ZOLOFT) 25 MG tablet TK 1 T PO D 08/22/18  Yes [provider]  Prenatal MV & Min w/FA-DHA (PRENATAL ADULT GUMMY/DHA/FA PO) Take 2 each by mouth daily.    [provider]    Family History Family History  Problem Relation Age of Onset  . Diabetes Mother   . Hypertension Mother   . Varicose Veins Mother   . Hepatitis B Father   . Cirrhosis Father   . Diabetes Sister   . Hypertension Sister   . Heart disease Maternal Grandmother   . Cancer Paternal Grandmother        stomach  . Diabetes Paternal Grandfather   . COPD Paternal Grandfather     Social History Social History   Tobacco Use  . Smoking status: Never Smoker  . Smokeless tobacco: Never Used  Substance Use Topics  . Alcohol use: No    Comment: rarely 2-3 glasses of wine per year  . Drug use: No     Allergies   Latex and Sulfa antibiotics  Review of Systems Review of Systems  Constitutional: Positive for fatigue. Negative for chills and fever.  HENT: Positive for congestion, rhinorrhea, sinus pressure, sinus pain and sore throat. Negative for ear pain, postnasal drip and trouble swallowing.   Eyes: Positive for discharge (clear to light yellow drainage only at night from left eye). Negative for photophobia, pain, redness and itching.  Respiratory: Positive for cough. Negative for shortness of breath and wheezing.   Cardiovascular: Negative for chest pain.  Gastrointestinal: Negative for abdominal pain, nausea and vomiting.  Musculoskeletal: Negative for arthralgias and myalgias.  Skin: Negative for rash.  Neurological: Negative for dizziness, weakness and headaches.     Physical Exam Triage  Vital Signs ED Triage Vitals  Enc Vitals Group     BP 10/10/18 0821 135/79     Pulse Rate 10/10/18 0821 94     Resp 10/10/18 0821 18     Temp 10/10/18 0821 97.7 F (36.5 C)     Temp Source 10/10/18 0821 Oral     SpO2 10/10/18 0821 100 %     Weight 10/10/18 0820 210 lb (95.3 kg)     Height 10/10/18 0820 5\' 7"  (1.702 m)     Head Circumference --      Peak Flow --      Pain Score 10/10/18 0820 1     Pain Loc --      Pain Edu? --      Excl. in GC? --    No data found.  Updated Vital Signs BP 135/79 (BP Location: Left Arm)   Pulse 94   Temp 97.7 F (36.5 C) (Oral)   Resp 18   Ht 5\' 7"  (1.702 m)   Wt 210 lb (95.3 kg)   LMP 09/16/2018   SpO2 100%   Breastfeeding Yes   BMI 32.89 kg/m      Physical Exam Vitals signs and nursing note reviewed.  Constitutional:      General: She is not in acute distress.    Appearance: Normal appearance. She is normal weight. She is not ill-appearing.  HENT:     Head: Normocephalic and atraumatic.     Right Ear: Tympanic membrane, ear canal and external ear normal.     Left Ear: Tympanic membrane, ear canal and external ear normal.     Nose: Congestion and rhinorrhea (trace clear drainage) present.     Right Sinus: No maxillary sinus tenderness or frontal sinus tenderness.     Left Sinus: Maxillary sinus tenderness present. No frontal sinus tenderness.     Mouth/Throat:     Mouth: Mucous membranes are moist.     Pharynx: Posterior oropharyngeal erythema present. No oropharyngeal exudate (left whitish tonsillar exudates).     Tonsils: Tonsillar exudate (left whitish tonsillar exudates) present. Swelling: 0 on the right. 1+ on the left.  Eyes:     General: No scleral icterus.       Right eye: No discharge.        Left eye: No discharge.     Conjunctiva/sclera: Conjunctivae normal.     Pupils: Pupils are equal, round, and reactive to light.  Cardiovascular:     Rate and Rhythm: Normal rate and regular rhythm.     Heart sounds: Normal  heart sounds. No murmur.  Pulmonary:     Effort: Respiratory distress present.     Breath sounds: Normal breath sounds. No wheezing or rhonchi.  Lymphadenopathy:     Cervical: Cervical adenopathy (bilat left ant cervical enlarged nodes)  present.  Skin:    General: Skin is warm and dry.     Findings: No rash.  Neurological:     Mental Status: She is alert.  Psychiatric:        Mood and Affect: Mood normal.        Behavior: Behavior normal.        Thought Content: Thought content normal.      UC Treatments / Results  Labs (all labs ordered are listed, but only abnormal results are displayed) Labs Reviewed  RAPID STREP SCREEN ([facility] ONLY)  CULTURE, GROUP A STREP [facility])    EKG None  Radiology No results found.  Procedures Procedures (including critical care time)  Medications Ordered in UC Medications - No data to display  Initial Impression / Assessment and Plan / UC Course  I have reviewed the triage vital signs and the nursing notes.  Pertinent labs & imaging results that were available during my care of the patient were reviewed by me and considered in my medical decision making (see chart for details).    Final Clinical Impressions(s) / UC Diagnoses   Final diagnoses:  Acute maxillary sinusitis, recurrence not specified  Acute tonsillitis, unspecified etiology     Discharge Instructions     SINUSITIS: Reviewed use of a nasal saline irrigation system. May consider use of intranasal steroids, such as Flonase as well. These are safe with pregnancy and should not affect milk supply. Use medications as directed. If antibiotics are prescribed, take the full course of antibiotics. Also consider use of Sudafed or Mucinex D. Take a low dose and as infrequently as possible because this can dry your milk supply. Increase rest, fluids. If you do not improve or if you worsen after a course of antibiotics, you should be re-examined. You may need a different  antibiotic or further evaluation with imaging or an exam of the inside of the sinuses   SORE THROAT: The treatment of sore throat depends upon the cause; strep throat is treated with an antibiotic, while viral pharyngitis is treated with rest, pain relievers. If prescribed, finish the entire course of antibiotics .Increase rest, fluids, and OTC meds as needed . If you have any questions or concerns, please call us or stop back at any time and we will be happy to help you. If your symptoms worsen, f/u with our office or go to the ER     ED Prescriptions    None     Controlled Substance Prescriptions  Controlled Substance Registry consulted? Not Applicable   Gareth Morgan 10/10/18 2244

## 2018-10-10 NOTE — Discharge Instructions (Addendum)
SINUSITIS: Reviewed use of a nasal saline irrigation system. May consider use of intranasal steroids, such as Flonase as well. These are safe with pregnancy and should not affect milk supply. Use medications as directed. If antibiotics are prescribed, take the full course of antibiotics. Also consider use of Sudafed or Mucinex D. Take a low dose and as infrequently as possible because this can dry your milk supply. Increase rest, fluids. If you do not improve or if you worsen after a course of antibiotics, you should be re-examined. You may need a different antibiotic or further evaluation with imaging or an exam of the inside of the sinuses   SORE THROAT: The treatment of sore throat depends upon the cause; strep throat is treated with an antibiotic, while viral pharyngitis is treated with rest, pain relievers. If prescribed, finish the entire course of antibiotics .Increase rest, fluids, and OTC meds as needed . If you have any questions or concerns, please call us or stop back at any time and we will be happy to help you. If your symptoms worsen, f/u with our office or go to the ER

## 2018-10-13 LAB — CULTURE, GROUP A STREP (THRC)

## 2019-11-13 IMAGING — US US FOLLICLE
1 series · 15 of 16 positions shown · non-contrast
Comparison: None.

CLINICAL DATA: Oligomenorrhea

EXAM:
TRANSVAGINAL ULTRASOUND OF PELVIS (FOLLICLE STUDY)
TECHNIQUE: Transvaginal ultrasound examination of the pelvis was performed
including evaluation of the uterus, ovaries, adnexal regions, and
pelvic cul-de-sac.

[Series 1: us follicle · 15 of 34 slices shown]
[im 1/34]
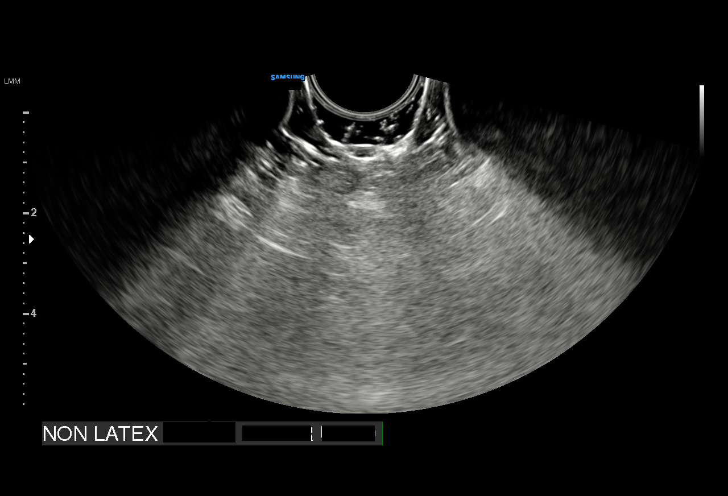
[im 3/34]
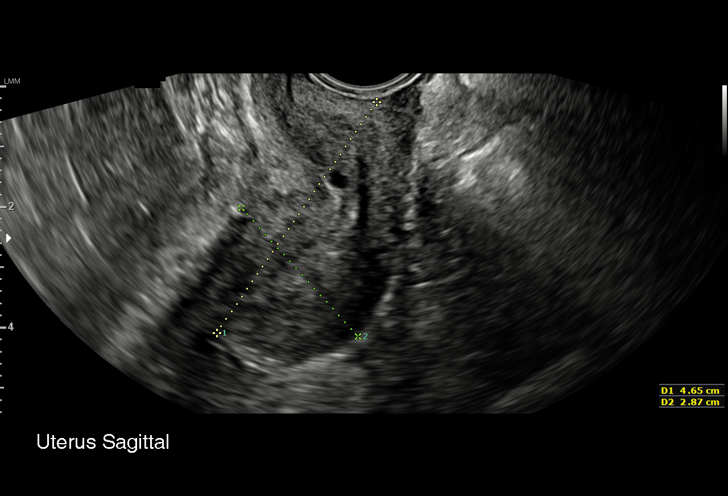
[im 5/34]
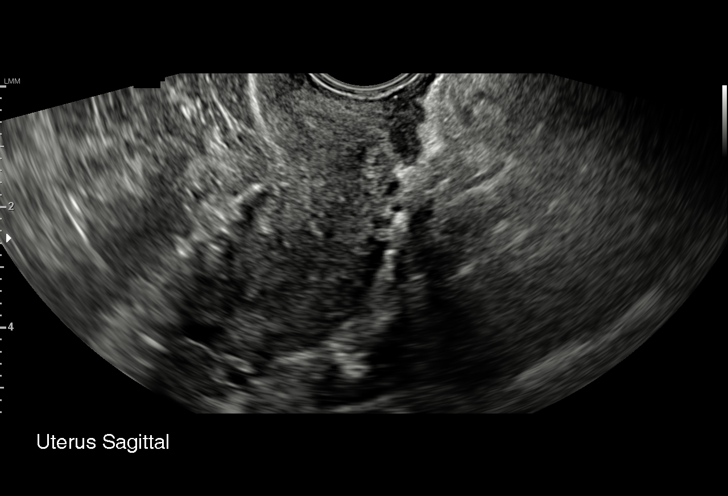
[im 7/34]
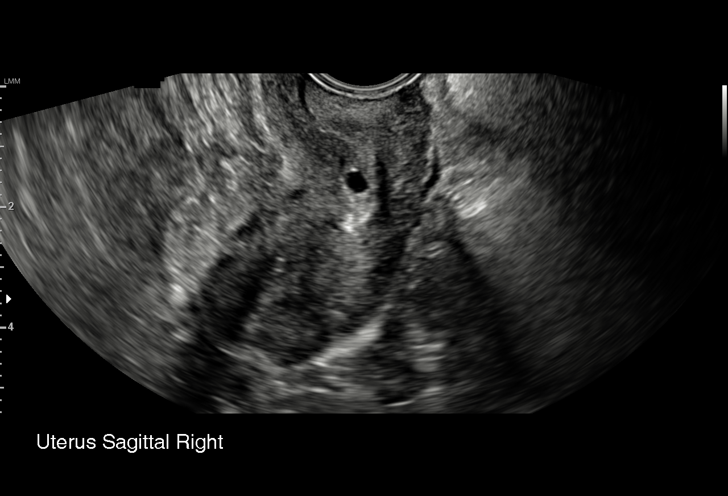
[im 9/34]
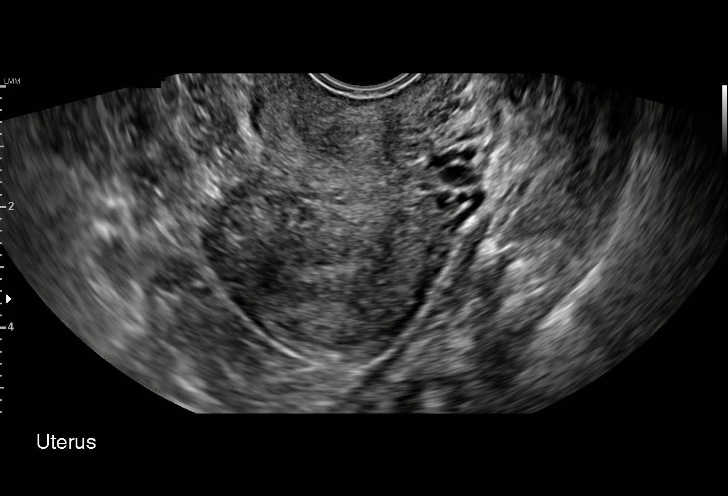
[im 12/34]
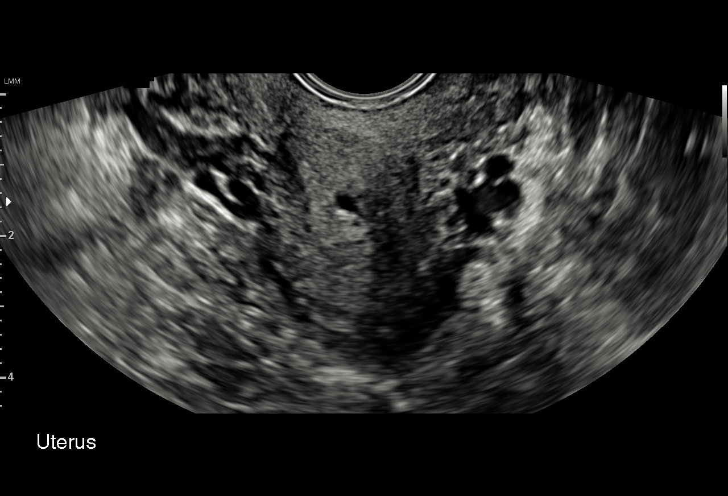
[im 14/34]
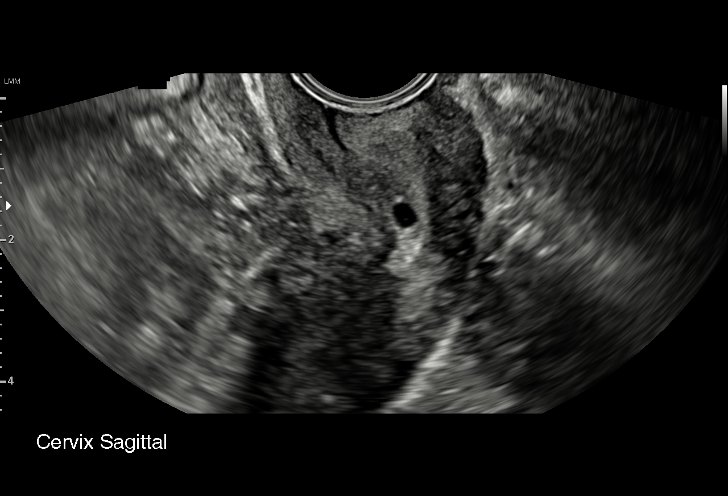
[im 18/34]
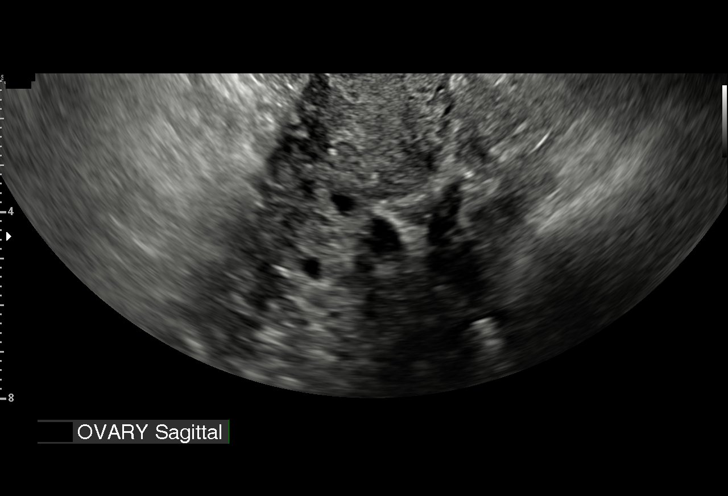
[im 20/34]
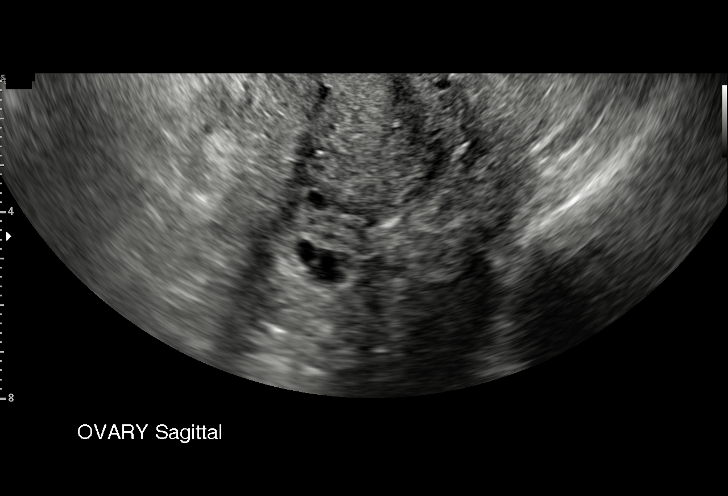
[im 23/34]
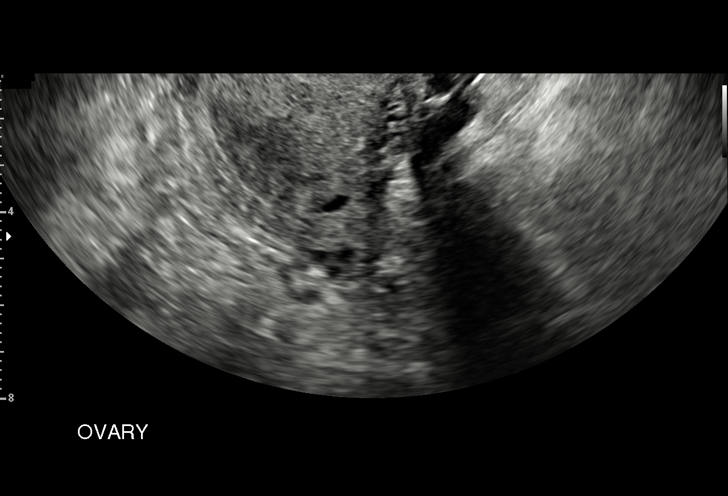
[im 25/34]
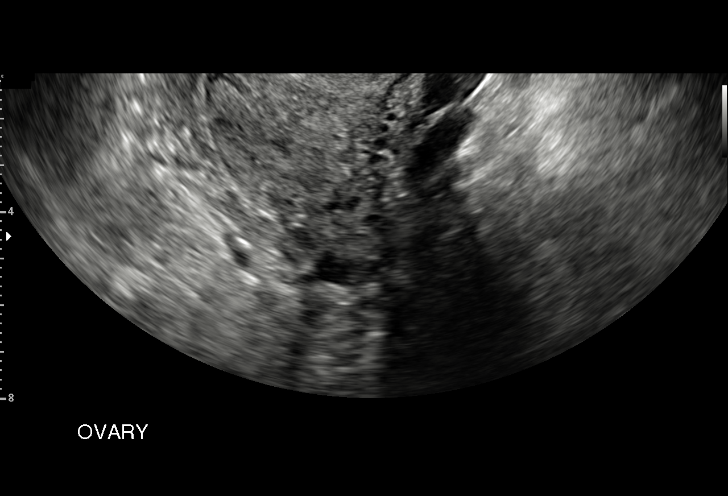
[im 27/34]
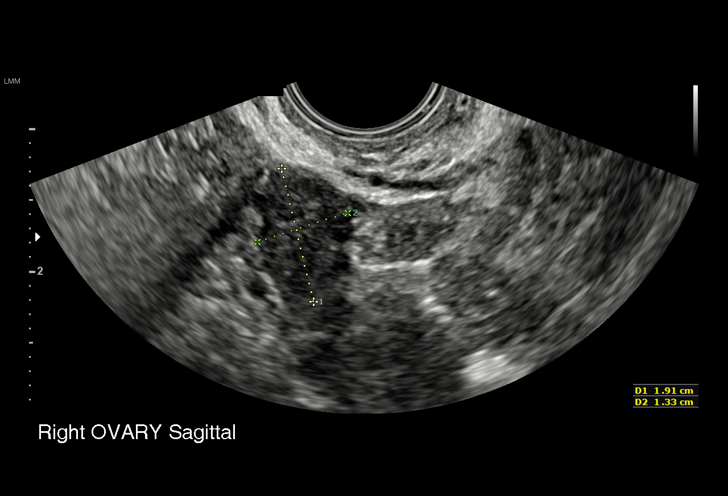
[im 29/34]
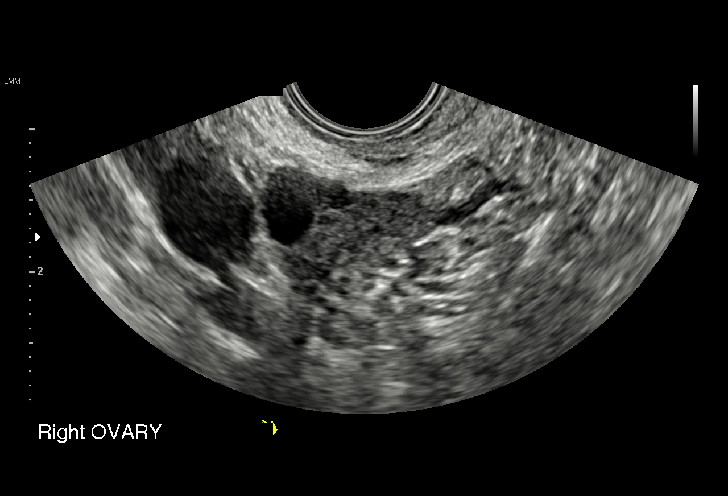
[im 31/34]
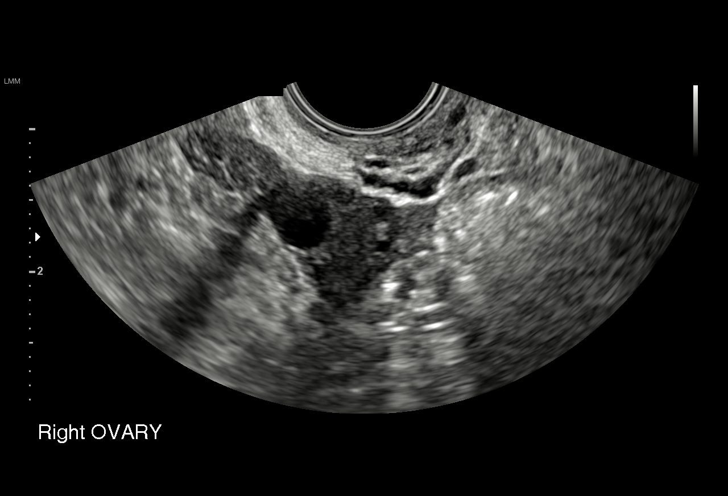
[im 34/34]
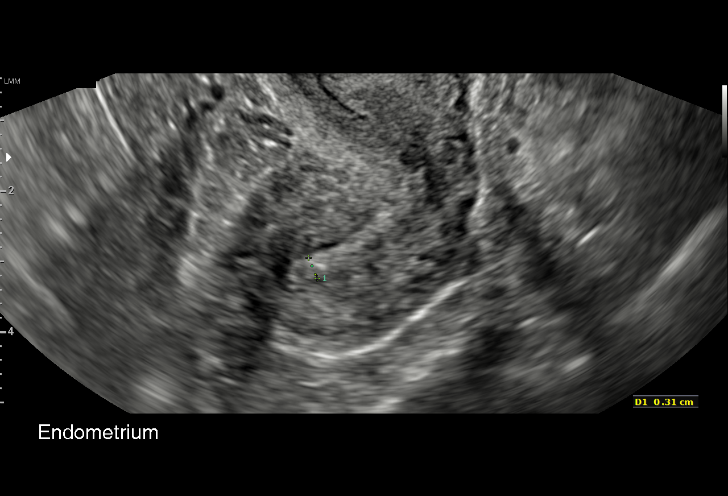

[15 of 16 positions shown; findings below may reference images not displayed]

FINDINGS: Uterus

No fibroids or other mass visualized.

Endometrium

Thickness: 3 mm in thickness.  No focal abnormality visualized.

Right ovary

Follicles measuring 10mm or greater: 0

Estimated number of follicles <10mm: 1

Left ovary

Follicles measuring 10mm or greater: 0

Estimated number of follicles <10mm: 3-4

Other findings:  No abnormal free fluid
IMPRESSION: Small follicles in the ovaries as above. No follicles greater than
10 mm.

## 2020-01-05 ENCOUNTER — Other Ambulatory Visit: Payer: Self-pay | Admitting: Neurosurgery

## 2020-01-09 ENCOUNTER — Encounter
Admission: RE | Admit: 2020-01-09 | Discharge: 2020-01-09 | Disposition: A | Discharge status: Home / Self Care | Payer: Managed Care, Other (non HMO) | Source: Ambulatory Visit | Attending: Neurosurgery | Admitting: Neurosurgery

## 2020-01-09 ENCOUNTER — Other Ambulatory Visit: Payer: Self-pay

## 2020-01-09 NOTE — Patient Instructions (Signed)
Your procedure is scheduled on: 01/11/20 Report to [place]. To find out your arrival time please call [phone] between 1PM - 3PM on 01/10/20.  Remember: Instructions that are not followed completely may result in serious medical risk, up to and including death, or upon the discretion of your surgeon and anesthesiologist your surgery may need to be rescheduled.     _X__ 1. Do not eat food after midnight the night before your procedure.                 No gum chewing or hard candies. You may drink clear liquids up to 2 hours                 before you are scheduled to arrive for your surgery- DO not drink clear                 liquids within 2 hours of the start of your surgery.                 Clear Liquids include:  water, apple juice without pulp, clear carbohydrate                 drink such as Clearfast or Gatorade, Black Coffee or Tea (Do not add                 anything to coffee or tea). Diabetics water only  __X__2.  On the morning of surgery brush your teeth with toothpaste and water, you                 may rinse your mouth with mouthwash if you wish.  Do not swallow any              toothpaste of mouthwash.     _X__ 3.  No Alcohol for 24 hours before or after surgery.   _X__ 4.  Do Not Smoke or use e-cigarettes For 24 Hours Prior to Your Surgery.                 Do not use any chewable tobacco products for at least 6 hours prior to                 surgery.  ____  5.  Bring all medications with you on the day of surgery if instructed.   __X__  6.  Notify your doctor if there is any change in your medical condition      (cold, fever, infections).     Do not wear jewelry, make-up, hairpins, clips or nail polish. Do not wear lotions, powders, or perfumes.  Do not shave 48 hours prior to surgery. Men may shave face and neck. Do not bring valuables to the hospital.    [facility] is not responsible for any belongings or  valuables.  Contacts, dentures/partials or body piercings may not be worn into surgery. Bring a case for your contacts, glasses or hearing aids, a denture cup will be supplied. Leave your suitcase in the car. After surgery it may be brought to your room. For patients admitted to the hospital, discharge time is determined by your treatment team.   Patients discharged the day of surgery will not be allowed to drive home.   Please read over the following fact sheets that you were given:   MRSA Information  __X__ Take these medicines the morning of surgery with A SIP OF WATER:  1. sertraline (ZOLOFT) 75 MG tablet  2.   3.   4.  5.  6.  ____ Fleet Enema (as directed)   __X__ Use CHG Soap/SAGE wipes as directed  ____ Use inhalers on the day of surgery  ____ Stop metformin/Janumet/Farxiga 2 days prior to surgery    ____ Take 1/2 of usual insulin dose the night before surgery. No insulin the morning          of surgery.   ____ Stop Blood Thinners Coumadin/Plavix/Xarelto/Pleta/Pradaxa/Eliquis/Effient/Aspirin  on   Or contact your Surgeon, Cardiologist or Medical Doctor regarding  ability to stop your blood thinners  __X__ Stop Anti-inflammatories 7 days before surgery such as Advil, Ibuprofen, Motrin,  BC or Goodies Powder, Naprosyn, Naproxen, Aleve, Aspirin    __X__ Stop all herbal supplements, fish oil or vitamin E until after surgery.    ____ Bring C-Pap to the hospital.

## 2020-01-10 ENCOUNTER — Other Ambulatory Visit
Admission: RE | Admit: 2020-01-10 | Discharge: 2020-01-10 | Disposition: A | Discharge status: Home / Self Care | Payer: Managed Care, Other (non HMO) | Source: Ambulatory Visit | Attending: Neurosurgery | Admitting: Neurosurgery

## 2020-01-10 ENCOUNTER — Encounter: Payer: Self-pay | Admitting: Neurosurgery

## 2020-01-10 DIAGNOSIS — Z20822 Contact with and (suspected) exposure to covid-19: Secondary | ICD-10-CM | POA: Diagnosis not present

## 2020-01-10 DIAGNOSIS — Z01812 Encounter for preprocedural laboratory examination: Secondary | ICD-10-CM | POA: Diagnosis not present

## 2020-01-10 LAB — TYPE AND SCREEN
ABO/RH(D): A POS
Antibody Screen: NEGATIVE

## 2020-01-10 LAB — SARS CORONAVIRUS 2 (TAT 6-24 HRS): SARS Coronavirus 2: NEGATIVE

## 2020-01-10 LAB — SURGICAL PCR SCREEN
MRSA, PCR: NEGATIVE
Staphylococcus aureus: NEGATIVE

## 2020-01-10 LAB — APTT: aPTT: 28 seconds (ref 24–36)

## 2020-01-11 ENCOUNTER — Ambulatory Visit: Payer: Managed Care, Other (non HMO) | Admitting: Certified Registered Nurse Anesthetist

## 2020-01-11 ENCOUNTER — Encounter: Payer: Self-pay | Admitting: Neurosurgery

## 2020-01-11 ENCOUNTER — Ambulatory Visit: Payer: Managed Care, Other (non HMO)

## 2020-01-11 ENCOUNTER — Observation Stay
Admission: RE | Admit: 2020-01-11 | Discharge: 2020-01-11 | Disposition: A | Discharge status: Home / Self Care | Payer: Managed Care, Other (non HMO) | Attending: Neurosurgery | Admitting: Neurosurgery

## 2020-01-11 ENCOUNTER — Encounter
Admission: RE | Disposition: A | Discharge status: Home / Self Care | Payer: Self-pay | Source: Home / Self Care | Attending: Neurosurgery

## 2020-01-11 ENCOUNTER — Other Ambulatory Visit: Payer: Self-pay

## 2020-01-11 DIAGNOSIS — M5116 Intervertebral disc disorders with radiculopathy, lumbar region: Principal | ICD-10-CM | POA: Insufficient documentation

## 2020-01-11 DIAGNOSIS — M5416 Radiculopathy, lumbar region: Secondary | ICD-10-CM | POA: Diagnosis present

## 2020-01-11 DIAGNOSIS — Z419 Encounter for procedure for purposes other than remedying health state, unspecified: Secondary | ICD-10-CM

## 2020-01-11 DIAGNOSIS — E669 Obesity, unspecified: Secondary | ICD-10-CM | POA: Insufficient documentation

## 2020-01-11 DIAGNOSIS — Z6836 Body mass index (BMI) 36.0-36.9, adult: Secondary | ICD-10-CM | POA: Insufficient documentation

## 2020-01-11 DIAGNOSIS — M1990 Unspecified osteoarthritis, unspecified site: Secondary | ICD-10-CM | POA: Diagnosis not present

## 2020-01-11 HISTORY — PX: LUMBAR LAMINECTOMY/DECOMPRESSION MICRODISCECTOMY: SHX5026

## 2020-01-11 LAB — POCT PREGNANCY, URINE: Preg Test, Ur: NEGATIVE

## 2020-01-11 LAB — APTT: aPTT: 29 seconds (ref 24–36)

## 2020-01-11 LAB — ABO/RH: ABO/RH(D): A POS

## 2020-01-11 SURGERY — LUMBAR LAMINECTOMY/DECOMPRESSION MICRODISCECTOMY 1 LEVEL
Anesthesia: General | Laterality: Left

## 2020-01-11 MED ORDER — ACETAMINOPHEN 325 MG PO TABS: 650.0000 mg | ORAL_TABLET | ORAL | Status: DC | PRN

## 2020-01-11 MED ORDER — SENNA 8.6 MG PO TABS: 1.0000 | ORAL_TABLET | Freq: Two times a day (BID) | ORAL | 0 refills | Status: DC

## 2020-01-11 MED ORDER — ONDANSETRON HCL 4 MG/2ML IJ SOLN: 4.0000 mg | Freq: Once | INTRAMUSCULAR | Status: DC | PRN

## 2020-01-11 MED ORDER — LACTATED RINGERS IV SOLN: INTRAVENOUS | Status: DC

## 2020-01-11 MED ORDER — THROMBIN 5000 UNITS EX SOLR
CUTANEOUS | Status: DC | PRN
  Administered 2020-01-11: 5000 [IU] via TOPICAL

## 2020-01-11 MED ORDER — SENNA 8.6 MG PO TABS
1.0000 | ORAL_TABLET | Freq: Two times a day (BID) | ORAL | Status: DC
  Administered 2020-01-11: 8.6 mg via ORAL
  Filled 2020-01-11: qty 1

## 2020-01-11 MED ORDER — SODIUM CHLORIDE 0.9 % IV SOLN: 250.0000 mL | INTRAVENOUS | Status: DC

## 2020-01-11 MED ORDER — POLYETHYLENE GLYCOL 3350 17 G PO PACK
17.0000 g | PACK | Freq: Every day | ORAL | Status: DC | PRN
  Filled 2020-01-11: qty 1

## 2020-01-11 MED ORDER — BUPIVACAINE-EPINEPHRINE (PF) 0.5% -1:200000 IJ SOLN
INTRAMUSCULAR | Status: DC | PRN
  Administered 2020-01-11: 6 mL

## 2020-01-11 MED ORDER — ONDANSETRON HCL 4 MG/2ML IJ SOLN: 4.0000 mg | Freq: Four times a day (QID) | INTRAMUSCULAR | Status: DC | PRN

## 2020-01-11 MED ORDER — POLYETHYLENE GLYCOL 3350 17 G PO PACK: 17.0000 g | PACK | Freq: Every day | ORAL | 0 refills | Status: DC | PRN

## 2020-01-11 MED ORDER — DEXMEDETOMIDINE HCL 200 MCG/2ML IV SOLN
INTRAVENOUS | Status: DC | PRN
  Administered 2020-01-11 (×3): 4 ug via INTRAVENOUS

## 2020-01-11 MED ORDER — ORAL CARE MOUTH RINSE: 15.0000 mL | Freq: Once | OROMUCOSAL | Status: DC

## 2020-01-11 MED ORDER — ONDANSETRON HCL 4 MG PO TABS: 4.0000 mg | ORAL_TABLET | Freq: Four times a day (QID) | ORAL | Status: DC | PRN

## 2020-01-11 MED ORDER — FENTANYL CITRATE (PF) 100 MCG/2ML IJ SOLN
INTRAMUSCULAR | Status: AC
  Administered 2020-01-11: 25 ug via INTRAVENOUS
  Filled 2020-01-11: qty 2

## 2020-01-11 MED ORDER — SODIUM CHLORIDE 0.9 % IR SOLN
Status: DC | PRN
  Administered 2020-01-11: 1000 mL

## 2020-01-11 MED ORDER — METHOCARBAMOL 500 MG PO TABS: 500.0000 mg | ORAL_TABLET | Freq: Four times a day (QID) | ORAL | 0 refills | Status: AC | PRN

## 2020-01-11 MED ORDER — PROPOFOL 10 MG/ML IV BOLUS
INTRAVENOUS | Status: DC | PRN
  Administered 2020-01-11: 50 mg via INTRAVENOUS
  Administered 2020-01-11: 200 mg via INTRAVENOUS

## 2020-01-11 MED ORDER — METHYLPREDNISOLONE ACETATE 40 MG/ML IJ SUSP
INTRAMUSCULAR | Status: AC
  Filled 2020-01-11: qty 1

## 2020-01-11 MED ORDER — FENTANYL CITRATE (PF) 100 MCG/2ML IJ SOLN
INTRAMUSCULAR | Status: DC | PRN
  Administered 2020-01-11: 50 ug via INTRAVENOUS

## 2020-01-11 MED ORDER — ACETAMINOPHEN 650 MG RE SUPP
650.0000 mg | RECTAL | Status: DC | PRN
  Filled 2020-01-11: qty 1

## 2020-01-11 MED ORDER — CHLORHEXIDINE GLUCONATE 0.12 % MT SOLN: 15.0000 mL | Freq: Once | OROMUCOSAL | Status: DC

## 2020-01-11 MED ORDER — MIDAZOLAM HCL 2 MG/2ML IJ SOLN
INTRAMUSCULAR | Status: DC | PRN
  Administered 2020-01-11: 2 mg via INTRAVENOUS

## 2020-01-11 MED ORDER — SODIUM CHLORIDE 0.9% FLUSH: 3.0000 mL | INTRAVENOUS | Status: DC | PRN

## 2020-01-11 MED ORDER — METHOCARBAMOL 500 MG PO TABS
500.0000 mg | ORAL_TABLET | Freq: Four times a day (QID) | ORAL | Status: DC | PRN
  Administered 2020-01-11: 500 mg via ORAL

## 2020-01-11 MED ORDER — BUPIVACAINE HCL 0.5 % IJ SOLN
INTRAMUSCULAR | Status: DC | PRN
  Administered 2020-01-11: 20 mL

## 2020-01-11 MED ORDER — SUCCINYLCHOLINE CHLORIDE 200 MG/10ML IV SOSY
PREFILLED_SYRINGE | INTRAVENOUS | Status: AC
  Filled 2020-01-11: qty 10

## 2020-01-11 MED ORDER — CEFAZOLIN SODIUM-DEXTROSE 2-4 GM/100ML-% IV SOLN
INTRAVENOUS | Status: AC
  Filled 2020-01-11: qty 100

## 2020-01-11 MED ORDER — SODIUM CHLORIDE FLUSH 0.9 % IV SOLN
INTRAVENOUS | Status: AC
  Filled 2020-01-11: qty 30

## 2020-01-11 MED ORDER — OXYCODONE HCL 5 MG PO TABS
10.0000 mg | ORAL_TABLET | ORAL | Status: DC | PRN
  Administered 2020-01-11: 5 mg via ORAL

## 2020-01-11 MED ORDER — OXYCODONE HCL 5 MG PO TABS: 5.0000 mg | ORAL_TABLET | ORAL | 0 refills | Status: AC | PRN

## 2020-01-11 MED ORDER — CEFAZOLIN SODIUM-DEXTROSE 2-4 GM/100ML-% IV SOLN: 2.0000 g | Freq: Three times a day (TID) | INTRAVENOUS | Status: DC

## 2020-01-11 MED ORDER — FLEET ENEMA 7-19 GM/118ML RE ENEM: 1.0000 | ENEMA | Freq: Once | RECTAL | Status: DC | PRN

## 2020-01-11 MED ORDER — ACETAMINOPHEN 10 MG/ML IV SOLN
INTRAVENOUS | Status: AC
  Filled 2020-01-11: qty 100

## 2020-01-11 MED ORDER — METHYLPREDNISOLONE ACETATE 40 MG/ML IJ SUSP
INTRAMUSCULAR | Status: DC | PRN
  Administered 2020-01-11: 40 mg

## 2020-01-11 MED ORDER — FAMOTIDINE 20 MG PO TABS: 20.0000 mg | ORAL_TABLET | Freq: Once | ORAL | Status: AC

## 2020-01-11 MED ORDER — METHOCARBAMOL 500 MG PO TABS
ORAL_TABLET | ORAL | Status: AC
  Filled 2020-01-11: qty 1

## 2020-01-11 MED ORDER — REMIFENTANIL HCL 1 MG IV SOLR
INTRAVENOUS | Status: AC
  Filled 2020-01-11: qty 1000

## 2020-01-11 MED ORDER — BISACODYL 10 MG RE SUPP
10.0000 mg | Freq: Every day | RECTAL | Status: DC | PRN
  Filled 2020-01-11: qty 1

## 2020-01-11 MED ORDER — SODIUM CHLORIDE 0.9 % IV SOLN
INTRAVENOUS | Status: DC | PRN
  Administered 2020-01-11: 20 ug/min via INTRAVENOUS

## 2020-01-11 MED ORDER — PHENOL 1.4 % MT LIQD
1.0000 | OROMUCOSAL | Status: DC | PRN
  Filled 2020-01-11: qty 177

## 2020-01-11 MED ORDER — HYDROMORPHONE HCL 1 MG/ML IJ SOLN: 0.5000 mg | INTRAMUSCULAR | Status: DC | PRN

## 2020-01-11 MED ORDER — MENTHOL 3 MG MT LOZG
1.0000 | LOZENGE | OROMUCOSAL | Status: DC | PRN
  Filled 2020-01-11: qty 9

## 2020-01-11 MED ORDER — SUCCINYLCHOLINE CHLORIDE 20 MG/ML IJ SOLN
INTRAMUSCULAR | Status: DC | PRN
  Administered 2020-01-11: 120 mg via INTRAVENOUS

## 2020-01-11 MED ORDER — BACITRACIN 50000 UNITS IM SOLR
INTRAMUSCULAR | Status: AC
  Filled 2020-01-11: qty 1

## 2020-01-11 MED ORDER — ACETAMINOPHEN 10 MG/ML IV SOLN
INTRAVENOUS | Status: DC | PRN
  Administered 2020-01-11: 1000 mg via INTRAVENOUS

## 2020-01-11 MED ORDER — SODIUM CHLORIDE 0.9 % IV SOLN
INTRAVENOUS | Status: DC | PRN
  Administered 2020-01-11: 40 mL

## 2020-01-11 MED ORDER — DEXAMETHASONE SODIUM PHOSPHATE 10 MG/ML IJ SOLN
INTRAMUSCULAR | Status: DC | PRN
  Administered 2020-01-11: 10 mg via INTRAVENOUS

## 2020-01-11 MED ORDER — FENTANYL CITRATE (PF) 100 MCG/2ML IJ SOLN
INTRAMUSCULAR | Status: AC
  Filled 2020-01-11: qty 2

## 2020-01-11 MED ORDER — OXYCODONE HCL 5 MG PO TABS
ORAL_TABLET | ORAL | Status: AC
  Filled 2020-01-11: qty 1

## 2020-01-11 MED ORDER — ONDANSETRON HCL 4 MG/2ML IJ SOLN
INTRAMUSCULAR | Status: DC | PRN
  Administered 2020-01-11: 4 mg via INTRAVENOUS

## 2020-01-11 MED ORDER — MIDAZOLAM HCL 2 MG/2ML IJ SOLN
INTRAMUSCULAR | Status: AC
  Filled 2020-01-11: qty 2

## 2020-01-11 MED ORDER — CEFAZOLIN SODIUM-DEXTROSE 2-4 GM/100ML-% IV SOLN
2.0000 g | INTRAVENOUS | Status: AC
  Administered 2020-01-11: 2 g via INTRAVENOUS

## 2020-01-11 MED ORDER — BUPIVACAINE-EPINEPHRINE (PF) 0.5% -1:200000 IJ SOLN
INTRAMUSCULAR | Status: AC
  Filled 2020-01-11: qty 30

## 2020-01-11 MED ORDER — SODIUM CHLORIDE 0.9% FLUSH: 3.0000 mL | Freq: Two times a day (BID) | INTRAVENOUS | Status: DC

## 2020-01-11 MED ORDER — FAMOTIDINE 20 MG PO TABS
ORAL_TABLET | ORAL | Status: AC
  Administered 2020-01-11: 20 mg via ORAL
  Filled 2020-01-11: qty 1

## 2020-01-11 MED ORDER — HYDROMORPHONE HCL 1 MG/ML IJ SOLN
INTRAMUSCULAR | Status: AC
  Administered 2020-01-11: 0.5 mg via INTRAVENOUS
  Filled 2020-01-11: qty 1

## 2020-01-11 MED ORDER — BUPIVACAINE LIPOSOME 1.3 % IJ SUSP
INTRAMUSCULAR | Status: AC
  Filled 2020-01-11: qty 20

## 2020-01-11 MED ORDER — DEXMEDETOMIDINE HCL IN NACL 80 MCG/20ML IV SOLN
INTRAVENOUS | Status: AC
  Filled 2020-01-11: qty 20

## 2020-01-11 MED ORDER — THROMBIN 5000 UNITS EX SOLR
CUTANEOUS | Status: AC
  Filled 2020-01-11: qty 5000

## 2020-01-11 MED ORDER — OXYCODONE HCL 5 MG PO TABS
5.0000 mg | ORAL_TABLET | ORAL | Status: DC | PRN
  Administered 2020-01-11: 5 mg via ORAL

## 2020-01-11 MED ORDER — SERTRALINE HCL 50 MG PO TABS: 75.0000 mg | ORAL_TABLET | Freq: Every day | ORAL | Status: DC

## 2020-01-11 MED ORDER — REMIFENTANIL HCL 2 MG IV SOLR
INTRAVENOUS | Status: DC | PRN
  Administered 2020-01-11: .1 ug/kg/min via INTRAVENOUS

## 2020-01-11 MED ORDER — PROPOFOL 10 MG/ML IV BOLUS
INTRAVENOUS | Status: AC
  Filled 2020-01-11: qty 20

## 2020-01-11 MED ORDER — OXYCODONE HCL 5 MG PO TABS: 5.0000 mg | ORAL_TABLET | ORAL | 0 refills | Status: DC | PRN

## 2020-01-11 MED ORDER — LIDOCAINE HCL (CARDIAC) PF 100 MG/5ML IV SOSY
PREFILLED_SYRINGE | INTRAVENOUS | Status: DC | PRN
  Administered 2020-01-11: 100 mg via INTRAVENOUS

## 2020-01-11 MED ORDER — BUPIVACAINE HCL (PF) 0.5 % IJ SOLN
INTRAMUSCULAR | Status: AC
  Filled 2020-01-11: qty 30

## 2020-01-11 MED ORDER — FENTANYL CITRATE (PF) 100 MCG/2ML IJ SOLN
25.0000 ug | INTRAMUSCULAR | Status: DC | PRN
  Administered 2020-01-11 (×3): 25 ug via INTRAVENOUS

## 2020-01-11 MED ORDER — METHOCARBAMOL 1000 MG/10ML IJ SOLN
500.0000 mg | Freq: Four times a day (QID) | INTRAVENOUS | Status: DC | PRN
  Filled 2020-01-11: qty 5

## 2020-01-11 SURGICAL SUPPLY — 56 items
BUR NEURO DRILL SOFT 3.0X3.8M (BURR) ×2 IMPLANT
CANISTER SUCT 1200ML W/VALVE (MISCELLANEOUS) ×4 IMPLANT
CHLORAPREP W/TINT 26 (MISCELLANEOUS) ×4 IMPLANT
CNTNR SPEC 2.5X3XGRAD LEK (MISCELLANEOUS) ×1
CONT SPEC 4OZ STER OR WHT (MISCELLANEOUS) ×1
CONTAINER SPEC 2.5X3XGRAD LEK (MISCELLANEOUS) ×1 IMPLANT
COUNTER NEEDLE 20/40 LG (NEEDLE) ×2 IMPLANT
COVER LIGHT HANDLE STERIS (MISCELLANEOUS) ×4 IMPLANT
COVER WAND RF STERILE (DRAPES) ×2 IMPLANT
CUP MEDICINE 2OZ PLAST GRAD ST (MISCELLANEOUS) ×4 IMPLANT
DERMABOND ADVANCED (GAUZE/BANDAGES/DRESSINGS) ×1
DERMABOND ADVANCED .7 DNX12 (GAUZE/BANDAGES/DRESSINGS) ×1 IMPLANT
DRAPE C-ARM 42X72 X-RAY (DRAPES) ×4 IMPLANT
DRAPE LAPAROTOMY 100X77 ABD (DRAPES) ×2 IMPLANT
DRAPE MICROSCOPE SPINE 48X150 (DRAPES) ×2 IMPLANT
DRAPE SURG 17X11 SM STRL (DRAPES) ×8 IMPLANT
ELECT CAUTERY BLADE TIP 2.5 (TIP) ×2
ELECT EZSTD 165MM 6.5IN (MISCELLANEOUS)
ELECT REM PT RETURN 9FT ADLT (ELECTROSURGICAL) ×2
ELECTRODE CAUTERY BLDE TIP 2.5 (TIP) ×1 IMPLANT
ELECTRODE EZSTD 165MM 6.5IN (MISCELLANEOUS) IMPLANT
ELECTRODE REM PT RTRN 9FT ADLT (ELECTROSURGICAL) ×1 IMPLANT
FRAME EYE SHIELD (PROTECTIVE WEAR) ×4 IMPLANT
GLOVE BIOGEL PI IND STRL 7.0 (GLOVE) ×1 IMPLANT
GLOVE BIOGEL PI INDICATOR 7.0 (GLOVE) ×1
GLOVE SURG SYN 7.0 (GLOVE) ×4 IMPLANT
GLOVE SURG SYN 8.5  E (GLOVE) ×3
GLOVE SURG SYN 8.5 E (GLOVE) ×3 IMPLANT
GOWN SRG XL LVL 3 NONREINFORCE (GOWNS) ×1 IMPLANT
GOWN STRL NON-REIN TWL XL LVL3 (GOWNS) ×1
GOWN STRL REUS W/ TWL XL LVL3 (GOWN DISPOSABLE) ×1 IMPLANT
GOWN STRL REUS W/TWL XL LVL3 (GOWN DISPOSABLE) ×1
GRADUATE 1200CC STRL 31836 (MISCELLANEOUS) ×2 IMPLANT
KIT SPINAL PRONEVIEW (KITS) ×2 IMPLANT
KNIFE BAYONET SHORT DISCETOMY (MISCELLANEOUS) IMPLANT
MARKER SKIN DUAL TIP RULER LAB (MISCELLANEOUS) ×2 IMPLANT
NDL SAFETY ECLIPSE 18X1.5 (NEEDLE) ×1 IMPLANT
NEEDLE HYPO 18GX1.5 SHARP (NEEDLE) ×1
NEEDLE HYPO 22GX1.5 SAFETY (NEEDLE) ×2 IMPLANT
NS IRRIG 1000ML POUR BTL (IV SOLUTION) ×2 IMPLANT
PACK LAMINECTOMY NEURO (CUSTOM PROCEDURE TRAY) ×2 IMPLANT
PAD ARMBOARD 7.5X6 YLW CONV (MISCELLANEOUS) ×2 IMPLANT
SPOGE SURGIFLO 8M (HEMOSTASIS) ×1
SPONGE SURGIFLO 8M (HEMOSTASIS) ×1 IMPLANT
SUT DVC VLOC 3-0 CL 6 P-12 (SUTURE) ×2 IMPLANT
SUT VIC AB 0 CT1 27 (SUTURE) ×1
SUT VIC AB 0 CT1 27XCR 8 STRN (SUTURE) ×1 IMPLANT
SUT VIC AB 2-0 CT1 18 (SUTURE) ×2 IMPLANT
SYR 10ML LL (SYRINGE) ×2 IMPLANT
SYR 20ML LL LF (SYRINGE) ×2 IMPLANT
SYR 30ML LL (SYRINGE) ×4 IMPLANT
SYR 3ML LL SCALE MARK (SYRINGE) ×2 IMPLANT
TOWEL OR 17X26 4PK STRL BLUE (TOWEL DISPOSABLE) ×6 IMPLANT
TUBE MATRX SPINL 18MM 7CM DISP (INSTRUMENTS) ×1
TUBE METRX SPINAL 18X7 DISP (INSTRUMENTS) ×1 IMPLANT
TUBING CONNECTING 10 (TUBING) ×2 IMPLANT

## 2020-01-11 NOTE — Transfer of Care (Signed)
Immediate Anesthesia Transfer of Care Note  Patient: Adriana Lewis  Procedure(s) Performed: LEFT L4-5 MICRODISCECTOMY (Left )  Patient Location: PACU  Anesthesia Type:General  Level of Consciousness: sedated  Airway & Oxygen Therapy: Patient connected to face mask oxygen  Post-op Assessment: Post -op Vital signs reviewed and stable  Post vital signs: stable  Last Vitals:  Vitals Value Taken Time  BP 143/99   Temp    Pulse 103 01/11/20 1415  Resp 20   SpO2 100 % 01/11/20 1415  Vitals shown include unvalidated device data.  Last Pain:  Vitals:   01/11/20 0948  TempSrc: Temporal  PainSc: 0-No pain         Complications: No apparent anesthesia complications

## 2020-01-11 NOTE — Anesthesia Preprocedure Evaluation (Signed)
Anesthesia Evaluation  Patient identified by MRN, date of birth, ID band Patient awake    Reviewed: Allergy & Precautions, NPO status , Patient's Chart, lab work & pertinent test results  History of Anesthesia Complications Negative for: history of anesthetic complications  Airway Mallampati: II  TM Distance: >3 FB Neck ROM: Full    Dental no notable dental hx.    Pulmonary neg pulmonary ROS, neg sleep apnea, neg COPD,    breath sounds clear to auscultation- rhonchi (-) wheezing      Cardiovascular Exercise Tolerance: Good (-) hypertension(-) CAD and (-) Past MI negative cardio ROS   Rhythm:Regular Rate:Normal - Systolic murmurs and - Diastolic murmurs    Neuro/Psych negative neurological ROS  negative psych ROS   GI/Hepatic negative GI ROS, Neg liver ROS,   Endo/Other  negative endocrine ROSneg diabetes  Renal/GU negative Renal ROS  negative genitourinary   Musculoskeletal  (+) Arthritis , Osteoarthritis,    Abdominal (+) + obese,   Peds negative pediatric ROS (+)  Hematology negative hematology ROS (+)   Anesthesia Other Findings   Reproductive/Obstetrics                             Anesthesia Physical  Anesthesia Plan  ASA: II  Anesthesia Plan: General   Post-op Pain Management:    Induction: Intravenous  PONV Risk Score and Plan: 2 and Ondansetron and Dexamethasone  Airway Management Planned: Oral ETT  Additional Equipment:   Intra-op Plan:   Post-operative Plan: Extubation in OR  Informed Consent: I have reviewed the patients History and Physical, chart, labs and discussed the procedure including the risks, benefits and alternatives for the proposed anesthesia with the patient or authorized representative who has indicated his/her understanding and acceptance.     Dental advisory given  Plan Discussed with: CRNA and Anesthesiologist  Anesthesia Plan Comments:          Anesthesia Quick Evaluation

## 2020-01-11 NOTE — Discharge Summary (Signed)
Procedure: left L4/5 microdiscectomy Procedure date: 01/12/2020 Diagnosis: Lumbar radiculopathy   History: Adriana Lewis is s/p left L4/5 microdiscectomy POD0: [name] tolerated the procedure well. She is evaluated in the post op area and has ambulated, urinated without difficulty recently, and has tolerated PO. Her pain is well controlled and she is eager to go home.  Physical Exam: Vitals:   01/11/20 1600 01/11/20 1734  BP: (!) 165/90 133/80  Pulse: (!) 107 (!) 115  Resp: 16 18  Temp: 98.9 F (37.2 C)   SpO2: 95% 98%    General: Alert and oriented, sitting in bed Strength:5/5 throughout  Sensation: intact and symmetric throughout  Skin: incision to back c/d/I.    Data:  No results for input(s): NA, K, CL, CO2, BUN, CREATININE, LABGLOM, GLUCOSE, CALCIUM in the last 168 hours. No results for input(s): AST, ALT, ALKPHOS in the last 168 hours.  Invalid input(s): TBILI   No results for input(s): WBC, HGB, HCT, PLT in the last 168 hours. Recent Labs  Lab 01/11/20 5331  APTT 29           Assessment/Plan:  [name] is POD0 s/p  left L4/5 microdiscectomy. She has met all criteria for discharge and is eager to return home.   She feels that her left leg is much stronger than prior to surgery.  She was given post op discharge instructions.  She will f/u in 2 weeks in our clinic.  Lonell Face, NP Department of Neurosurgery

## 2020-01-11 NOTE — Op Note (Signed)
Indications: Adriana Lewis is a 24 yo female who presented with lumbar radiculopathy with over weakness.  Due to worsening symptoms and weakness, surgery was recommended.  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  Preoperative Diagnosis: Lumbar radiculopathy Postoperative Diagnosis: same   EBL: 25 ml IVF: 700 ml Drains: none Disposition: Extubated and Stable to PACU Complications: none  No foley catheter was placed.   Preoperative Note:   Risks of surgery discussed include: infection, bleeding, stroke, coma, death, paralysis, CSF leak, nerve/spinal cord injury, numbness, tingling, weakness, complex regional pain syndrome, recurrent stenosis and/or disc herniation, vascular injury, development of instability, neck/back pain, need for further surgery, persistent symptoms, development of deformity, and the risks of anesthesia. The patient understood these risks and agreed to proceed.  Operative Note:   1) left L4/5 microdiscectomy  The patient was then brought from the preoperative center with intravenous access established.  The patient underwent general anesthesia and endotracheal tube intubation, and was then rotated on the [place] rail top where all pressure points were appropriately padded.  The skin was then thoroughly cleansed.  Perioperative antibiotic prophylaxis was administered.  Sterile prep and drapes were then applied and a timeout was then observed.  C-arm was brought into the field under sterile conditions, and the L4-5 disc space identified and marked with an incision on the left 1cm lateral to midline.  Once this was complete a 2 cm incision was opened with the use of a #10 blade knife.  The Metrx tubes were sequentially advanced under lateral fluoroscopy until a 18 x 70 mm Metrx tube was placed over the facet and lamina and secured to the bed.    The microscope was then sterilely brought into the field and muscle creep was hemostased with a bipolar and resected with a  pituitary rongeur.  A Bovie extender was then used to expose the spinous process and lamina.  Careful attention was placed to not violate the facet capsule. A 3 mm matchstick drill bit was then used to make a hemi-laminotomy trough until the ligamentum flavum was exposed.  This was extended to the base of the spinous process.  Once this was complete and the underlying ligamentum flavum was visualized this was dissected with an up angle curette and resected with a #2 and #3 mm biting Kerrison.  The laminotomy opening was also expanded in similar fashion and hemostasis was obtained with Surgifoam and a patty as well as bone wax.  The rostral aspect of the caudal level of the lamina was also resected with a #2 biting Kerrison effort to further enhance exposure.  Once the underlying dura was visualized a Penfield 4 was then used to dissect and expose the traversing nerve root.  Once this was identified a nerve root retractor suction was used to mobilize this medially.  The venous plexus was hemostased with Surgifoam and light bipolar use.  A small penfield was used to make a small annulotomy within the disc space and disc space contents were noted to come through the annulus.    The disc herniation was identified and dissected free using a balltip probe. The pituitary rongeur was used to remove the extruded disc fragments. Once the thecal sac and nerve root were noted to be relaxed and under less tension the ball-tipped feeler was passed along the foramen distally to to ensure no residual compression was noted.    Depo-Medrol was placed along the nerve root.  The area was irrigated. The tube system was then removed under microscopic visualization  and hemostasis was obtained with a bipolar.    The fascial layer was reapproximated with the use of a 0- Vicryl suture.  Subcutaneous tissue layer was reapproximated using 2-0 Vicryl suture.  3-0 monocryl was used on the skin. The skin was then cleansed and Dermabond was  used to close the skin opening.  Patient was then rotated back to the preoperative bed awakened from anesthesia and taken to recovery all counts are correct in this case.   I performed the entire procedure with the assistance of Lonell Face NP as an Pensions consultant.  Meade Maw MD

## 2020-01-11 NOTE — Anesthesia Procedure Notes (Signed)
Procedure Name: Intubation Date/Time: 01/11/2020 12:43 PM Performed by: Irving Burton, CRNA Pre-anesthesia Checklist: Patient identified, Emergency Drugs available, Suction available and Patient being monitored Patient Re-evaluated:Patient Re-evaluated prior to induction Oxygen Delivery Method: Circle system utilized Preoxygenation: Pre-oxygenation with 100% oxygen Induction Type: IV induction Ventilation: Mask ventilation without difficulty Laryngoscope Size: McGraph and 3 Grade View: Grade I Tube type: Oral Tube size: 7.0 mm Number of attempts: 1 Airway Equipment and Method: Stylet Placement Confirmation: ETT inserted through vocal cords under direct vision,  positive ETCO2 and breath sounds checked- equal and bilateral Secured at: 21 cm Tube secured with: Tape Dental Injury: Teeth and Oropharynx as per pre-operative assessment

## 2020-01-11 NOTE — Progress Notes (Signed)
Arrived  Via stretcher from PACU awake and Ox3, VSS ambulated to bedside commode unable to to void. Scanned bladder 1075 ml urine in bladder. Pt wanted to go to BR to void, able to void 900 ml plus more urine in toilet, clear yellow urine. Pt c/o minimal back pain.

## 2020-01-11 NOTE — Anesthesia Postprocedure Evaluation (Signed)
Anesthesia Post Note  Patient: Adriana Lewis  Procedure(s) Performed: LEFT L4-5 MICRODISCECTOMY (Left )  Patient location during evaluation: PACU Anesthesia Type: General Level of consciousness: awake and alert and oriented Pain management: pain level controlled Vital Signs Assessment: post-procedure vital signs reviewed and stable Respiratory status: spontaneous breathing Cardiovascular status: blood pressure returned to baseline Anesthetic complications: no     Last Vitals:  Vitals:   01/11/20 1530 01/11/20 1600  BP: 125/74 (!) 165/90  Pulse: (!) 103 (!) 107  Resp: 12 16  Temp: 37.2 C 37.2 C  SpO2: 94% 95%    Last Pain:  Vitals:   01/11/20 1600  TempSrc:   PainSc: 2                  Jayvon Mounger

## 2020-01-11 NOTE — H&P (Signed)
I have reviewed and confirmed my history and physical from 01/05/2020 with no additions or changes. Plan for L4-5 microdiscectomy on the left.  Risks and benefits reviewed.  Heart sounds normal no MRG. Chest Clear to Auscultation Bilaterally.

## 2020-01-11 NOTE — Discharge Instructions (Signed)
NEUROSURGERY DISCHARGE INSTRUCTIONS  The following are instructions to help in your recovery once you have been discharged from the hospital. Even if you feel well, it is important that you follow these activity guidelines.  What to do after you leave the hospital:  Recommended diet:  Increase protein intake to promote wound healing. You may return to your usual diet. However, you may experience discomfort when swallowing in the first month after your surgery. This is normal. You may find that softer foods are more comfortable for you to swallow. Be sure to stay hydrated.   Recommended activity: No bending, lifting, or twisting ("BLT"). Avoid lifting objects heavier than 10 pounds (gallon milk jug). Where possible, avoid household activities that involve lifting, bending, reaching, pushing, or pulling such as laundry, vacuuming, grocery shopping, and childcare. Try to arrange for help from friends and family for these activities while you heal.   Increase physical activity slowly as tolerated. Taking short walks is encouraged, but avoid strenuous exercise. Do not jog, run, bicycle, lift weights, or participate in any other exercises unless specifically allowed by your doctor.   You should not drive until cleared by your doctor.   Until released by your doctor, you should not return to work or school. You should rest at home and let your body heal.   You may shower the day after your surgery. After showering, lightly dab your incision dry. Do not take a tub bath or go swimming until approved by your doctor at your follow-up appointment.   If you smoke, we strongly recommend that you quit. Smoking has been proven to interfere with normal bone healing and will dramatically reduce the success rate of your surgery. Please contact [facility] (800-QUIT-NOW) and use the resources at www.[facility].com for assistance in stopping smoking.   Medications  Do not restart Aspirin until seven days after  surgery  * Do not take anti-inflammatory medications for 3 days after surgery (naproxen [Aleve], ibuprofen [Advil, Motrin], celecoxib [Celebrex], etc.).   You may restart home medications.   Wound Care Instructions  Keep your incision area clean and dry. You may shower POD3 (Saturday). If you have staples or stitches on your incision, you should have a follow up scheduled for removal. If you do not have staples or stitches, you will have steri-strips (small pieces of surgical tape) or Dermabond glue. The steri-strips/glue should begin to peel away within about a week (it is fine if the steri-strips fall off before then). If the strips are still in place one week after your surgery, you may gently remove them.    Please Report any of the following: Should you experience any of the following, contact us immediately:   New numbness or weakness   Pain that is progressively getting worse, and is not relieved by your pain medication, muscle relaxers, rest, and warm compresses   Bleeding, redness, swelling, pain, or drainage from surgical incision   Chills or flu-like symptoms   Fever greater than 101.0 F (38.3 C)   Inability to eat, drink fluids, or take medications   Problems with bowel or bladder functions   Difficulty breathing or shortness of breath   Warmth, tenderness, or swelling in your calf    Additional Follow up appointments During office hours (Monday-Friday 9 am to 5 pm), please call your physician at [phone] and ask for Berdine Addison.   After hours and weekends, please call [phone] and an answering service will put you in touch with either Dr. Lacinda Axon or Dr.  Myer Haff.   For a life-threatening emergency, call 911

## 2020-08-27 ENCOUNTER — Other Ambulatory Visit: Payer: Self-pay

## 2020-08-27 ENCOUNTER — Encounter: Payer: Self-pay | Admitting: Student in an Organized Health Care Education/Training Program

## 2020-08-27 ENCOUNTER — Ambulatory Visit
Payer: BC Managed Care – PPO | Attending: Student in an Organized Health Care Education/Training Program | Admitting: Student in an Organized Health Care Education/Training Program

## 2020-08-27 VITALS — BP 138/73 | HR 93 | Temp 97.1°F | Resp 16 | Ht 68.0 in | Wt 240.0 lb

## 2020-08-27 DIAGNOSIS — Z9889 Other specified postprocedural states: Secondary | ICD-10-CM | POA: Diagnosis not present

## 2020-08-27 DIAGNOSIS — M5416 Radiculopathy, lumbar region: Secondary | ICD-10-CM

## 2020-08-27 NOTE — Progress Notes (Signed)
Patient: Adriana Lewis  Service Category: E/M  Provider: Gillis Santa, MD  DOB: 1995-10-14  DOS: 08/27/2020  Referring Provider: Meade Maw, MD  MRN: [id number]  Setting: Ambulatory outpatient  PCP: Patient, No Pcp Per  Type: New Patient  Specialty: Interventional Pain Management    Location: Office  Delivery: Face-to-face     Primary Reason(s) for Visit: Encounter for initial evaluation of one or more chronic problems (new to examiner) potentially causing chronic pain, and posing a threat to normal musculoskeletal function. (Level of risk: High) CC: Back Pain (Lumbar right )  HPI  Adriana Lewis is a 25 y.o. year old, female patient, who comes for the first time to our practice referred by Meade Maw, MD for our initial evaluation of her chronic pain. She has Abdominal cramping; Chondromalacia patellae; Ganglion and cyst of synovium, tendon, and bursa; Uterine contractions during pregnancy; Term pregnancy; SVD (spontaneous vaginal delivery); Second-degree perineal laceration, with delivery; Postpartum care following vaginal delivery (11/4); Lumbar radicular pain; and Status post lumbar spine surgery for decompression of spinal cord (Left L4/5: 01/11/20) on their problem list. Today she comes in for evaluation of her back pain and left leg pain. Pain Assessment: Location: Lower,Left Back Radiating: going down the left leg Onset: More than a month ago Duration: Chronic pain Quality: Discomfort,Constant (incapacitating.) Severity: 4 /10 (subjective, self-reported pain score)  Effect on ADL: will not go anywhere alone d/t not knowing when legs will give out. Timing: Constant Modifying factors: the only thing that eases a tiny bit is lying down with legs elevated. BP: 138/73  HR: 93  Onset and Duration: Sudden and Present longer than 3 months Cause of pain: Unknown Severity: NAS-11 at its worse: 10/10, NAS-11 at its best: 2/10, NAS-11 now: 4/10 and NAS-11 on the average:  4/10 Timing: Not influenced by the time of the day Aggravating Factors: Bending, Climbing, Eating, Intercourse (sex), Kneeling, Lifiting, Motion, Prolonged sitting, Prolonged standing, Squatting, Stooping , Twisting and Walking Alleviating Factors: Lying down Associated Problems: Numbness and Spasms Quality of Pain: Agonizing and Disabling Previous Examinations or Tests: MRI scan Previous Treatments: Steroid treatments by mouth  Adriana Lewis presents today with a chief complaint of low back pain with radiation into her left lower extremity in a dermatomal fashion.  She has been referred here as a fast-track for consideration of lumbar epidural steroid injection.  Of note patient has a L4-L5 lumbar laminectomy decompression with microdiscectomy performed on the left on 01/11/2020.  She is currently pregnant.  She is having an increase in her lumbar radicular pain flare on the right side.  She has been referred here from Dr. Cari Caraway with neurosurgery to consider epidural steroid injection.  Meds   Current Outpatient Medications:  .  acetaminophen (TYLENOL) 325 MG tablet, Take 2 tablets (650 mg total) by mouth every 4 (four) hours as needed for mild pain ((score 1 to 3) or temp > 100.5)., Disp:  , Rfl:  .  polyethylene glycol (MIRALAX / GLYCOLAX) 17 g packet, Take 17 g by mouth daily as needed for mild constipation., Disp: 14 each, Rfl: 0 .  Prenatal Vit-Fe Fumarate-FA (PNV PRENATAL PLUS MULTIVITAMIN) 27-1 MG TABS, Take 1 tablet by mouth daily., Disp: , Rfl:  .  sertraline (ZOLOFT) 25 MG tablet, Take 75 mg by mouth daily. , Disp: , Rfl:  .  senna (SENOKOT) 8.6 MG TABS tablet, Take 1 tablet (8.6 mg total) by mouth 2 (two) times daily. (Patient not taking: Reported on 08/27/2020), Disp: 120 tablet, Rfl:  0  Imaging Review  DG Lumbar Spine 2-3 Views  Narrative CLINICAL DATA:  L4-5 discectomy  EXAM: LUMBAR SPINE - 2-3 VIEW  COMPARISON:  None.  FLUOROSCOPY TIME:  Radiation Exposure Index (as  provided by the fluoroscopic device): 5.80 mGy  If the device does not provide the exposure index:  Fluoroscopy Time:  6 seconds  Number of Acquired Images:  4  FINDINGS: Initial images demonstrate localization at L4-5 at the skin surface. Subsequent surgical instruments are seen at the L4-5 level  IMPRESSION: Intraoperative localization at L4-5.   Electronically Signed By: Inez Catalina M.D. On: 01/11/2020 13:51   Complexity Note: Imaging results reviewed. Results shared with Adriana Lewis, using Layman's terms.                         ROS  Cardiovascular: Abnormal heart rhythm Pulmonary or Respiratory: No reported pulmonary signs or symptoms such as wheezing and difficulty taking a deep full breath (Asthma), difficulty blowing air out (Emphysema), coughing up mucus (Bronchitis), persistent dry cough, or temporary stoppage of breathing during sleep Neurological: No reported neurological signs or symptoms such as seizures, abnormal skin sensations, urinary and/or fecal incontinence, being born with an abnormal open spine and/or a tethered spinal cord Psychological-Psychiatric: Anxiousness Gastrointestinal: No reported gastrointestinal signs or symptoms such as vomiting or evacuating blood, reflux, heartburn, alternating episodes of diarrhea and constipation, inflamed or scarred liver, or pancreas or irrregular and/or infrequent bowel movements Genitourinary: No reported renal or genitourinary signs or symptoms such as difficulty voiding or producing urine, peeing blood, non-functioning kidney, kidney stones, difficulty emptying the bladder, difficulty controlling the flow of urine, or chronic kidney disease Hematological: No reported hematological signs or symptoms such as prolonged bleeding, low or poor functioning platelets, bruising or bleeding easily, hereditary bleeding problems, low energy levels due to low hemoglobin or being anemic Endocrine: No reported endocrine signs or  symptoms such as high or low blood sugar, rapid heart rate due to high thyroid levels, obesity or weight gain due to slow thyroid or thyroid disease Rheumatologic: No reported rheumatological signs and symptoms such as fatigue, joint pain, tenderness, swelling, redness, heat, stiffness, decreased range of motion, with or without associated rash Musculoskeletal: Negative for myasthenia gravis, muscular dystrophy, multiple sclerosis or malignant hyperthermia Work History: Working full time  Allergies  Adriana Lewis is allergic to latex and sulfa antibiotics.  Laboratory Chemistry Profile   Renal Lab Results  Component Value Date   SPECGRAV 1.010 01/23/2017   PHUR 6.5 01/23/2017   PROTEINUR NEGATIVE 05/11/2018     Electrolytes No results found for: NA, K, CL, CALCIUM, MG, PHOS   Hepatic No results found for: AST, ALT, ALBUMIN, ALKPHOS, AMYLASE, LIPASE, AMMONIA   ID Lab Results  Component Value Date   HIV Non-reactive 11/23/2017   SARSCOV2NAA NEGATIVE 01/10/2020   STAPHAUREUS NEGATIVE 01/10/2020   MRSAPCR NEGATIVE 01/10/2020   PREGTESTUR NEGATIVE 01/11/2020     Bone Lab Results  Component Value Date   TESTOSTERONE 55 (H) 11/19/2016     Endocrine Lab Results  Component Value Date   GLUCOSEU NEGATIVE 05/11/2018   HGBA1C 4.9 01/09/2017   TSH 1.540 01/09/2017   TESTOSTERONE 55 (H) 11/19/2016     Neuropathy Lab Results  Component Value Date   HGBA1C 4.9 01/09/2017   HIV Non-reactive 11/23/2017     CNS No results found for: COLORCSF, APPEARCSF, RBCCOUNTCSF, WBCCSF, POLYSCSF, LYMPHSCSF, EOSCSF, PROTEINCSF, GLUCCSF, JCVIRUS, CSFOLI, IGGCSF, LABACHR, ACETBL, LABACHR, ACETBL  Inflammation (CRP: Acute  ESR: Chronic) No results found for: CRP, ESRSEDRATE, LATICACIDVEN   Rheumatology No results found for: RF, ANA, LABURIC, URICUR, LYMEIGGIGMAB, LYMEABIGMQN, HLAB27   Coagulation Lab Results  Component Value Date   APTT 29 01/11/2020   PLT 173 06/22/2018      Cardiovascular Lab Results  Component Value Date   HGB 9.0 (L) 06/22/2018   HCT 26.5 (L) 06/22/2018     Screening Lab Results  Component Value Date   SARSCOV2NAA NEGATIVE 01/10/2020   STAPHAUREUS NEGATIVE 01/10/2020   MRSAPCR NEGATIVE 01/10/2020   HIV Non-reactive 11/23/2017   PREGTESTUR NEGATIVE 01/11/2020     Cancer No results found for: CEA, CA125, LABCA2   Allergens No results found for: ALMOND, APPLE, ASPARAGUS, AVOCADO, BANANA, BARLEY, BASIL, BAYLEAF, GREENBEAN, LIMABEAN, WHITEBEAN, BEEFIGE, REDBEET, BLUEBERRY, BROCCOLI, CABBAGE, MELON, CARROT, CASEIN, CASHEWNUT, CAULIFLOWER, CELERY     Note: Lab results reviewed.  PFSH  Drug: Adriana Lewis  reports no history of drug use. Alcohol:  reports no history of alcohol use. Tobacco:  reports that she has never smoked. She has never used smokeless tobacco. Medical:  has a past medical history of Anxiety and Irregular heartbeat. Family: family history includes COPD in her paternal grandfather; Cancer in her paternal grandmother; Cirrhosis in her father; Diabetes in her mother, paternal grandfather, and sister; Heart disease in her maternal grandmother; Hepatitis B in her father; Hypertension in her mother and sister; Varicose Veins in her mother.  Past Surgical History:  Procedure Laterality Date  . DILATION AND EVACUATION N/A 02/02/2017   Procedure: DILATATION AND EVACUATION;  Surgeon: Harlin Heys, MD;  Location: [facility] ORS;  Service: Gynecology;  Laterality: N/A;  . LUMBAR LAMINECTOMY/DECOMPRESSION MICRODISCECTOMY Left 01/11/2020   Procedure: LEFT L4-5 MICRODISCECTOMY;  Surgeon: Meade Maw, MD;  Location: [facility] ORS;  Service: Neurosurgery;  Laterality: Left;  . UPPER GI ENDOSCOPY    . WISDOM TOOTH EXTRACTION     Active Ambulatory Problems    Diagnosis Date Noted  . Abdominal cramping 12/22/2016  . 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 Ganglion and cyst of synovium, tendon, and bursa 09/25/2010  . Uterine  contractions during pregnancy 06/20/2018  . Term pregnancy 06/21/2018  . SVD (spontaneous vaginal delivery) 06/21/2018  . Second-degree perineal laceration, with delivery 06/21/2018  . Postpartum care following vaginal delivery (11/4) 06/21/2018  . Lumbar radicular pain 01/11/2020  . Status post lumbar spine surgery for decompression of spinal cord (Left L4/5: 01/11/20) 08/27/2020   Resolved Ambulatory Problems    Diagnosis Date Noted  . Missed menses 12/10/2016   Past Medical History:  Diagnosis Date  . Anxiety   . Irregular heartbeat    Constitutional Exam  General appearance: Well nourished, well developed, and well hydrated. In no apparent acute distress Vitals:   08/27/20 1342  BP: 138/73  Pulse: 93  Resp: 16  Temp: (!) 97.1 F (36.2 C)  TempSrc: Temporal  SpO2: 99%  Weight: 240 lb (108.9 kg)  Height: _0  (1.727 m)   BMI Assessment: Estimated body mass index is 36.49 kg/m as calculated from the following:   Height as of this encounter: _1  (1.727 m).   Weight as of this encounter: 240 lb (108.9 kg).  BMI interpretation table: BMI level Category Range association with higher incidence of chronic pain  <18 kg/m2 Underweight   18.5-24.9 kg/m2 Ideal body weight   25-29.9 kg/m2 Overweight Increased incidence by 20%  30-34.9 kg/m2 Obese (Class I) Increased incidence by 68%  35-39.9 kg/m2 Severe obesity (Class  II) Increased incidence by 136%  >40 kg/m2 Extreme obesity (Class III) Increased incidence by 254%   Patient's current BMI Ideal Body weight  Body mass index is 36.49 kg/m. Ideal body weight: 63.9 kg (140 lb 14 oz) Adjusted ideal body weight: 81.9 kg (180 lb 8.4 oz)   BMI Readings from Last 4 Encounters:  08/27/20 36.49 kg/m  01/11/20 36.50 kg/m  01/09/20 36.49 kg/m  10/10/18 32.89 kg/m   Wt Readings from Last 4 Encounters:  08/27/20 240 lb (108.9 kg)  01/11/20 240 lb 1.3 oz (108.9 kg)  01/09/20 240 lb (108.9 kg)  10/10/18 210 lb (95.3 kg)     Psych/Mental status: Alert, oriented x 3 (person, place, & time)       Eyes: PERLA Respiratory: No evidence of acute respiratory distress  Cervical Spine Exam  Skin & Axial Inspection: No masses, redness, edema, swelling, or associated skin lesions Alignment: Symmetrical Functional ROM: Unrestricted ROM      Stability: No instability detected Muscle Tone/Strength: Functionally intact. No obvious neuro-muscular anomalies detected. Sensory (Neurological): Unimpaired Palpation: No palpable anomalies              Upper Extremity (UE) Exam    Side: Right upper extremity  Side: Left upper extremity  Skin & Extremity Inspection: Skin color, temperature, and hair growth are WNL. No peripheral edema or cyanosis. No masses, redness, swelling, asymmetry, or associated skin lesions. No contractures.  Skin & Extremity Inspection: Skin color, temperature, and hair growth are WNL. No peripheral edema or cyanosis. No masses, redness, swelling, asymmetry, or associated skin lesions. No contractures.  Functional ROM: Unrestricted ROM          Functional ROM: Unrestricted ROM          Muscle Tone/Strength: Functionally intact. No obvious neuro-muscular anomalies detected.   Muscle Tone/Strength: Functionally intact. No obvious neuro-muscular anomalies detected.  Sensory (Neurological): Unimpaired          Sensory (Neurological): Unimpaired          Palpation: No palpable anomalies              Palpation: No palpable anomalies              Provocative Test(s):  Phalen's test: deferred Tinel's test: deferred Apley's scratch test (touch opposite shoulder):  Action 1 (Across chest): deferred Action 2 (Overhead): deferred Action 3 (LB reach): deferred   Provocative Test(s):  Phalen's test: deferred Tinel's test: deferred Apley's scratch test (touch opposite shoulder):  Action 1 (Across chest): deferred Action 2 (Overhead): deferred Action 3 (LB reach): deferred     Lumbar Exam  Skin & Axial  Inspection: No masses, redness, or swelling Alignment: Symmetrical Functional ROM: Pain restricted ROM       Stability: No instability detected Muscle Tone/Strength: Functionally intact. No obvious neuro-muscular anomalies detected. Sensory (Neurological): Dermatomal pain pattern RIGHT Palpation: No palpable anomalies       Provocative Tests: Hyperextension/rotation test: deferred today       Lumbar quadrant test (Kemp's test): (+) on the right for foraminal stenosis Lateral bending test: (+) ipsilateral radicular pain, on the right. Positive for right-sided foraminal stenosis. Patrick's Maneuver: deferred today                   FABER* test: deferred today                   S-I anterior distraction/compression test: deferred today         S-I lateral compression  test: deferred today         S-I Thigh-thrust test: deferred today         S-I Gaenslen's test: deferred today         *(Flexion, ABduction and External Rotation)  Gait & Posture Assessment  Ambulation: Unassisted Gait: Relatively normal for age and body habitus Posture: WNL   Lower Extremity Exam    Side: Right lower extremity  Side: Left lower extremity  Stability: No instability observed          Stability: No instability observed          Skin & Extremity Inspection: Skin color, temperature, and hair growth are WNL. No peripheral edema or cyanosis. No masses, redness, swelling, asymmetry, or associated skin lesions. No contractures.  Skin & Extremity Inspection: Skin color, temperature, and hair growth are WNL. No peripheral edema or cyanosis. No masses, redness, swelling, asymmetry, or associated skin lesions. No contractures.  Functional ROM: Unrestricted ROM          Functional ROM:          Pain restricted ROM for hip and knee joints Limited SLR (straight leg raise)  Muscle Tone/Strength: Functionally intact. No obvious neuro-muscular anomalies detected.  Muscle Tone/Strength: Functionally intact. No obvious  neuro-muscular anomalies detected.  Sensory (Neurological): Dermatomal pain pattern        Sensory (Neurological): Unimpaired        DTR: Patellar: deferred today Achilles: deferred today Plantar: deferred today  DTR: Patellar: deferred today Achilles: deferred today Plantar: deferred today  Palpation: No palpable anomalies  Palpation: No palpable anomalies   Assessment  Primary Diagnosis & Pertinent Problem List: The primary encounter diagnosis was Lumbar radicular pain (left). 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  Visit Diagnosis (New problems to examiner): 1. Lumbar radicular pain (left)   2. 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  Given that she has had lumbar spine surgery, I do not feel comfortable doing a blind lumbar epidural steroid injection for her case.  If the patient had not had prior lumbar spine surgery, I informed her that I would have offered her a lumbar epidural steroid injection without fluoroscopy using landmark-based techniques having her sit on the side of our exam table in a forward flexed position.  However given that she has had lumbar spine surgery, I have no way of confirming what interspace I am entering.  For her, I would like to avoid the left L4-L5 interspace given her prior lumbar spine surgery but have no way of ensuring that via a blind approach so I think it is best to not proceed with blind L-ESI.  I discussed this with the patient.  All questions were answered.  Follow-up as needed.  Provider-requested follow-up: Return if symptoms worsen or fail to  improve.   No future appointments.  Note by: Gillis Santa, MD Date: 08/27/2020; Time: 2:55 PM

## 2020-08-27 NOTE — Progress Notes (Signed)
Safety precautions to be maintained throughout the outpatient stay will include: orient to surroundings, keep bed in low position, maintain call bell within reach at all times, provide assistance with transfer out of bed and ambulation.  

## 2021-09-02 ENCOUNTER — Encounter: Payer: Self-pay | Admitting: *Deleted

## 2021-09-02 ENCOUNTER — Other Ambulatory Visit: Payer: Self-pay

## 2021-09-02 ENCOUNTER — Emergency Department
Admission: EM | Admit: 2021-09-02 | Discharge: 2021-09-02 | Disposition: A | Discharge status: Home / Self Care | Payer: BC Managed Care – PPO | Attending: Emergency Medicine | Admitting: Emergency Medicine

## 2021-09-02 DIAGNOSIS — H538 Other visual disturbances: Secondary | ICD-10-CM | POA: Insufficient documentation

## 2021-09-02 DIAGNOSIS — Z5321 Procedure and treatment not carried out due to patient leaving prior to being seen by health care provider: Secondary | ICD-10-CM | POA: Insufficient documentation

## 2021-09-02 DIAGNOSIS — R519 Headache, unspecified: Secondary | ICD-10-CM | POA: Diagnosis present

## 2021-09-02 LAB — BASIC METABOLIC PANEL
Anion gap: 8 (ref 5–15)
BUN: 19 mg/dL (ref 6–20)
CO2: 24 mmol/L (ref 22–32)
Calcium: 8.6 mg/dL — ABNORMAL LOW (ref 8.9–10.3)
Chloride: 107 mmol/L (ref 98–111)
Creatinine, Ser: 0.73 mg/dL (ref 0.44–1.00)
GFR, Estimated: >60 mL/min (ref >60)
Glucose, Bld: 110 mg/dL — ABNORMAL HIGH (ref 70–99)
Potassium: 3.5 mmol/L (ref 3.5–5.1)
Sodium: 139 mmol/L (ref 135–145)

## 2021-09-02 LAB — CBC
HCT: 38.3 % (ref 36.0–46.0)
Hemoglobin: 12.8 g/dL (ref 12.0–15.0)
MCH: 28.3 pg (ref 26.0–34.0)
MCHC: 33.4 g/dL (ref 30.0–36.0)
MCV: 84.7 fL (ref 80.0–100.0)
Platelets: 214 10*3/uL (ref 150–400)
RBC: 4.52 MIL/uL (ref 3.87–5.11)
RDW: 12.8 % (ref 11.5–15.5)
WBC: 6.2 10*3/uL (ref 4.0–10.5)
nRBC: 0 % (ref 0.0–0.2)

## 2021-09-02 NOTE — ED Triage Notes (Signed)
Pt to triage via wheelchair. Pt had LP yesterday at [facility] ER.  Today, pt has a headache, blurred vision.  Pt taking otc meds without relief.  Pt alert  speech clear.   Pt holding head and is tremoring in triage.

## 2021-09-05 ENCOUNTER — Other Ambulatory Visit: Payer: Self-pay | Admitting: Neurosurgery

## 2021-09-05 DIAGNOSIS — G9600 Cerebrospinal fluid leak, unspecified: Secondary | ICD-10-CM

## 2021-09-05 NOTE — ED Notes (Signed)
Chart reviewed post LWBS, pt seen at [facility] ED 09/03/21.

## 2021-09-06 ENCOUNTER — Other Ambulatory Visit: Payer: Self-pay | Admitting: Physical Medicine and Rehabilitation

## 2021-09-09 ENCOUNTER — Inpatient Hospital Stay
Admission: RE | Admit: 2021-09-09 | Discharge: 2021-09-09 | Disposition: A | Discharge status: Home / Self Care | Payer: BC Managed Care – PPO | Source: Ambulatory Visit | Attending: Neurosurgery | Admitting: Neurosurgery

## 2021-09-09 NOTE — Discharge Instructions (Signed)

## 2022-01-07 HISTORY — PX: TONSILLECTOMY: SUR1361

## 2022-01-19 ENCOUNTER — Other Ambulatory Visit: Payer: Self-pay

## 2022-01-19 ENCOUNTER — Ambulatory Visit: Payer: Self-pay

## 2022-01-19 ENCOUNTER — Emergency Department
Admission: EM | Admit: 2022-01-19 | Discharge: 2022-01-19 | Disposition: A | Discharge status: Home / Self Care | Payer: BC Managed Care – PPO | Attending: Emergency Medicine | Admitting: Emergency Medicine

## 2022-01-19 ENCOUNTER — Encounter: Payer: Self-pay | Admitting: Emergency Medicine

## 2022-01-19 DIAGNOSIS — H9202 Otalgia, left ear: Secondary | ICD-10-CM | POA: Diagnosis present

## 2022-01-19 DIAGNOSIS — H66002 Acute suppurative otitis media without spontaneous rupture of ear drum, left ear: Secondary | ICD-10-CM | POA: Diagnosis not present

## 2022-01-19 MED ORDER — AMOXICILLIN 500 MG PO CAPS
500.0000 mg | ORAL_CAPSULE | Freq: Once | ORAL | Status: AC
  Administered 2022-01-19: 500 mg via ORAL
  Filled 2022-01-19: qty 1

## 2022-01-19 MED ORDER — OFLOXACIN 0.3 % OT SOLN: 5.0000 [drp] | Freq: Two times a day (BID) | OTIC | 0 refills | Status: AC

## 2022-01-19 MED ORDER — AMOXICILLIN 500 MG PO TABS: 500.0000 mg | ORAL_TABLET | Freq: Two times a day (BID) | ORAL | 0 refills | Status: AC

## 2022-01-19 MED ORDER — KETOROLAC TROMETHAMINE 60 MG/2ML IM SOLN
30.0000 mg | Freq: Once | INTRAMUSCULAR | Status: AC
  Administered 2022-01-19: 30 mg via INTRAMUSCULAR
  Filled 2022-01-19: qty 2

## 2022-01-19 MED ORDER — OFLOXACIN 0.3 % OP SOLN
5.0000 [drp] | Freq: Every day | OPHTHALMIC | Status: DC
  Administered 2022-01-19: 5 [drp] via OTIC
  Filled 2022-01-19: qty 5

## 2022-01-19 NOTE — ED Triage Notes (Signed)
Pt arrived via POV with c/o L ear pain that started tonight.

## 2022-01-19 NOTE — ED Notes (Signed)
Pt took oxycodone at 0145 and has felt no relief.

## 2022-01-19 NOTE — ED Provider Notes (Signed)
[facility] Provider Note    Event Date/Time   First MD Initiated Contact with Patient 01/19/22 0330     (approximate)   History   Otalgia   HPI  Adriana Lewis is a 26 y.o. female who presents for evaluation of left ear pain.  Patient reports that she had tonsillectomy 12 days ago and fully recovered from it.  This evening she started having left ear pain.  She felt some discharge coming from her ear.  She took an oxycodone but reports that it did not touch the pain.  She is complaining of 10 out of 10 pain.  She denies sore throat, difficulty swallowing, neck pain, fever or chills     Past Medical History:  Diagnosis Date   Anxiety    Irregular heartbeat    pt reports history of palpitations    Past Surgical History:  Procedure Laterality Date   DILATION AND EVACUATION N/A 02/02/2017   Procedure: DILATATION AND EVACUATION;  Surgeon: Linzie Collin, MD;  Location: [facility] ORS;  Service: Gynecology;  Laterality: N/A;   LUMBAR LAMINECTOMY/DECOMPRESSION MICRODISCECTOMY Left 01/11/2020   Procedure: LEFT L4-5 MICRODISCECTOMY;  Surgeon: Venetia Night, MD;  Location: [facility] ORS;  Service: Neurosurgery;  Laterality: Left;   TONSILLECTOMY  01/07/2022   UPPER GI ENDOSCOPY     WISDOM TOOTH EXTRACTION       Physical Exam   Triage Vital Signs: ED Triage Vitals  Enc Vitals Group     BP 01/19/22 0321 (!) 153/105     Pulse Rate 01/19/22 0321 (!) 104     Resp 01/19/22 0321 20     Temp 01/19/22 0321 98.5 F (36.9 C)     Temp Source 01/19/22 0321 Oral     SpO2 01/19/22 0321 94 %     Weight 01/19/22 0318 230 lb (104.3 kg)     Height 01/19/22 0318 5\' 8"  (1.727 m)     Head Circumference --      Peak Flow --      Pain Score 01/19/22 0318 9     Pain Loc --      Pain Edu? --      Excl. in GC? --     Most recent vital signs: Vitals:   01/19/22 0321 01/19/22 0409  BP: (!) 153/105 128/87  Pulse: (!) 104 (!) 106  Resp: 20 18  Temp: 98.5 F  (36.9 C)   SpO2: 94% 100%     Constitutional: Alert and oriented. Well appearing and in no apparent distress. HEENT:      Head: Normocephalic and atraumatic.         Eyes: Conjunctivae are normal. Sclera is non-icteric.       Mouth/Throat: Mucous membranes are moist.  Well-healing post tonsillectomy oropharynx     Ear: Left external canal is erythematous, TM is bulging with a blister on it and cloudy      Neck: Supple with no signs of meningismus. Cardiovascular: Regular rate and rhythm.   Respiratory: Normal respiratory effort.  Musculoskeletal:  No edema, cyanosis, or erythema of extremities. Neurologic: Normal speech and language. Face is symmetric. Moving all extremities. No gross focal neurologic deficits are appreciated. Skin: Skin is warm, dry and intact. No rash noted. Psychiatric: Mood and affect are normal. Speech and behavior are normal.  ED Results / Procedures / Treatments   Labs (all labs ordered are listed, but only abnormal results are displayed) Labs Reviewed - No data to display   EKG  none   RADIOLOGY none   PROCEDURES:  Critical Care performed: No  Procedures    IMPRESSION / MDM / ASSESSMENT AND PLAN / ED COURSE  I reviewed the triage vital signs and the nursing notes.  26 y.o. female who presents for evaluation of left ear pain.  On exam patient has otitis media but also some irritation of the external canal.  TM is intact.  She was started on ofloxacin drops and amoxicillin.  Recommend close follow-up with her ENT or primary care doctor and discussed my standard return precautions IM Toradol given for pain   MEDICATIONS GIVEN IN ED: Medications  ofloxacin (OCUFLOX) 0.3 % ophthalmic solution 5 drop (5 drops Left EAR Given 01/19/22 0407)  ketorolac (TORADOL) injection 30 mg (30 mg Intramuscular Given 01/19/22 0353)  amoxicillin (AMOXIL) capsule 500 mg (500 mg Oral Given 01/19/22 0353)   EMR reviewed including records from her tonsillectomy from 12  days ago    FINAL CLINICAL IMPRESSION(S) / ED DIAGNOSES   Final diagnoses:  Non-recurrent acute suppurative otitis media of left ear without spontaneous rupture of tympanic membrane     Rx / DC Orders   ED Discharge Orders          Ordered    amoxicillin (AMOXIL) 500 MG tablet  2 times daily        01/19/22 0434    ofloxacin (FLOXIN) 0.3 % OTIC solution  2 times daily        01/19/22 0434             Note:  This document was prepared using Dragon voice recognition software and may include unintentional dictation errors.   Please note:  Patient was evaluated in Emergency Department today for the symptoms described in the history of present illness. Patient was evaluated in the context of the global COVID-19 pandemic, which necessitated consideration that the patient might be at risk for infection with the SARS-CoV-2 virus that causes COVID-19. Institutional protocols and algorithms that pertain to the evaluation of patients at risk for COVID-19 are in a state of rapid change based on information released by regulatory bodies including the CDC and federal and state organizations. These policies and algorithms were followed during the patient's care in the ED.  Some ED evaluations and interventions may be delayed as a result of limited staffing during the pandemic.       Don Perking, [place], MD 01/19/22 [id number]

## 2022-01-19 NOTE — ED Notes (Signed)
Pt states that she had a tonsillectomy on the 23rd, throat irritation began yesterday but resolved,  woke up 3x between 11p and 1 am this morning w/L ear pain described as pressure.  States that it feels like it's filling up, popping and draining, clear liquid visualized by pt.  Pain/pressure is worse right before it pops, then 30 sec of relief then begins again.  Sound is muted, like under water per pt.

## 2022-02-16 ENCOUNTER — Emergency Department: Payer: BC Managed Care – PPO

## 2022-02-16 ENCOUNTER — Encounter: Payer: Self-pay | Admitting: Emergency Medicine

## 2022-02-16 ENCOUNTER — Other Ambulatory Visit: Payer: Self-pay

## 2022-02-16 ENCOUNTER — Emergency Department
Admission: EM | Admit: 2022-02-16 | Discharge: 2022-02-16 | Disposition: A | Discharge status: Home / Self Care | Payer: BC Managed Care – PPO | Attending: Emergency Medicine | Admitting: Emergency Medicine

## 2022-02-16 DIAGNOSIS — I880 Nonspecific mesenteric lymphadenitis: Secondary | ICD-10-CM | POA: Diagnosis not present

## 2022-02-16 DIAGNOSIS — R109 Unspecified abdominal pain: Secondary | ICD-10-CM | POA: Diagnosis present

## 2022-02-16 LAB — CBC
HCT: 36.1 % (ref 36.0–46.0)
Hemoglobin: 11.7 g/dL — ABNORMAL LOW (ref 12.0–15.0)
MCH: 29.2 pg (ref 26.0–34.0)
MCHC: 32.4 g/dL (ref 30.0–36.0)
MCV: 90 fL (ref 80.0–100.0)
Platelets: 231 10*3/uL (ref 150–400)
RBC: 4.01 MIL/uL (ref 3.87–5.11)
RDW: 12.9 % (ref 11.5–15.5)
WBC: 5.7 10*3/uL (ref 4.0–10.5)
nRBC: 0 % (ref 0.0–0.2)

## 2022-02-16 LAB — COMPREHENSIVE METABOLIC PANEL
ALT: 15 U/L (ref 0–44)
AST: 16 U/L (ref 15–41)
Albumin: 4 g/dL (ref 3.5–5.0)
Alkaline Phosphatase: 58 U/L (ref 38–126)
Anion gap: 8 (ref 5–15)
BUN: 10 mg/dL (ref 6–20)
CO2: 26 mmol/L (ref 22–32)
Calcium: 8.8 mg/dL — ABNORMAL LOW (ref 8.9–10.3)
Chloride: 106 mmol/L (ref 98–111)
Creatinine, Ser: 0.7 mg/dL (ref 0.44–1.00)
GFR, Estimated: >60 mL/min (ref >60)
Glucose, Bld: 102 mg/dL — ABNORMAL HIGH (ref 70–99)
Potassium: 3.9 mmol/L (ref 3.5–5.1)
Sodium: 140 mmol/L (ref 135–145)
Total Bilirubin: 0.7 mg/dL (ref 0.3–1.2)
Total Protein: 7.1 g/dL (ref 6.5–8.1)

## 2022-02-16 LAB — URINALYSIS, ROUTINE W REFLEX MICROSCOPIC
Bilirubin Urine: NEGATIVE
Glucose, UA: NEGATIVE mg/dL
Hgb urine dipstick: NEGATIVE
Ketones, ur: NEGATIVE mg/dL
Leukocytes,Ua: NEGATIVE
Nitrite: NEGATIVE
Protein, ur: NEGATIVE mg/dL
Specific Gravity, Urine: 1.015 (ref 1.005–1.030)
pH: 6 (ref 5.0–8.0)

## 2022-02-16 LAB — POC URINE PREG, ED: Preg Test, Ur: NEGATIVE

## 2022-02-16 LAB — LIPASE, BLOOD: Lipase: 28 U/L (ref 11–51)

## 2022-02-16 MED ORDER — DICYCLOMINE HCL 10 MG PO CAPS
10.0000 mg | ORAL_CAPSULE | Freq: Once | ORAL | Status: AC
  Administered 2022-02-16: 10 mg via ORAL
  Filled 2022-02-16: qty 1

## 2022-02-16 MED ORDER — ONDANSETRON HCL 4 MG PO TABS: 4.0000 mg | ORAL_TABLET | Freq: Three times a day (TID) | ORAL | 0 refills | Status: DC | PRN

## 2022-02-16 MED ORDER — DICYCLOMINE HCL 10 MG PO CAPS: 10.0000 mg | ORAL_CAPSULE | Freq: Three times a day (TID) | ORAL | 0 refills | Status: DC | PRN

## 2022-02-16 MED ORDER — IOHEXOL 300 MG/ML  SOLN
100.0000 mL | Freq: Once | INTRAMUSCULAR | Status: AC | PRN
  Administered 2022-02-16: 100 mL via INTRAVENOUS

## 2022-02-16 NOTE — ED Triage Notes (Signed)
Pt via POV from home. Pt c/o back pain, low grade fever, emesis, and urinary urgency that started today. Pt is A&OX4 and NAD

## 2022-02-16 NOTE — Discharge Instructions (Addendum)
Please seek medical attention for any high fevers, chest pain, shortness of breath, change in behavior, persistent vomiting, bloody stool or any other new or concerning symptoms.  

## 2022-02-16 NOTE — ED Provider Notes (Signed)
[facility] Provider Note    Event Date/Time   First MD Initiated Contact with Patient 02/16/22 2038     (approximate)   History   Urinary Tract Infection   HPI  Adriana Lewis is a 26 y.o. female  who presents to the emergency department today because of concerns for abdominal pain, back pain, nausea vomiting.  Patient states that her symptoms started today.  Says that she has history of UTIs so was wondering if it was another urinary tract infection.  She however denies any dysuria although feels like she has had a hard time completely voiding recently.  Patient denies any fevers.  Physical Exam   Triage Vital Signs: ED Triage Vitals  Enc Vitals Group     BP 02/16/22 1827 113/67     Pulse Rate 02/16/22 1827 92     Resp 02/16/22 1827 20     Temp 02/16/22 1825 99.2 F (37.3 C)     Temp Source 02/16/22 1825 Oral     SpO2 02/16/22 1827 98 %     Weight 02/16/22 1825 215 lb (97.5 kg)     Height 02/16/22 1825 5\' 8"  (1.727 m)     Head Circumference --      Peak Flow --      Pain Score 02/16/22 1825 7     General: Awake, alert, oriented.  CV:  Good peripheral perfusion. Regular rate and rhythm. Resp:  Normal effort. Lungs clear. Abd:  No distention. Diffusely tender to palpation.     ED Results / Procedures / Treatments   Labs (all labs ordered are listed, but only abnormal results are displayed) Labs Reviewed  COMPREHENSIVE METABOLIC PANEL - Abnormal; Notable for the following components:      Result Value   Glucose, Bld 102 (*)    Calcium 8.8 (*)    All other components within normal limits  CBC - Abnormal; Notable for the following components:   Hemoglobin 11.7 (*)    All other components within normal limits  URINALYSIS, ROUTINE W REFLEX MICROSCOPIC - Abnormal; Notable for the following components:   Color, Urine YELLOW (*)    APPearance HAZY (*)    All other components within normal limits  URINE CULTURE  LIPASE, BLOOD   POC URINE PREG, ED     EKG  None   RADIOLOGY I independently interpreted and visualized the ct abd/pel. My interpretation: No free air Radiology interpretation:  IMPRESSION:  1. Findings consistent with mesenteric adenitis.     PROCEDURES:  Critical Care performed: No  Procedures   MEDICATIONS ORDERED IN ED: Medications - No data to display   IMPRESSION / MDM / ASSESSMENT AND PLAN / ED COURSE  I reviewed the triage vital signs and the nursing notes.                              Differential diagnosis includes, but is not limited to, appendicitis, diverticulitis, pyelonephritis, kidney stone.  Patient's presentation is most consistent with acute presentation with potential threat to life or bodily function.  Patient presented to the emergency department today because of concerns for COVID with pain, back pain nausea and vomiting.  Urine here without signs consistent with infection.  Did obtain a CT scan given concerns for intra-abdominal infection.  CT scan was consistent with mesenteric adenitis.  I discussed this with the patient.  Discussed symptomatic treatment.  Will discharge with Bentyl and  Zofran.  FINAL CLINICAL IMPRESSION(S) / ED DIAGNOSES   Final diagnoses:  Mesenteric adenitis     Note:  This document was prepared using Dragon voice recognition software and may include unintentional dictation errors.    Phineas Semen, MD 02/16/22 2258

## 2022-02-18 LAB — URINE CULTURE

## 2022-02-26 IMAGING — RF DG LUMBAR SPINE 2-3V
1 series · 4 of 4 positions shown · non-contrast
Comparison: None.

CLINICAL DATA: L4-5 discectomy

EXAM:
LUMBAR SPINE - 2-3 VIEW

[Series 1: dg x-ray · 0.20mm/px · 4 of 4 slices shown]
[im 1/4]
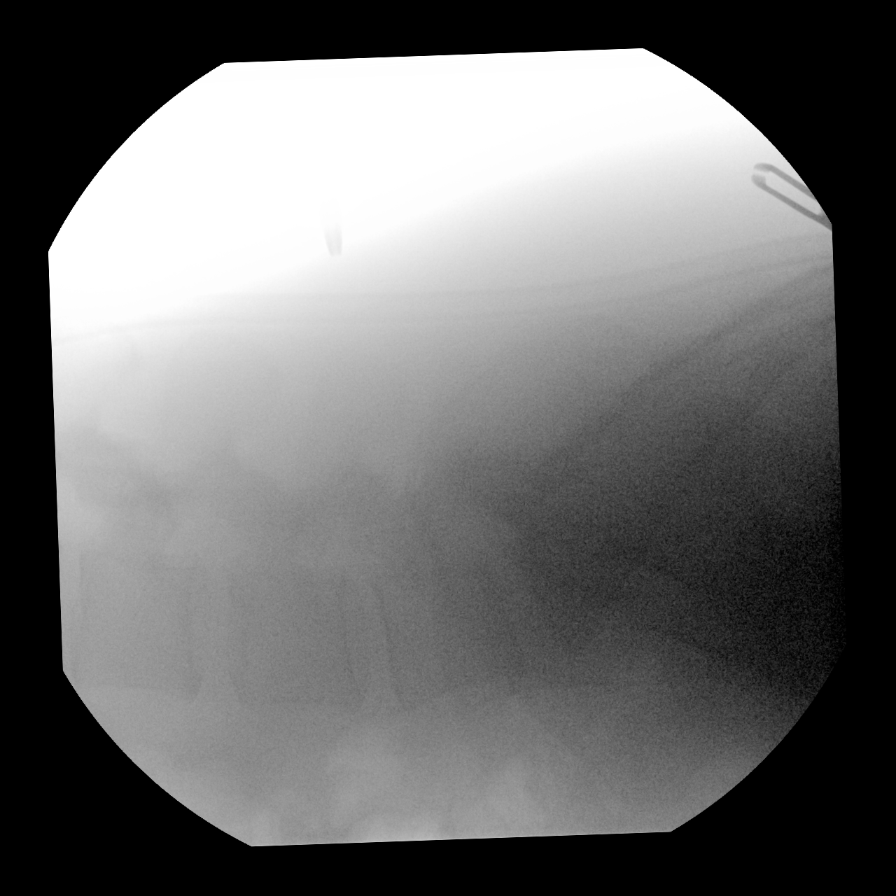
[im 2/4]
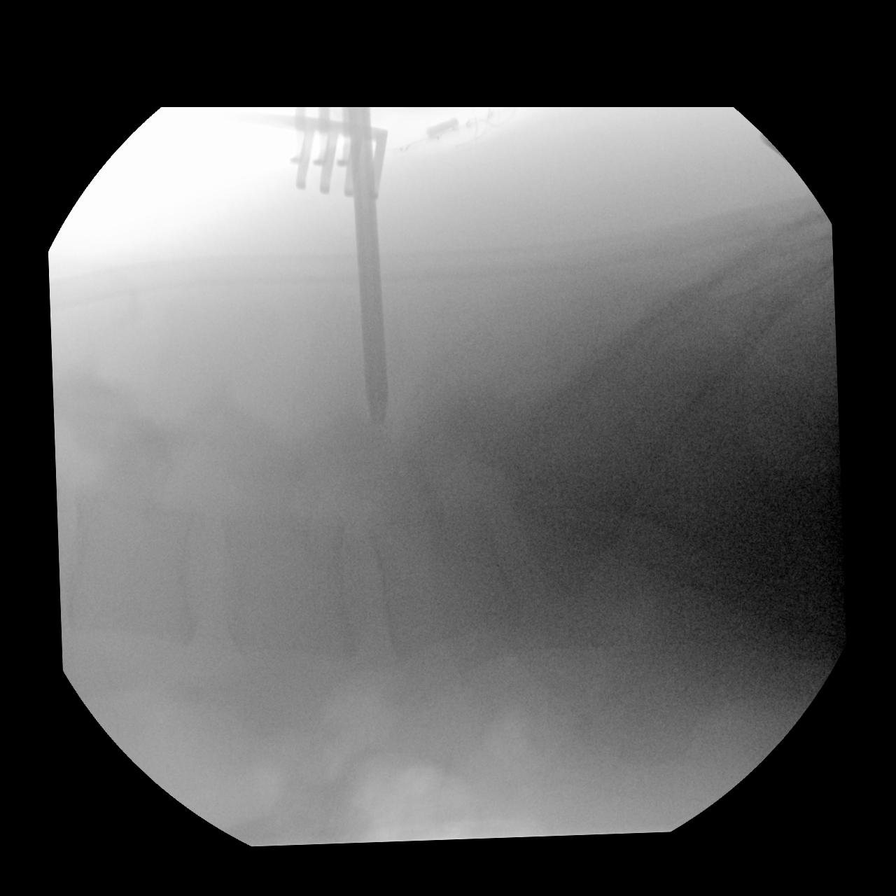
[im 3/4]
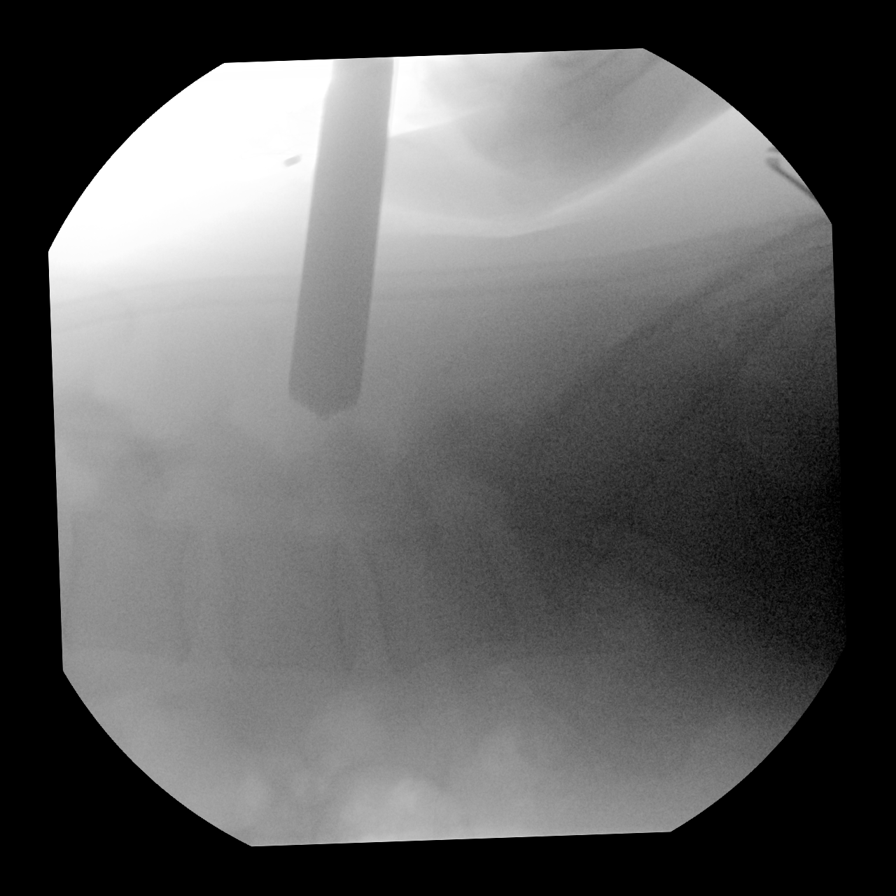
[im 4/4]
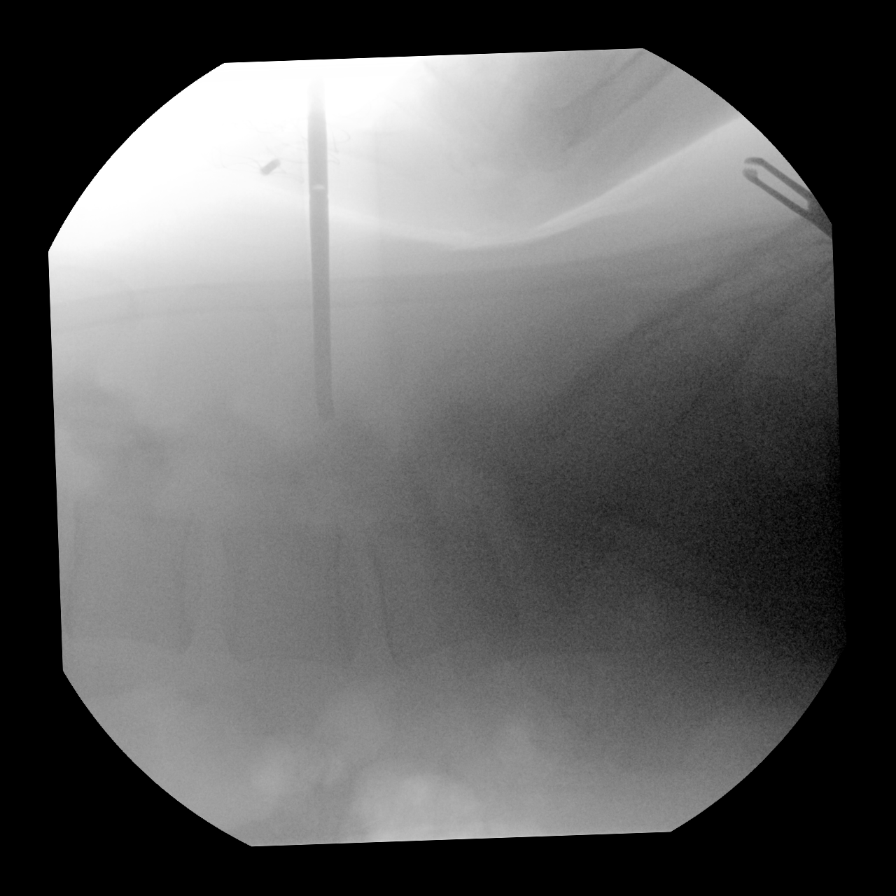

[4 of 4 positions shown; findings below may reference images not displayed]

FLUOROSCOPY TIME:  Radiation Exposure Index (as provided by the
fluoroscopic device): 5.80 mGy

If the device does not provide the exposure index:

Fluoroscopy Time:  6 seconds

Number of Acquired Images:  4
FINDINGS: Initial images demonstrate localization at L4-5 at the skin surface.
Subsequent surgical instruments are seen at the L4-5 level
IMPRESSION: Intraoperative localization at L4-5.

## 2022-03-12 ENCOUNTER — Ambulatory Visit
Admission: RE | Admit: 2022-03-12 | Discharge: 2022-03-12 | Disposition: A | Discharge status: Home / Self Care | Payer: BC Managed Care – PPO | Source: Ambulatory Visit | Attending: Urgent Care | Admitting: Urgent Care

## 2022-03-12 VITALS — BP 124/82 | HR 84 | Temp 98.6°F | Resp 20

## 2022-03-12 DIAGNOSIS — R197 Diarrhea, unspecified: Secondary | ICD-10-CM | POA: Diagnosis not present

## 2022-03-12 DIAGNOSIS — A084 Viral intestinal infection, unspecified: Secondary | ICD-10-CM | POA: Diagnosis not present

## 2022-03-12 DIAGNOSIS — R112 Nausea with vomiting, unspecified: Secondary | ICD-10-CM

## 2022-03-12 LAB — POCT URINALYSIS DIP (MANUAL ENTRY)
Bilirubin, UA: NEGATIVE
Blood, UA: NEGATIVE
Glucose, UA: NEGATIVE mg/dL
Ketones, POC UA: NEGATIVE mg/dL
Nitrite, UA: NEGATIVE
Protein Ur, POC: NEGATIVE mg/dL
Spec Grav, UA: 1.02 (ref 1.010–1.025)
Urobilinogen, UA: 0.2 E.U./dL
pH, UA: 5.5 (ref 5.0–8.0)

## 2022-03-12 LAB — POCT URINE PREGNANCY: Preg Test, Ur: NEGATIVE

## 2022-03-12 MED ORDER — DICYCLOMINE HCL 10 MG PO CAPS: 10.0000 mg | ORAL_CAPSULE | Freq: Three times a day (TID) | ORAL | 0 refills | Status: DC | PRN

## 2022-03-12 MED ORDER — ONDANSETRON HCL 4 MG/2ML IJ SOLN
4.0000 mg | Freq: Once | INTRAMUSCULAR | Status: AC
  Administered 2022-03-12: 4 mg via INTRAMUSCULAR

## 2022-03-12 NOTE — ED Provider Notes (Addendum)
[facility]   MRN: [id number] DOB: 10-04-1995  Subjective:   Adriana Lewis is a 26 y.o. female presenting for 3 day history of nausea, vomiting, diarrhea. Has had ~5-7 episodes of each and is with liquids and solids. Has had associated belly pain, lower abdominal is cramping, sharp more on the right side. Patient was seen through the emergency room 02/16/2022, diagnosed with mesenteric adenitis.  Complete lab results are as below. Patient recovered well from that episode. No bloody stools, hematemesis, antibiotic use, hospitalizations. No history of GI disorders. Had IV fluids done at an IV hydration clinic.   No current facility-administered medications for this encounter.  Current Outpatient Medications:    acetaminophen (TYLENOL) 325 MG tablet, Take 2 tablets (650 mg total) by mouth every 4 (four) hours as needed for mild pain ((score 1 to 3) or temp > 100.5)., Disp:  , Rfl:    dicyclomine (BENTYL) 10 MG capsule, Take 1 capsule (10 mg total) by mouth 3 (three) times daily as needed (abdominal pain)., Disp: 15 capsule, Rfl: 0   ondansetron (ZOFRAN) 4 MG tablet, Take 1 tablet (4 mg total) by mouth every 8 (eight) hours as needed for vomiting or nausea., Disp: 20 tablet, Rfl: 0   polyethylene glycol (MIRALAX / GLYCOLAX) 17 g packet, Take 17 g by mouth daily as needed for mild constipation., Disp: 14 each, Rfl: 0   Prenatal Vit-Fe Fumarate-FA (PNV PRENATAL PLUS MULTIVITAMIN) 27-1 MG TABS, Take 1 tablet by mouth daily., Disp: , Rfl:    senna (SENOKOT) 8.6 MG TABS tablet, Take 1 tablet (8.6 mg total) by mouth 2 (two) times daily. (Patient not taking: Reported on 08/27/2020), Disp: 120 tablet, Rfl: 0   sertraline (ZOLOFT) 25 MG tablet, Take 75 mg by mouth daily. , Disp: , Rfl:    Allergies  Allergen Reactions   Latex Hives and Rash    Other reaction(s): Other (See Comments)   Sulfa Antibiotics Hives and Rash    Other reaction(s): UNKNOWN Other reaction(s):  UNKNOWN Other reaction(s): UNKNOWN     Past Medical History:  Diagnosis Date   Anxiety    Irregular heartbeat    pt reports history of palpitations     Past Surgical History:  Procedure Laterality Date   DILATION AND EVACUATION N/A 02/02/2017   Procedure: DILATATION AND EVACUATION;  Surgeon: Linzie Collin, MD;  Location: [facility];  Service: Gynecology;  Laterality: N/A;   LUMBAR LAMINECTOMY/DECOMPRESSION MICRODISCECTOMY Left 01/11/2020   Procedure: LEFT L4-5 MICRODISCECTOMY;  Surgeon: Venetia Night, MD;  Location: [facility];  Service: Neurosurgery;  Laterality: Left;   TONSILLECTOMY  01/07/2022   UPPER GI ENDOSCOPY     WISDOM TOOTH EXTRACTION      Family History  Problem Relation Age of Onset   Diabetes Mother    Hypertension Mother    Varicose Veins Mother    Hepatitis B Father    Cirrhosis Father    Diabetes Sister    Hypertension Sister    Heart disease Maternal Grandmother    Cancer Paternal Grandmother        stomach   Diabetes Paternal Grandfather    COPD Paternal Grandfather     Social History   Tobacco Use   Smoking status: Never   Smokeless tobacco: Never  Vaping Use   Vaping Use: Never used  Substance Use Topics   Alcohol use: No    Comment: rarely 2-3 glasses of wine per year   Drug use: No  ROS   Objective:   Vitals: BP 124/82   Pulse 84   Temp 98.6 F (37 C)   Resp 20   LMP 01/09/2022 (Exact Date)   SpO2 98%   Physical Exam Constitutional:      General: She is not in acute distress.    Appearance: Normal appearance. She is well-developed. She is not ill-appearing, toxic-appearing or diaphoretic.  HENT:     Head: Normocephalic and atraumatic.     Nose: Nose normal.     Mouth/Throat:     Mouth: Mucous membranes are moist.  Eyes:     General: No scleral icterus.       Right eye: No discharge.        Left eye: No discharge.     Extraocular Movements: Extraocular movements intact.     Conjunctiva/sclera: Conjunctivae  normal.  Cardiovascular:     Rate and Rhythm: Normal rate and regular rhythm.     Heart sounds: Normal heart sounds. No murmur heard.    No friction rub. No gallop.  Pulmonary:     Effort: Pulmonary effort is normal. No respiratory distress.     Breath sounds: No stridor. No wheezing, rhonchi or rales.  Chest:     Chest wall: No tenderness.  Abdominal:     General: Bowel sounds are normal. There is no distension.     Palpations: Abdomen is soft. There is no mass.     Tenderness: There is abdominal tenderness (generalized and superficial). There is no right CVA tenderness, left CVA tenderness, guarding or rebound.  Skin:    General: Skin is warm and dry.  Neurological:     General: No focal deficit present.     Mental Status: She is alert and oriented to person, place, and time.  Psychiatric:        Mood and Affect: Mood normal.        Behavior: Behavior normal.        Thought Content: Thought content normal.        Judgment: Judgment normal.    Results for orders placed or performed during the hospital encounter of 03/12/22 (from the past 24 hour(s))  POCT urinalysis dipstick     Status: Abnormal   Collection Time: 03/12/22  7:28 PM  Result Value Ref Range   Color, UA yellow yellow   Clarity, UA clear clear   Glucose, UA negative negative mg/dL   Bilirubin, UA negative negative   Ketones, POC UA negative negative mg/dL   Spec Grav, UA 3.716 9.678 - 1.025   Blood, UA negative negative   pH, UA 5.5 5.0 - 8.0   Protein Ur, POC negative negative mg/dL   Urobilinogen, UA 0.2 0.2 or 1.0 E.U./dL   Nitrite, UA Negative Negative   Leukocytes, UA Small (1+) (A) Negative  POCT urine pregnancy     Status: Normal   Collection Time: 03/12/22  7:28 PM  Result Value Ref Range   Preg Test, Ur Negative Negative    CT ABDOMEN PELVIS W CONTRAST  Result Date: 02/16/2022 CLINICAL DATA:  Back pain with vomiting, low-grade fever and urinary urgency. EXAM: CT ABDOMEN AND PELVIS WITH CONTRAST  TECHNIQUE: Multidetector CT imaging of the abdomen and pelvis was performed using the standard protocol following bolus administration of intravenous contrast. RADIATION DOSE REDUCTION: This exam was performed according to the departmental dose-optimization program which includes automated exposure control, adjustment of the mA and/or kV according to patient size and/or use of iterative reconstruction technique. CONTRAST:  OMNIPAQUE IOHEXOL 300 MG/ML  SOLN COMPARISON:  None Available. FINDINGS: Lower chest: No acute abnormality. Hepatobiliary: No focal liver abnormality is seen. No gallstones, gallbladder wall thickening, or biliary dilatation. Pancreas: Unremarkable. No pancreatic ductal dilatation or surrounding inflammatory changes. Spleen: Normal in size without focal abnormality. Adrenals/Urinary Tract: Adrenal glands are unremarkable. Kidneys are normal in size, without renal calculi or hydronephrosis. A 1.5 cm diameter simple cyst is seen within the medial aspect of the mid right kidney. No additional follow-up or imaging is recommended. Bladder is unremarkable. Stomach/Bowel: Stomach is within normal limits. Appendix appears normal. No evidence of bowel wall thickening, distention, or inflammatory changes. Vascular/Lymphatic: No significant vascular findings are present. Subcentimeter para-aortic and mesenteric lymph nodes are seen. Multiple 1.2 cm and 1.5 cm mesenteric lymph nodes are also noted within the mid to upper abdomen. Reproductive: Uterus and bilateral adnexa are unremarkable. Other: No abdominal wall hernia or abnormality. No abdominopelvic ascites. Musculoskeletal: No acute or significant osseous findings. IMPRESSION: 1. Findings consistent with mesenteric adenitis. Electronically Signed   By: Aram Candela M.D.   On: 02/16/2022 22:23    Recent Results (from the past 2160 hour(s))  Lipase, blood     Status: None   Collection Time: 02/16/22  6:27 PM  Result Value Ref Range    Lipase 28 11 - 51 U/L    Comment: Performed at [facility], [address] [place], [place] [address]  Comprehensive metabolic panel     Status: Abnormal   Collection Time: 02/16/22  6:27 PM  Result Value Ref Range   Sodium 140 135 - 145 mmol/L   Potassium 3.9 3.5 - 5.1 mmol/L   Chloride 106 98 - 111 mmol/L   CO2 26 22 - 32 mmol/L   Glucose, Bld 102 (H) 70 - 99 mg/dL    Comment: Glucose reference range applies only to samples taken after fasting for at least 8 hours.   BUN 10 6 - 20 mg/dL   Creatinine, Ser 0.09 0.44 - 1.00 mg/dL   Calcium 8.8 (L) 8.9 - 10.3 mg/dL   Total Protein 7.1 6.5 - 8.1 g/dL   Albumin 4.0 3.5 - 5.0 g/dL   AST 16 15 - 41 U/L   ALT 15 0 - 44 U/L   Alkaline Phosphatase 58 38 - 126 U/L   Total Bilirubin 0.7 0.3 - 1.2 mg/dL   GFR, Estimated >38 >18 mL/min    Comment: (NOTE) Calculated using the CKD-EPI Creatinine Equation (2021)    Anion gap 8 5 - 15    Comment: Performed at [facility], [address] [place], [place] [address]  CBC     Status: Abnormal   Collection Time: 02/16/22  6:27 PM  Result Value Ref Range   WBC 5.7 4.0 - 10.5 K/uL   RBC 4.01 3.87 - 5.11 MIL/uL   Hemoglobin 11.7 (L) 12.0 - 15.0 g/dL   HCT 16.9 67.8 - 93.8 %   MCV 90.0 80.0 - 100.0 fL   MCH 29.2 26.0 - 34.0 pg   MCHC 32.4 30.0 - 36.0 g/dL   RDW 10.1 75.1 - 02.5 %   Platelets 231 150 - 400 K/uL   nRBC 0.0 0.0 - 0.2 %    Comment: Performed at [facility], [address] [place], [place] [address]  Urinalysis, Routine w reflex microscopic     Status: Abnormal   Collection Time: 02/16/22  6:27 PM  Result Value Ref Range   Color, Urine YELLOW (A)  YELLOW   APPearance HAZY (A) CLEAR   Specific Gravity, Urine 1.015 1.005 - 1.030   pH 6.0 5.0 - 8.0   Glucose, UA NEGATIVE NEGATIVE mg/dL   Hgb urine dipstick NEGATIVE NEGATIVE   Bilirubin Urine NEGATIVE NEGATIVE   Ketones, ur NEGATIVE NEGATIVE mg/dL   Protein, ur NEGATIVE NEGATIVE mg/dL   Nitrite  NEGATIVE NEGATIVE   Leukocytes,Ua NEGATIVE NEGATIVE    Comment: Performed at [facility], [address], [place], [place] [address]  Urine Culture     Status: Abnormal   Collection Time: 02/16/22  6:28 PM   Specimen: Urine, Clean Catch  Result Value Ref Range   Specimen Description      URINE, CLEAN CATCH Performed at [facility], [address], [place], [place] [address]    Special Requests      NONE Performed at [facility], [address], [place], [place] [address]    Culture MULTIPLE SPECIES PRESENT, SUGGEST RECOLLECTION (A)    Report Status 02/18/2022 FINAL   POC urine preg, ED     Status: None   Collection Time: 02/16/22  6:44 PM  Result Value Ref Range   Preg Test, Ur NEGATIVE NEGATIVE    Comment:        THE SENSITIVITY OF THIS METHODOLOGY IS >24 mIU/mL    IM Zofran in clinic.  Assessment and Plan :   PDMP not reviewed this encounter.  1. Viral gastroenteritis   2. Nausea vomiting and diarrhea    Will manage for suspected viral gastroenteritis with supportive care.  Recommended patient hydrate well, eat light meals and maintain electrolytes.  Will use Zofran and Bentyl for nausea, vomiting and diarrhea. No signs of an acute abdomen. Counseled patient on potential for adverse effects with medications prescribed/recommended today, ER and return-to-clinic precautions discussed, patient verbalized understanding.   Wallis Bamberg, PA-C 03/12/22 2000

## 2022-03-12 NOTE — Discharge Instructions (Addendum)
Make sure you push fluids drinking mostly water but mix it with Gatorade.  Try to eat light meals including soups, broths and soft foods, fruits.  You may use Zofran for your nausea and vomiting once every 8 hours.  Bentyl can help with diarrhea but use this carefully limiting it to 1-3 times per day only if you are having a lot of diarrhea.  Please return to the clinic if symptoms worsen or you start having severe abdominal pain not helped by taking Tylenol or start having bloody stools or blood in the vomit.

## 2022-03-12 NOTE — ED Triage Notes (Signed)
Pt here with 3 day hx of N/V/D along with headache. Unable to keep anything down. Went and got a bag of IV fluids yesterday just in case.

## 2022-03-14 ENCOUNTER — Ambulatory Visit (INDEPENDENT_AMBULATORY_CARE_PROVIDER_SITE_OTHER): Payer: BC Managed Care – PPO | Admitting: Neurosurgery

## 2022-03-14 VITALS — BP 118/78 | Ht 68.0 in | Wt 254.0 lb

## 2022-03-14 DIAGNOSIS — M545 Low back pain, unspecified: Secondary | ICD-10-CM

## 2022-03-14 NOTE — Progress Notes (Signed)
Referring Physician:  Judie Petit, MD [address] [place],  [place] [address]  Primary Physician:  Judie Petit, MD  History of Present Illness: 03/14/2022 [name] is a 26 y.o with a history of mesenteric adenitis,  who is here today with a chief complaint of 1 year of low back pain. She states this started after giving birth about a year ago.  Her symptoms have worsened despite conservative treatment with home exercises and stretching as well as chiropractic adjustment a couple days ago.  She states that they are addressing her tailbone and this causes significant worsening of her left-sided low back and posterior hip pain.  She describes the pain as a sharp pain that is worse with turning over in bed and getting up from a seated position if she has been sitting for prolonged period of time.  She denies any pain that radiates down her legs.  Ultimately her pain is significantly impacting her quality of life and her ability to take care of her children.  Conservative measures:  Physical therapy: Home exercises Multimodal medical therapy including regular antiinflammatories: Tylenol with some mild improvement Injections: No recent epidural steroid injections  Past Surgery: Left L4-5 microdiscectomy on 01/11/20  Gae Lanora Manis Forbis has no symptoms of cervical myelopathy.  The symptoms are causing a significant impact on the patient's life.   Review of Systems:  A 10 point review of systems is negative, except for the pertinent positives and negatives detailed in the HPI.  Past Medical History: Past Medical History:  Diagnosis Date   Anxiety    Irregular heartbeat    pt reports history of palpitations    Past Surgical History: Past Surgical History:  Procedure Laterality Date   DILATION AND EVACUATION N/A 02/02/2017   Procedure: DILATATION AND EVACUATION;  Surgeon: Linzie Collin, MD;  Location: [facility];  Service: Gynecology;  Laterality: N/A;    LUMBAR LAMINECTOMY/DECOMPRESSION MICRODISCECTOMY Left 01/11/2020   Procedure: LEFT L4-5 MICRODISCECTOMY;  Surgeon: Venetia Night, MD;  Location: [facility] OR[facility];  Service: Neurosurgery;  Laterality: Left;   TONSILLECTOMY  01/07/2022   UPPER GI ENDOSCOPY     WISDOM TOOTH EXTRACTION      Allergies: Allergies as of 03/14/2022 - Review Complete 03/12/2022  Allergen Reaction Noted   Latex Hives and Rash 02/01/2015   Sulfa antibiotics Hives and Rash 09/07/2013    Medications: Outpatient Encounter Medications as of 03/14/2022  Medication Sig   acetaminophen (TYLENOL) 325 MG tablet Take 2 tablets (650 mg total) by mouth every 4 (four) hours as needed for mild pain ((score 1 to 3) or temp > 100.5).   dicyclomine (BENTYL) 10 MG capsule Take 1 capsule (10 mg total) by mouth 3 (three) times daily as needed (abdominal pain).   ondansetron (ZOFRAN) 4 MG tablet Take 1 tablet (4 mg total) by mouth every 8 (eight) hours as needed for vomiting or nausea.   polyethylene glycol (MIRALAX / GLYCOLAX) 17 g packet Take 17 g by mouth daily as needed for mild constipation.   Prenatal Vit-Fe Fumarate-FA (PNV PRENATAL PLUS MULTIVITAMIN) 27-1 MG TABS Take 1 tablet by mouth daily.   senna (SENOKOT) 8.6 MG TABS tablet Take 1 tablet (8.6 mg total) by mouth 2 (two) times daily. (Patient not taking: Reported on 08/27/2020)   sertraline (ZOLOFT) 25 MG tablet Take 75 mg by mouth daily.    No facility-administered encounter medications on file as of 03/14/2022.    Social History: Social History   Tobacco Use  Smoking status: Never   Smokeless tobacco: Never  Vaping Use   Vaping Use: Never used  Substance Use Topics   Alcohol use: No    Comment: rarely 2-3 glasses of wine per year   Drug use: No    Family Medical History: Family History  Problem Relation Age of Onset   Diabetes Mother    Hypertension Mother    Varicose Veins Mother    Hepatitis B Father    Cirrhosis Father    Diabetes Sister    Hypertension  Sister    Heart disease Maternal Grandmother    Cancer Paternal Grandmother        stomach   Diabetes Paternal Grandfather    COPD Paternal Grandfather     Physical Examination:  Today's Vitals   03/14/22 1046  BP: 118/78  Weight: 115.2 kg  Height: 5\' 8"  (1.727 m)  PainSc: 3   PainLoc: Back   Body mass index is 38.62 kg/m.   General: Patient is well developed, well nourished, calm, collected, and in no apparent distress. Attention to examination is appropriate.  Psychiatric: Patient is non-anxious.  Head:  Pupils equal, round, and reactive to light.  ENT:  Oral mucosa appears well hydrated.  Neck:   Supple.    Respiratory: Patient is breathing without any difficulty.  Extremities: No edema.  Vascular: Palpable dorsal pedal pulses.  Skin:   On exposed skin, there are no abnormal skin lesions.  NEUROLOGICAL:     Awake, alert, oriented to person, place, and time.  Speech is clear and fluent. Fund of knowledge is appropriate.   Cranial Nerves: Pupils equal round and reactive to light.  Facial tone is symmetric.  Facial sensation is symmetric.   ROM of spine: Limited flexion extension of the lumbar spine due to pain palpation of spine: Tenderness to palpation over left SI joint  Strength: Side Biceps Triceps Deltoid Interossei Grip Wrist Ext. Wrist Flex.  R 5 5 5 5 5 5 5   L 5 5 5 5 5 5 5    Side Iliopsoas Quads Hamstring PF DF EHL  R 5 5 5 5 5 5   L 5 5 5 5 5 5    Reflexes are 2+ and symmetric at the biceps, triceps, brachioradialis, patella and achilles.   Hoffman's is absent.  Clonus is not present.  Toes are down-going.  Bilateral upper and lower extremity sensation is intact to light touch.    Gait is normal.     Medical Decision Making  Imaging: No recent imaging to review   Assessment and Plan: Ms. Chai is a pleasant 27 y.o. female with a year of low back pain worsening over the last several days which interferes with her quality of life.  While  she does not have any radicular component, given her history I like to obtain an updated MRI of her lumbar spine from moving forward with treatment options.  Should this be largely normal we discussed treatment with physical therapy and possible SI joint injection.  We may have her undergo pelvic floor PT to help with gluteal balance should her MRI be normal.  I will call her with the results of her MRI and further plan of care.  She was encouraged to call our office in the interim should she have any questions or concerns.  She expressed understanding was in agreement with this plan.  Thank you for involving me in the care of this patient.   This note was partially dictated using voice recognition  software, so please excuse any errors that were not corrected.  I spent a total of 37 minutes in both face-to-face and non-face-to-face activities for this visit on the date of this encounter including review of outside records, review of previous imaging, review of symptoms, physical exam, discussion of differential diagnosis and potential treatment options, placing orders, and documentation.  [facility]

## 2022-04-02 ENCOUNTER — Ambulatory Visit
Admission: RE | Admit: 2022-04-02 | Discharge: 2022-04-02 | Disposition: A | Discharge status: Home / Self Care | Payer: BC Managed Care – PPO | Source: Ambulatory Visit | Attending: Neurosurgery | Admitting: Neurosurgery

## 2022-04-02 DIAGNOSIS — M545 Low back pain, unspecified: Secondary | ICD-10-CM | POA: Diagnosis present

## 2022-04-09 ENCOUNTER — Encounter (HOSPITAL_COMMUNITY): Payer: Self-pay

## 2022-04-09 ENCOUNTER — Ambulatory Visit (INDEPENDENT_AMBULATORY_CARE_PROVIDER_SITE_OTHER): Payer: BC Managed Care – PPO | Admitting: Neurosurgery

## 2022-04-09 ENCOUNTER — Ambulatory Visit (HOSPITAL_COMMUNITY)
Admission: RE | Admit: 2022-04-09 | Discharge: 2022-04-09 | Disposition: A | Discharge status: Home / Self Care | Payer: BC Managed Care – PPO | Source: Ambulatory Visit

## 2022-04-09 VITALS — BP 119/74 | HR 82 | Temp 98.6°F

## 2022-04-09 DIAGNOSIS — G8929 Other chronic pain: Secondary | ICD-10-CM | POA: Diagnosis not present

## 2022-04-09 DIAGNOSIS — L0291 Cutaneous abscess, unspecified: Secondary | ICD-10-CM

## 2022-04-09 DIAGNOSIS — M533 Sacrococcygeal disorders, not elsewhere classified: Secondary | ICD-10-CM | POA: Diagnosis not present

## 2022-04-09 DIAGNOSIS — M545 Low back pain, unspecified: Secondary | ICD-10-CM

## 2022-04-09 DIAGNOSIS — L237 Allergic contact dermatitis due to plants, except food: Secondary | ICD-10-CM

## 2022-04-09 DIAGNOSIS — L02213 Cutaneous abscess of chest wall: Secondary | ICD-10-CM

## 2022-04-09 MED ORDER — DOXYCYCLINE HYCLATE 100 MG PO CAPS: 100.0000 mg | ORAL_CAPSULE | Freq: Two times a day (BID) | ORAL | 0 refills | Status: AC

## 2022-04-09 MED ORDER — CETIRIZINE HCL 10 MG PO TABS: 10.0000 mg | ORAL_TABLET | Freq: Every day | ORAL | 0 refills | Status: DC

## 2022-04-09 MED ORDER — TRIAMCINOLONE ACETONIDE 0.1 % EX CREA: 1.0000 | TOPICAL_CREAM | Freq: Two times a day (BID) | CUTANEOUS | 0 refills | Status: DC | PRN

## 2022-04-09 NOTE — ED Provider Notes (Signed)
[facility]    CSN: [id number] Arrival date & time: 04/09/22  1139      History   Chief Complaint Chief Complaint  Patient presents with   Abscess    Sore on left chest, red, swollen and warm to the touch. - Entered by patient    HPI Adriana Lewis is a 26 y.o. female.   HPI Patient presents today with a concern for a possible abscess on the upper abdomen.  She reports 2 days ago while removing her bra that she noticed some mild tenderness and was taken off her bra appearing on amount of pus was expressed from an area on her left lateral chest wall.  She reports the area has continued to be irritated with contact with her bra.  And she was unable to lay on her left side due to the severity of pain overnight.  She is here for evaluation.  Has no history of recurrent abscesses.  Unrelated she also has a rash on her lower left forearm.  Reports she was working outside and made contact with poison ivy.  She has been treating rash with calamine lotion however rash appears to be spreading laterally along her forearm.  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   Ganglion and cyst of synovium, tendon, and bursa 09/25/2010    Past Surgical History:  Procedure Laterality Date   DILATION AND EVACUATION N/A 02/02/2017   Procedure: DILATATION AND EVACUATION;  Surgeon: Linzie Collin, MD;   Location: [facility];  Service: Gynecology;  Laterality: N/A;   LUMBAR LAMINECTOMY/DECOMPRESSION MICRODISCECTOMY Left 01/11/2020   Procedure: LEFT L4-5 MICRODISCECTOMY;  Surgeon: Venetia Night, MD;  Location: [facility];  Service: Neurosurgery;  Laterality: Left;   TONSILLECTOMY  01/07/2022   UPPER GI ENDOSCOPY     WISDOM TOOTH EXTRACTION      OB History     Gravida  3   Para  1   Term  1   Preterm  0   AB  1   Living  1      SAB  1   IAB  0   Ectopic  0   Multiple  0   Live Births  1            Home Medications    Prior to Admission medications   Medication Sig Start Date End Date Taking? Authorizing Provider  cetirizine (ZYRTEC ALLERGY) 10 MG tablet Take 1 tablet (10 mg total) by mouth daily for 10 days. 04/09/22 04/19/22 Yes Bing Neighbors, FNP  doxycycline (VIBRAMYCIN) 100 MG capsule Take 1 capsule (100 mg total) by mouth 2 (two) times daily for 7 days. 04/09/22 04/16/22 Yes Bing Neighbors, FNP  triamcinolone cream (KENALOG) 0.1 % Apply 1 Application topically 2 (two) times daily as needed. 04/09/22  Yes Bing Neighbors, FNP  acetaminophen (TYLENOL) 325 MG tablet Take 2 tablets (650 mg total) by mouth every 4 (four)  hours as needed for mild pain ((score 1 to 3) or temp > 100.5). 01/11/20   Patsey Berthold, NP  dicyclomine (BENTYL) 10 MG capsule Take 1 capsule (10 mg total) by mouth 3 (three) times daily as needed (abdominal pain). 03/12/22   Wallis Bamberg, PA-C  FLUoxetine (PROZAC) 10 MG capsule Take 10 mg by mouth daily. 03/26/22   [provider]  ondansetron (ZOFRAN) 4 MG tablet Take 1 tablet (4 mg total) by mouth every 8 (eight) hours as needed for vomiting or nausea. 02/16/22   Phineas Semen, MD  polyethylene glycol (MIRALAX / GLYCOLAX) 17 g packet Take 17 g by mouth daily as needed for mild constipation. 01/11/20   Patsey Berthold, NP  Prenatal Vit-Fe Fumarate-FA (PNV PRENATAL PLUS MULTIVITAMIN) 27-1 MG TABS Take 1 tablet by mouth daily. 07/23/20    [provider]  senna (SENOKOT) 8.6 MG TABS tablet Take 1 tablet (8.6 mg total) by mouth 2 (two) times daily. 01/12/20   Patsey Berthold, NP  sertraline (ZOLOFT) 25 MG tablet Take 75 mg by mouth daily.  08/22/18   [provider]    Family History Family History  Problem Relation Age of Onset   Diabetes Mother    Hypertension Mother    Varicose Veins Mother    Hepatitis B Father    Cirrhosis Father    Diabetes Sister    Hypertension Sister    Heart disease Maternal Grandmother    Cancer Paternal Grandmother        stomach   Diabetes Paternal Grandfather    COPD Paternal Grandfather     Social History Social History   Tobacco Use   Smoking status: Never   Smokeless tobacco: Never  Vaping Use   Vaping Use: Never used  Substance Use Topics   Alcohol use: No    Comment: rarely 2-3 glasses of wine per year   Drug use: No     Allergies   Latex and Sulfa antibiotics   Review of Systems Review of Systems Pertinent negatives listed in HPI   Physical Exam Triage Vital Signs ED Triage Vitals  Enc Vitals Group     BP 04/09/22 1210 119/74     Pulse Rate 04/09/22 1210 82     Resp --      Temp 04/09/22 1210 98.6 F (37 C)     Temp Source 04/09/22 1210 Oral     SpO2 04/09/22 1210 97 %     Weight --      Height --      Head Circumference --      Peak Flow --      Pain Score 04/09/22 1209 8     Pain Loc --      Pain Edu? --      Excl. in GC? --    No data found.  Updated Vital Signs BP 119/74 (BP Location: Right Arm)   Pulse 82   Temp 98.6 F (37 C) (Oral)   LMP 03/30/2022 (Exact Date)   SpO2 97%   Visual Acuity Right Eye Distance:   Left Eye Distance:   Bilateral Distance:    Right Eye Near:   Left Eye Near:    Bilateral Near:     Physical Exam Constitutional:      Appearance: Normal appearance.  HENT:     Head: Normocephalic and atraumatic.     Nose: Nose normal.  Eyes:     Extraocular Movements: Extraocular movements intact.      Pupils:  Pupils are equal, round, and reactive to light.  Cardiovascular:     Rate and Rhythm: Normal rate and regular rhythm.  Pulmonary:     Effort: Pulmonary effort is normal.     Breath sounds: Normal breath sounds.  Skin:    General: Skin is warm.     Capillary Refill: Capillary refill takes less than 2 seconds.     Findings: Abscess and rash present. Rash is urticarial and vesicular.     Comments: Abscess left lateral upper chest wall, 4 mm with mild induration with harden boarders and  erythema   Left forearm, vesicular, urticarial rash    Neurological:     Mental Status: She is alert.  Psychiatric:        Attention and Perception: Attention normal.        Mood and Affect: Mood normal.        Speech: Speech normal.        Behavior: Behavior normal.      UC Treatments / Results  Labs (all labs ordered are listed, but only abnormal results are displayed) Labs Reviewed - No data to display  EKG   Radiology No results found.  Procedures Procedures (including critical care time)  Medications Ordered in UC Medications - No data to display  Initial Impression / Assessment and Plan / UC Course  I have reviewed the triage vital signs and the nursing notes.  Pertinent labs & imaging results that were available during my care of the patient were reviewed by me and considered in my medical decision making (see chart for details).     Abscess previously drained independently and mild induration with hardness surrounding the boarder. No indication for I&D. Treating with a round of doxycyline likely will resolve residual infection.  For contact dermatitis associated with poison ivy localized on the lower left arm treatment with triamcinolone cream twice daily and cetirizine.  Patient advised to return if any of her symptoms worsen or do not improve. Final Clinical Impressions(s) / UC Diagnoses   Final diagnoses:  Abscess  Contact dermatitis due to poison ivy      Discharge Instructions      Complete entire course of medication. Triamcinolone cream to be applied to poison ivy dermatitis rash.  Apply twice daily as needed until rash resolves.  Take cetirizine once daily this will help with the itching and your body's reaction to exposure to poison ivy.  Return as needed.      ED Prescriptions     Medication Sig Dispense Auth. Provider   doxycycline (VIBRAMYCIN) 100 MG capsule Take 1 capsule (100 mg total) by mouth 2 (two) times daily for 7 days. 14 capsule Bing Neighbors, FNP   triamcinolone cream (KENALOG) 0.1 % Apply 1 Application topically 2 (two) times daily as needed. 80 g Bing Neighbors, FNP   cetirizine (ZYRTEC ALLERGY) 10 MG tablet Take 1 tablet (10 mg total) by mouth daily for 10 days. 10 tablet Bing Neighbors, FNP      PDMP not reviewed this encounter.   Bing Neighbors, FNP 04/09/22 1929

## 2022-04-09 NOTE — Progress Notes (Signed)
Neurosurgery Telephone (Audio-Only) Note  Requesting Provider     Susanne Borders, PA [address] [place],  [place] [address] T: [phone] F: [phone]  Primary Care Provider Judie Petit, MD [address] [place] [address] T: [phone] F: [phone]  Telehealth visit was conducted with Adriana Lewis, a 26 y.o. female via telephone.   History of Present Illness: Adriana Lewis is a 26 y.o presenting today via telephone visit to review her recent MRI results.  He continues to have back and left hip pain unchanged from previous.  She denies any new or worsening symptoms.  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 She states this started after giving birth about a year ago.  Her symptoms have worsened despite conservative treatment with home exercises and stretching as well as chiropractic adjustment a couple days ago.  She states that they are addressing her tailbone and this causes significant worsening of her left-sided low back and posterior hip pain.  She describes the pain as a sharp pain that is worse with turning over in bed and getting up from a seated position if she has been sitting for prolonged period of time.  She denies any pain that radiates down her legs.  Ultimately her pain is significantly impacting her quality of life and her ability to take care of her children.   Conservative measures:  Physical therapy: Home exercises Multimodal medical therapy including regular antiinflammatories: Tylenol with some mild improvement Injections: No recent epidural steroid injections   Past Surgery: Left L4-5 microdiscectomy on 01/11/20   Adriana Lewis has no symptoms of cervical myelopathy.   The symptoms are causing a significant impact on the patient's life.   General Review of Systems:  A ROS was performed  including pertinent positive and negatives as documented.  All other systems are negative.  Prior to Admission medications   Medication Sig Start Date End Date Taking? Authorizing Provider  acetaminophen (TYLENOL) 325 MG tablet Take 2 tablets (650 mg total) by mouth every 4 (four) hours as needed for mild pain ((score 1 to 3) or temp > 100.5). 01/11/20   Adriana Berthold, NP  dicyclomine (BENTYL) 10 MG capsule Take 1 capsule (10 mg total) by mouth 3 (three) times daily as needed (abdominal pain). 03/12/22   Wallis Bamberg, PA-C  ondansetron (ZOFRAN) 4 MG tablet Take 1 tablet (4 mg total) by mouth every 8 (eight) hours as needed for vomiting or nausea. 02/16/22   Phineas Semen, MD  polyethylene glycol (MIRALAX / GLYCOLAX) 17 g packet Take 17 g by mouth daily as needed for mild constipation. 01/11/20   Adriana Berthold, NP  Prenatal Vit-Fe Fumarate-FA (PNV PRENATAL PLUS MULTIVITAMIN) 27-1 MG TABS Take 1 tablet by mouth daily. 07/23/20   [provider]  senna (SENOKOT) 8.6 MG TABS tablet Take 1 tablet (8.6 mg total) by mouth 2 (two) times daily. 01/12/20   Adriana Berthold, NP  sertraline (ZOLOFT) 25 MG tablet Take 75 mg by mouth daily.  08/22/18   [provider]    DATA REVIEWED    Imaging Studies  MRI L spine 04/02/22 FINDINGS: Segmentation:  Standard.   Alignment:  Physiologic.   Vertebrae:  No fracture, evidence of discitis, or bone lesion.   Conus medullaris and cauda equina: Conus extends to the L1-L2 level. Conus and cauda equina  appear normal.   Paraspinal and other soft tissues: 1.7 cm right renal simple cyst. No follow-up imaging is recommended. Otherwise negative.   Disc levels:   T11-T12: Moderate left subarticular disc protrusion with flattening of the left ventral cord. No stenosis.   T12-L1:  Minimal disc bulging.  No stenosis.   L1-L2:  Minimal disc bulging.  No stenosis.   L2-L3:  Minimal disc bulging.  No stenosis.   L3-L4:  Minimal disc bulging.  No  stenosis.   L4-L5: Prior left hemilaminectomy. Minimal disc bulging with central annular fissure. Mild bilateral lateral recess stenosis. No spinal canal or neuroforaminal stenosis.   L5-S1: Negative disc. Mild bilateral facet arthropathy. No stenosis.   IMPRESSION: 1. Mild degenerative changes of the lumbar spine as described above. Mild bilateral lateral recess stenosis at L4-L5. 2. Moderate left subarticular disc protrusion at T11-T12 with flattening of the left ventral cord. No stenosis.     Electronically Signed   By: Obie Dredge M.D.   On: 04/04/2022 12:27  Laboratory Studies   Recent Results (from the past 2160 hour(s))  Lipase, blood     Status: None   Collection Time: 02/16/22  6:27 PM  Result Value Ref Range   Lipase 28 11 - 51 U/L    Comment: Performed at [facility], [address] [place], [place] [address]  Comprehensive metabolic panel     Status: Abnormal   Collection Time: 02/16/22  6:27 PM  Result Value Ref Range   Sodium 140 135 - 145 mmol/L   Potassium 3.9 3.5 - 5.1 mmol/L   Chloride 106 98 - 111 mmol/L   CO2 26 22 - 32 mmol/L   Glucose, Bld 102 (H) 70 - 99 mg/dL    Comment: Glucose reference range applies only to samples taken after fasting for at least 8 hours.   BUN 10 6 - 20 mg/dL   Creatinine, Ser 6.44 0.44 - 1.00 mg/dL   Calcium 8.8 (L) 8.9 - 10.3 mg/dL   Total Protein 7.1 6.5 - 8.1 g/dL   Albumin 4.0 3.5 - 5.0 g/dL   AST 16 15 - 41 U/L   ALT 15 0 - 44 U/L   Alkaline Phosphatase 58 38 - 126 U/L   Total Bilirubin 0.7 0.3 - 1.2 mg/dL   GFR, Estimated >03 >47 mL/min    Comment: (NOTE) Calculated using the CKD-EPI Creatinine Equation (2021)    Anion gap 8 5 - 15    Comment: Performed at [facility], [address] [place], [place] [address]  CBC     Status: Abnormal   Collection Time: 02/16/22  6:27 PM  Result Value Ref Range   WBC 5.7 4.0 - 10.5 K/uL   RBC 4.01 3.87 - 5.11 MIL/uL   Hemoglobin 11.7 (L) 12.0 -  15.0 g/dL   HCT 63.8 75.6 - 43.3 %   MCV 90.0 80.0 - 100.0 fL   MCH 29.2 26.0 - 34.0 pg   MCHC 32.4 30.0 - 36.0 g/dL   RDW 29.5 18.8 - 41.6 %   Platelets 231 150 - 400 K/uL   nRBC 0.0 0.0 - 0.2 %    Comment: Performed at [facility], [address] [place], [place] [address]  Urinalysis, Routine w reflex microscopic     Status: Abnormal   Collection Time: 02/16/22  6:27 PM  Result Value Ref Range   Color, Urine YELLOW (A) YELLOW   APPearance HAZY (A) CLEAR   Specific Gravity, Urine  1.015 1.005 - 1.030   pH 6.0 5.0 - 8.0   Glucose, UA NEGATIVE NEGATIVE mg/dL   Hgb urine dipstick NEGATIVE NEGATIVE   Bilirubin Urine NEGATIVE NEGATIVE   Ketones, ur NEGATIVE NEGATIVE mg/dL   Protein, ur NEGATIVE NEGATIVE mg/dL   Nitrite NEGATIVE NEGATIVE   Leukocytes,Ua NEGATIVE NEGATIVE    Comment: Performed at [facility], [address] [place], [place] [address]  Urine Culture     Status: Abnormal   Collection Time: 02/16/22  6:28 PM   Specimen: Urine, Clean Catch  Result Value Ref Range   Specimen Description      URINE, CLEAN CATCH Performed at [facility], [address] [place], [place] [address]    Special Requests      NONE Performed at [facility], [address], [place], [place] [address]    Culture MULTIPLE SPECIES PRESENT, SUGGEST RECOLLECTION (A)    Report Status 02/18/2022 FINAL   POC urine preg, ED     Status: None   Collection Time: 02/16/22  6:44 PM  Result Value Ref Range   Preg Test, Ur NEGATIVE NEGATIVE    Comment:        THE SENSITIVITY OF THIS METHODOLOGY IS >24 mIU/mL   POCT urinalysis dipstick     Status: Abnormal   Collection Time: 03/12/22  7:28 PM  Result Value Ref Range   Color, UA yellow yellow   Clarity, UA clear clear   Glucose, UA negative negative mg/dL   Bilirubin, UA negative negative   Ketones, POC UA negative negative mg/dL   Spec Grav, UA 7.544 9.201 - 1.025   Blood, UA negative negative   pH, UA  5.5 5.0 - 8.0   Protein Ur, POC negative negative mg/dL   Urobilinogen, UA 0.2 0.2 or 1.0 E.U./dL   Nitrite, UA Negative Negative   Leukocytes, UA Small (1+) (A) Negative  POCT urine pregnancy     Status: Normal   Collection Time: 03/12/22  7:28 PM  Result Value Ref Range   Preg Test, Ur Negative Negative     IMPRESSION  Ms. Arnott is a 26 y.o. female who I performed a telephone encounter today for evaluation and management of: back and left hip pain  PLAN  Ms. Galka is a pleasant 76 y.o with a 1 year history of left-sided low back and hip pain.  Her recent MRI is without explanatory cause of this.  I recommended further evaluation by PM&R and consideration of SI vs hip injections. We discussed potentially adding physical therapy however she has not done well with this in the past and is concerned that it would worsen her symptoms.  She would like to hold off on this at this time.  I will see her going forward on an as-needed basis.  She expressed understanding was in agreement with this plan.  No orders of the defined types were placed in this encounter.   DISPOSITION  Follow up: In person appointment in  PRN  Susanne Borders, PA Neurosurgery  TELEPHONE DOCUMENTATION   Patient location: home Provider location: office  I spent a total of 5 minutes non-face-to-face activities for this visit on the date of this encounter including review of current clinical condition and response to treatment.

## 2022-04-09 NOTE — ED Triage Notes (Signed)
The patient c/o sore to the left upper side of her abd (under bra). The patient states the area has been draining and she does have tenderness to the area.   There is an inflamed abscess to the area.   Started: Sunday

## 2022-04-09 NOTE — Discharge Instructions (Signed)
Complete entire course of medication. Triamcinolone cream to be applied to poison ivy dermatitis rash.  Apply twice daily as needed until rash resolves.  Take cetirizine once daily this will help with the itching and your body's reaction to exposure to poison ivy.  Return as needed.

## 2022-05-13 NOTE — Progress Notes (Deleted)
Follow-up note: Referring Physician:  Sharlet Salina, MD [place],  [place] [address]  Primary Physician:  Derenda Mis, MD  Chief Complaint:  Left sided LBP and hip pain f/u   History of Present Illness: Lakaya Argelia Thurow is a 26 y.o. female last did an audio visit with Danielle on 04/09/22. MRI of lumbar spine showed mild degenerative changes, no good explanation for her pain.   She had left SI joint injection with Dr. Sharlet Salina on 04/23/22.   No recent PT.   History of microdiscectomy L4-L5 by Dr. Izora Ribas on 01/11/20.   Review of Systems:  A 10 point review of systems is negative, except for *** and the pertinent positives and negatives detailed in the HPI.  Past Medical History: Past Medical History:  Diagnosis Date   Anxiety    Irregular heartbeat    pt reports history of palpitations    Past Surgical History: Past Surgical History:  Procedure Laterality Date   DILATION AND EVACUATION N/A 02/02/2017   Procedure: DILATATION AND EVACUATION;  Surgeon: Harlin Heys, MD;  Location: [facility];  Service: Gynecology;  Laterality: N/A;   LUMBAR LAMINECTOMY/DECOMPRESSION MICRODISCECTOMY Left 01/11/2020   Procedure: LEFT L4-5 MICRODISCECTOMY;  Surgeon: Meade Maw, MD;  Location: [facility];  Service: Neurosurgery;  Laterality: Left;   TONSILLECTOMY  01/07/2022   UPPER GI ENDOSCOPY     WISDOM TOOTH EXTRACTION      Allergies: Allergies as of 05/15/2022 - Review Complete 04/09/2022  Allergen Reaction Noted   Latex Hives and Rash 02/01/2015   Sulfa antibiotics Hives and Rash 09/07/2013    Medications: Outpatient Encounter Medications as of 05/15/2022  Medication Sig   acetaminophen (TYLENOL) 325 MG tablet Take 2 tablets (650 mg total) by mouth every 4 (four) hours as needed for mild pain ((score 1 to 3) or temp > 100.5).   cetirizine (ZYRTEC ALLERGY) 10 MG tablet Take 1 tablet (10 mg total) by mouth daily for 10 days.   dicyclomine  (BENTYL) 10 MG capsule Take 1 capsule (10 mg total) by mouth 3 (three) times daily as needed (abdominal pain).   FLUoxetine (PROZAC) 10 MG capsule Take 10 mg by mouth daily.   ondansetron (ZOFRAN) 4 MG tablet Take 1 tablet (4 mg total) by mouth every 8 (eight) hours as needed for vomiting or nausea.   polyethylene glycol (MIRALAX / GLYCOLAX) 17 g packet Take 17 g by mouth daily as needed for mild constipation.   Prenatal Vit-Fe Fumarate-FA (PNV PRENATAL PLUS MULTIVITAMIN) 27-1 MG TABS Take 1 tablet by mouth daily.   senna (SENOKOT) 8.6 MG TABS tablet Take 1 tablet (8.6 mg total) by mouth 2 (two) times daily.   sertraline (ZOLOFT) 25 MG tablet Take 75 mg by mouth daily.    triamcinolone cream (KENALOG) 0.1 % Apply 1 Application topically 2 (two) times daily as needed.   No facility-administered encounter medications on file as of 05/15/2022.    Social History: Social History   Tobacco Use   Smoking status: Never   Smokeless tobacco: Never  Vaping Use   Vaping Use: Never used  Substance Use Topics   Alcohol use: No    Comment: rarely 2-3 glasses of wine per year   Drug use: No    Family Medical History: Family History  Problem Relation Age of Onset   Diabetes Mother    Hypertension Mother    Varicose Veins Mother    Hepatitis B Father    Cirrhosis Father  Diabetes Sister    Hypertension Sister    Heart disease Maternal Grandmother    Cancer Paternal Grandmother        stomach   Diabetes Paternal Grandfather    COPD Paternal Grandfather     Exam: @VITALWITHPAIN @  General: { :23897::"Dizziness","Lightheadedness","Shaking","Sweating"}.    Patient has *** gait.   *** ROM of spine with *** pain. *** tenderness in *** lumbar spine.   Strength in the lower extremities:  Left EHL 5/5   Right EHL 5/5 Left Dorsiflexion 5/5  Right Dorsiflexion 5/5 Left Plantar flexion 5/5 Right Plantar flexion 5/5 Left Hamstring 5/5  Right Hamstring 5/5 Left Quadriceps 5/5  Right  Quadriceps 5/5  Reflexes are ***+ and symmetric at the patella and achilles.    Bilateral lower extremity sensation is intact to light touch.   Clonus is {positive_negative:25962}.     Imaging: Nothing new to review.   I have personally reviewed the images and agree with the above interpretation.  Assessment and Plan: Ms. Askari is a pleasant 26 y.o. female with ***  Above treatment options discussed with patient and following plan made:   - Order for physical therapy for lumbar spine ***. - Continue on current medications including ***. Reviewed proper dosing along with risks and benefits. Take and NSAIDs with food.  - Conservative therapy has been ineffective at relieving the symptoms. The patient would likely benefit from surgical intervention.  We discussed **** along with the details of the procedure and the associated risks and benefits. Patient agrees to proceed with above surgery. Given the patients comorbidities, *he will need *** as well as a PAT appt.    I spent a total of *** minutes in both face-to-face and non-face-to-face activities for this visit on the date of this encounter including ***  Geronimo Boot PA-C Neurosurgery

## 2022-05-15 ENCOUNTER — Ambulatory Visit: Payer: BC Managed Care – PPO | Admitting: Orthopedic Surgery

## 2022-06-24 ENCOUNTER — Telehealth: Payer: Self-pay

## 2022-06-24 DIAGNOSIS — G8929 Other chronic pain: Secondary | ICD-10-CM

## 2022-06-24 MED ORDER — METHOCARBAMOL 500 MG PO TABS: 500.0000 mg | ORAL_TABLET | Freq: Three times a day (TID) | ORAL | 0 refills | Status: DC | PRN

## 2022-06-24 NOTE — Telephone Encounter (Signed)
-----   Message from Peggyann Shoals sent at 06/24/2022 10:28 AM EST ----- Regarding: medication Contact: [phone] Patient seen Adriana Lewis on 7/28 for back tightness. She did not show for her appt in September with Marzetta Board because she was out of town and feeling better. This morning she woke up with pain 7 out of 10. Is there a muscle relaxer that can be sent in to [facility]. [facility]?

## 2022-06-24 NOTE — Telephone Encounter (Signed)
Can do limited script for robaxin. She should schedule f/u with me/Danielle.

## 2022-06-25 NOTE — Telephone Encounter (Signed)
Spoke to the Patient, appt scheduled for 07/25/22

## 2022-06-25 NOTE — Telephone Encounter (Signed)
Robaxin sent in. Kennyth Arnold would like her to make a follow up appt.

## 2022-06-25 NOTE — Telephone Encounter (Signed)
Left message to call back  

## 2022-07-23 NOTE — Progress Notes (Signed)
Referring Physician:  Judie Petit, MD [address] [place],  [place] [address]  Primary Physician:  Judie Petit, MD  History of Present Illness: [name] has a history of left L4-L5 microdiscectomy on 01/12/20 by Dr. Myer Haff.   She was last seen by Duwayne Heck for a phone visit on 04/09/22 to review her lumbar MRI scan. MRI findings did not explain her symptoms. Danielle recommended evaluation with PMR for possible hip versus SI joint injection. She also recommended PT (patient declined).   She had left SI joint injection by Dr. Yves Dill on 04/23/22.   She is here for follow up.   She called a few weeks ago with increased pain low back and left leg pain. This has resolved after a few days. She has only occasional pain in left hip, but overall is doing well. She does note some increased weakness in her left leg- she's had this since surgery but thinks it may be worse. No numbness or tingling. She takes robaxin prn with relief.   No bowel or bladder issues.   Conservative measures:  Physical therapy: Home exercises Multimodal medical therapy including regular antiinflammatories: Tylenol with some mild improvement Injections:  Left SI joint injection 04/23/22 ([facility])   Past Surgery: Left L4-5 microdiscectomy on 01/11/20 by Dr. Myer Haff.    Review of Systems:  A 10 point review of systems is negative, except for the pertinent positives and negatives detailed in the HPI.  Past Medical History: Past Medical History:  Diagnosis Date   Anxiety    Irregular heartbeat    pt reports history of palpitations    Past Surgical History: Past Surgical History:  Procedure Laterality Date   DILATION AND EVACUATION N/A 02/02/2017   Procedure: DILATATION AND EVACUATION;  Surgeon: Linzie Collin, MD;  Location: [facility] ORS;  Service: Gynecology;  Laterality: N/A;   LUMBAR LAMINECTOMY/DECOMPRESSION MICRODISCECTOMY Left 01/11/2020   Procedure: LEFT L4-5 MICRODISCECTOMY;   Surgeon: Venetia Night, MD;  Location: [facility] ORS;  Service: Neurosurgery;  Laterality: Left;   TONSILLECTOMY  01/07/2022   UPPER GI ENDOSCOPY     WISDOM TOOTH EXTRACTION      Allergies: Allergies as of 07/25/2022 - Review Complete 07/25/2022  Allergen Reaction Noted   Latex Hives and Rash 02/01/2015   Sulfa antibiotics Hives and Rash 09/07/2013    Medications: Outpatient Encounter Medications as of 07/25/2022  Medication Sig   FLUOXETINE HCL PO Take 80 mg by mouth daily.   propranolol (INDERAL) 10 MG tablet Take 10 mg by mouth as needed.   [DISCONTINUED] acetaminophen (TYLENOL) 325 MG tablet Take 2 tablets (650 mg total) by mouth every 4 (four) hours as needed for mild pain ((score 1 to 3) or temp > 100.5).   [DISCONTINUED] cetirizine (ZYRTEC ALLERGY) 10 MG tablet Take 1 tablet (10 mg total) by mouth daily for 10 days.   [DISCONTINUED] dicyclomine (BENTYL) 10 MG capsule Take 1 capsule (10 mg total) by mouth 3 (three) times daily as needed (abdominal pain).   [DISCONTINUED] methocarbamol (ROBAXIN) 500 MG tablet Take 1 tablet (500 mg total) by mouth every 8 (eight) hours as needed for muscle spasms. (Patient not taking: Reported on 07/25/2022)   [DISCONTINUED] ondansetron (ZOFRAN) 4 MG tablet Take 1 tablet (4 mg total) by mouth every 8 (eight) hours as needed for vomiting or nausea. (Patient not taking: Reported on 07/25/2022)   [DISCONTINUED] polyethylene glycol (MIRALAX / GLYCOLAX) 17 g packet Take 17 g by mouth daily as needed for mild constipation. (Patient  not taking: Reported on 07/25/2022)   [DISCONTINUED] Prenatal Vit-Fe Fumarate-FA (PNV PRENATAL PLUS MULTIVITAMIN) 27-1 MG TABS Take 1 tablet by mouth daily. (Patient not taking: Reported on 07/25/2022)   [DISCONTINUED] senna (SENOKOT) 8.6 MG TABS tablet Take 1 tablet (8.6 mg total) by mouth 2 (two) times daily. (Patient not taking: Reported on 07/25/2022)   [DISCONTINUED] sertraline (ZOLOFT) 25 MG tablet Take 75 mg by mouth daily.   (Patient not taking: Reported on 07/25/2022)   [DISCONTINUED] triamcinolone cream (KENALOG) 0.1 % Apply 1 Application topically 2 (two) times daily as needed. (Patient not taking: Reported on 07/25/2022)   No facility-administered encounter medications on file as of 07/25/2022.    Social History: Social History   Tobacco Use   Smoking status: Never   Smokeless tobacco: Never  Vaping Use   Vaping Use: Never used  Substance Use Topics   Alcohol use: No    Comment: rarely 2-3 glasses of wine per year   Drug use: No    Family Medical History: Family History  Problem Relation Age of Onset   Diabetes Mother    Hypertension Mother    Varicose Veins Mother    Hepatitis B Father    Cirrhosis Father    Diabetes Sister    Hypertension Sister    Heart disease Maternal Grandmother    Cancer Paternal Grandmother        stomach   Diabetes Paternal Grandfather    COPD Paternal Grandfather     Physical Examination: Vitals:   07/25/22 0838  BP: 122/72      Awake, alert, oriented to person, place, and time.  Speech is clear and fluent. Fund of knowledge is appropriate.   Cranial Nerves: Pupils equal round and reactive to light.    ROM of spine:  Limited ROM of lumbar spine with mild pain  No abnormal lesions on exposed skin.   Incision is well healed. Mild lower lumbar tenderness. Mild tenderness over left SI joint.   Strength:  Side Iliopsoas Quads Hamstring PF DF EHL  R 5 5 5 5 5 5   L 5 5 5 5  +4 +4   Reflexes are 2+ and symmetric at the patella and achilles.   Hoffman's is absent.  Clonus is not present.   Bilateral lower extremity sensation is intact to light touch.     Gait is normal.   She can heel and toe walk.   Medical Decision Making  Imaging: Lumbar MRI dated 04/02/22:  FINDINGS: Segmentation:  Standard.   Alignment:  Physiologic.   Vertebrae:  No fracture, evidence of discitis, or bone lesion.   Conus medullaris and cauda equina: Conus extends to the  L1-L2 level. Conus and cauda equina appear normal.   Paraspinal and other soft tissues: 1.7 cm right renal simple cyst. No follow-up imaging is recommended. Otherwise negative.   Disc levels:   T11-T12: Moderate left subarticular disc protrusion with flattening of the left ventral cord. No stenosis.   T12-L1:  Minimal disc bulging.  No stenosis.   L1-L2:  Minimal disc bulging.  No stenosis.   L2-L3:  Minimal disc bulging.  No stenosis.   L3-L4:  Minimal disc bulging.  No stenosis.   L4-L5: Prior left hemilaminectomy. Minimal disc bulging with central annular fissure. Mild bilateral lateral recess stenosis. No spinal canal or neuroforaminal stenosis.   L5-S1: Negative disc. Mild bilateral facet arthropathy. No stenosis.   IMPRESSION: 1. Mild degenerative changes of the lumbar spine as described above. Mild bilateral lateral recess  stenosis at L4-L5. 2. Moderate left subarticular disc protrusion at T11-T12 with flattening of the left ventral cord. No stenosis.     Electronically Signed   By: Obie Dredge M.D.   On: 04/04/2022 12:27  I have personally reviewed the images and agree with the above interpretation.  Assessment and Plan: Ms. Mould is a pleasant 26 y.o. female with history of left L4-L5 microdiscectomy on 01/12/20 by Dr. Myer Haff.   She called a few weeks ago with increased pain low back and left leg pain. This has resolved after a few days. She has only occasional pain in left hip, but overall is doing well. She does note some increased weakness in her left leg- she's had this since surgery but thinks it may be worse.   Previous lumbar MRI on 04/02/22 looked good.   Treatment options discussed with patient and following plan made:   - Order for physical therapy for lumbar spine at [facility]. - Okay to continue prn robaxin. Call if she needs refill.  - Can call for dose pack if she has flare up of back/leg pain again.  - Will follow slight weakness in left  lower extremity. If this does not improve, may consider updated imaging (?repreat MRI). As above MRI from 04/02/22 looked okay.  - She is working on weight loss and has lost 30 lbs! She will continue this.   I spent a total of 15 minutes in face-to-face and non-face-to-face activities related to this patient's care toda including review of outside records, review of imaging, review of symptoms, physical exam, discussion of differential diagnosis, discussion of treatment options, and documentation.   Drake Leach PA-C Dept. of Neurosurgery

## 2022-07-25 ENCOUNTER — Encounter: Payer: Self-pay | Admitting: Orthopedic Surgery

## 2022-07-25 ENCOUNTER — Ambulatory Visit (INDEPENDENT_AMBULATORY_CARE_PROVIDER_SITE_OTHER): Payer: BC Managed Care – PPO | Admitting: Orthopedic Surgery

## 2022-07-25 VITALS — BP 122/72 | Ht 68.0 in | Wt 254.0 lb

## 2022-07-25 DIAGNOSIS — M533 Sacrococcygeal disorders, not elsewhere classified: Secondary | ICD-10-CM

## 2022-07-25 DIAGNOSIS — M47816 Spondylosis without myelopathy or radiculopathy, lumbar region: Secondary | ICD-10-CM

## 2022-07-25 DIAGNOSIS — Z9889 Other specified postprocedural states: Secondary | ICD-10-CM

## 2022-07-25 NOTE — Patient Instructions (Signed)
It was so nice to see you today, I am glad you are feeling better.   I sent physical therapy orders to [facility], they should call you to schedule. You can call them at [phone] if you don't hear from them.   Okay to continue on methocarbamol as needed to help with muscle spasms. Be careful, this can make you sleepy.   I will see you back in 6 weeks. Please do not hesitate to call if you have any questions or concerns. You can also message me in MyChart.   If you have not heard back about any of the tests/procedures in the next week, please call the office so we can help you get these things scheduled.   Drake Leach PA-C [phone]

## 2022-08-27 NOTE — Progress Notes (Deleted)
Referring Physician:  Derenda Mis, MD [address] [place],  [place] [address]  Primary Physician:  Derenda Mis, MD  History of Present Illness: [name] has a history of left L4-L5 microdiscectomy on 01/12/20 by Dr. Izora Ribas.   Last seen by me on 07/25/22 and she was doing well with only occasional left hip pain. She thought that her left leg weakness may be worse (she's had since above surgery), but she wasn't sure. MRI of lumbar spine on 04/02/22 looked good.   She was sent to PT (did not go?***) and was to continue with weight loss. She is here for follow up.   Seen in ED on 08/30/22 for increased back and left leg pain. She was given toradol, robaxin, percocet, and prednisone from the ED.     Per Izora Ribas- She went to the ER on Saturday for back pain.  If she has any symptoms of thoracic myelopathy when you see her, she may need to have a thoracic mri given the T11-12 disc herniation. ***     She was last seen by Danielle for a phone visit on 04/09/22 to review her lumbar MRI scan. MRI findings did not explain her symptoms. Danielle recommended evaluation with PMR for possible hip versus SI joint injection. She also recommended PT (patient declined).   She had left SI joint injection by Dr. Sharlet Salina on 04/23/22.   She is here for follow up.   She called a few weeks ago with increased pain low back and left leg pain. This has resolved after a few days. She has only occasional pain in left hip, but overall is doing well. She does note some increased weakness in her left leg- she's had this since surgery but thinks it may be worse. No numbness or tingling. She takes robaxin prn with relief.   No bowel or bladder issues.   Conservative measures:  Physical therapy: Home exercises Multimodal medical therapy including regular antiinflammatories: Tylenol with some mild improvement Injections:  Left SI joint injection 04/23/22 ([facility])   Past Surgery: Left L4-5  microdiscectomy on 01/11/20 by Dr. Izora Ribas.    Review of Systems:  A 10 point review of systems is negative, except for the pertinent positives and negatives detailed in the HPI.  Past Medical History: Past Medical History:  Diagnosis Date   Anxiety    Irregular heartbeat    pt reports history of palpitations    Past Surgical History: Past Surgical History:  Procedure Laterality Date   DILATION AND EVACUATION N/A 02/02/2017   Procedure: DILATATION AND EVACUATION;  Surgeon: Harlin Heys, MD;  Location: [facility] ORS;  Service: Gynecology;  Laterality: N/A;   LUMBAR LAMINECTOMY/DECOMPRESSION MICRODISCECTOMY Left 01/11/2020   Procedure: LEFT L4-5 MICRODISCECTOMY;  Surgeon: Meade Maw, MD;  Location: [facility] ORS;  Service: Neurosurgery;  Laterality: Left;   TONSILLECTOMY  01/07/2022   UPPER GI ENDOSCOPY     WISDOM TOOTH EXTRACTION      Allergies: Allergies as of 09/05/2022 - Review Complete 07/25/2022  Allergen Reaction Noted   Latex Hives and Rash 02/01/2015   Sulfa antibiotics Hives and Rash 09/07/2013    Medications: Outpatient Encounter Medications as of 09/05/2022  Medication Sig   FLUOXETINE HCL PO Take 80 mg by mouth daily.   propranolol (INDERAL) 10 MG tablet Take 10 mg by mouth as needed.   No facility-administered encounter medications on file as of 09/05/2022.    Social History: Social History   Tobacco Use   Smoking  status: Never   Smokeless tobacco: Never  Vaping Use   Vaping Use: Never used  Substance Use Topics   Alcohol use: No    Comment: rarely 2-3 glasses of wine per year   Drug use: No    Family Medical History: Family History  Problem Relation Age of Onset   Diabetes Mother    Hypertension Mother    Varicose Veins Mother    Hepatitis B Father    Cirrhosis Father    Diabetes Sister    Hypertension Sister    Heart disease Maternal Grandmother    Cancer Paternal Grandmother        stomach   Diabetes Paternal Grandfather    COPD  Paternal Grandfather     Physical Examination: There were no vitals filed for this visit.     Awake, alert, oriented to person, place, and time.  Speech is clear and fluent. Fund of knowledge is appropriate.   Cranial Nerves: Pupils equal round and reactive to light.    ROM of spine:  Limited ROM of lumbar spine with mild pain  No abnormal lesions on exposed skin.   Incision is well healed. Mild lower lumbar tenderness. Mild tenderness over left SI joint.   Strength:  Side Iliopsoas Quads Hamstring PF DF EHL  R 5 5 5 5 5 5  $ L 5 5 5 5 $ +4*** +4   Reflexes are 2+ and symmetric at the patella and achilles.   Hoffman's is absent.  Clonus is not present.   Bilateral lower extremity sensation is intact to light touch.     Gait is normal.   She can heel and toe walk.   Medical Decision Making  Imaging: Lumbar MRI dated 08/30/22:  FINDINGS: Segmentation: Standard. Lowest well-formed disc space labeled the L5-S1 level.   Alignment:  Mild levoscoliosis.  No listhesis.   Vertebrae: Vertebral body height maintained without acute or chronic fracture. Bone marrow signal intensity somewhat diffusely decreased on T1 weighted imaging, nonspecific, but most commonly related to anemia, smoking or obesity. No discrete or worrisome osseous lesions. Mild reactive endplate change present about the L4-5 interspace. No other abnormal marrow edema.   Conus medullaris and cauda equina: Conus extends to the L1-2 level. Conus and cauda equina appear normal.   Paraspinal and other soft tissues: Paraspinous soft tissues within normal limits. 1.9 cm simple right renal cyst noted, benign in appearance, no follow-up imaging recommended.   Disc levels:   T11-12: Seen only on sagittal projection. Left paracentral disc protrusion indents the ventral thecal sac (series 5, image 8). Mild cord flattening without definite spinal stenosis. Partially visualized foramina appear patent.   T12-L1:  Unremarkable.   L1-2:  Unremarkable.   L2-3: Minimal annular disc bulge. No spinal stenosis. Foramina remain patent.   L3-4: Normal interspace. Minimal facet spurring. No spinal stenosis. Foramina remain patent.   L4-5: Mild diffuse disc bulge with disc desiccation. Prior left hemi laminectomy. Small central disc protrusion minimally indents the ventral thecal sac. Mild to moderate right with mild left facet hypertrophy. No residual spinal stenosis. Lateral recesses appear patent. Borderline mild bilateral foraminal stenosis.   L5-S1: Normal interspace. Mild bilateral facet hypertrophy. No canal or foraminal stenosis.   IMPRESSION: 1. No acute abnormality within the lumbar spine. No evidence for cord compression. 2. Prior left hemi laminectomy at L4-5. Small residual and/or recurrent central disc protrusion at this level without significant stenosis or neural impingement. 3. Left paracentral disc protrusion at T11-12 with mild cord flattening,  but no definite significant spinal stenosis. 4. Mild to moderate bilateral facet hypertrophy at L3-4 through L5-S1.     Electronically Signed   By: Jeannine Boga M.D.   On: 08/30/2022 23:26   I have personally reviewed the images and agree with the above interpretation.   Assessment and Plan: Ms. Shircliff is a pleasant 27 y.o. female with history of left L4-L5 microdiscectomy on 01/12/20 by Dr. Izora Ribas.   She called a few weeks ago with increased pain low back and left leg pain. This has resolved after a few days. She has only occasional pain in left hip, but overall is doing well. She does note some increased weakness in her left leg- she's had this since surgery but thinks it may be worse.   Previous lumbar MRI on 04/02/22 looked good.   Treatment options discussed with patient and following plan made:   - Order for physical therapy for lumbar spine at [place] to continue prn robaxin. Call if she needs refill.  -  Can call for dose pack if she has flare up of back/leg pain again.  - Will follow slight weakness in left lower extremity. If this does not improve, may consider updated imaging (?repreat MRI). As above MRI from 04/02/22 looked okay.  - She is working on weight loss and has lost 30 lbs! She will continue this.   I spent a total of *** minutes in face-to-face and non-face-to-face activities related to this patient's care toda including review of outside records, review of imaging, review of symptoms, physical exam, discussion of differential diagnosis, discussion of treatment options, and documentation.   Geronimo Boot PA-C Dept. of Neurosurgery

## 2022-08-30 ENCOUNTER — Encounter: Payer: Self-pay | Admitting: Radiology

## 2022-08-30 ENCOUNTER — Other Ambulatory Visit: Payer: Self-pay

## 2022-08-30 ENCOUNTER — Emergency Department
Admission: EM | Admit: 2022-08-30 | Discharge: 2022-08-31 | Disposition: A | Discharge status: Home / Self Care | Payer: BC Managed Care – PPO | Attending: Emergency Medicine | Admitting: Emergency Medicine

## 2022-08-30 ENCOUNTER — Emergency Department: Payer: BC Managed Care – PPO

## 2022-08-30 DIAGNOSIS — M5126 Other intervertebral disc displacement, lumbar region: Secondary | ICD-10-CM | POA: Insufficient documentation

## 2022-08-30 DIAGNOSIS — M5136 Other intervertebral disc degeneration, lumbar region: Secondary | ICD-10-CM

## 2022-08-30 DIAGNOSIS — M545 Low back pain, unspecified: Secondary | ICD-10-CM | POA: Diagnosis present

## 2022-08-30 MED ORDER — MORPHINE SULFATE (PF) 4 MG/ML IV SOLN
4.0000 mg | Freq: Once | INTRAVENOUS | Status: AC
  Administered 2022-08-30: 4 mg via INTRAVENOUS
  Filled 2022-08-30: qty 1

## 2022-08-30 MED ORDER — ONDANSETRON HCL 4 MG/2ML IJ SOLN
4.0000 mg | Freq: Once | INTRAMUSCULAR | Status: AC
  Administered 2022-08-30: 4 mg via INTRAVENOUS
  Filled 2022-08-30: qty 2

## 2022-08-30 NOTE — ED Provider Notes (Signed)
[facility] Provider Note  Patient Contact: 10:08 PM (approximate)   History   Back Pain   HPI  Adriana Lewis is a 27 y.o. female who presents emergency department with sharp low back pain with radicular symptoms down the left leg.  Patient has a history of microdiscectomy 3 years ago.  Patient woke up with pain with increasing symptoms in the low back and not on the left leg.  She has weakness of the left lower extremity.  She has incomplete bladder emptying.  No difficulty with bowels.  No urinary changes.  No direct trauma to the lumbar spine.  Sensation intact but decreased to the left lower extremity.     Physical Exam   Triage Vital Signs: ED Triage Vitals  Enc Vitals Group     BP 08/30/22 2113 132/71     Pulse Rate 08/30/22 2113 100     Resp 08/30/22 2113 18     Temp 08/30/22 2113 97.9 F (36.6 C)     Temp Source 08/30/22 2113 Oral     SpO2 08/30/22 2113 97 %     Weight 08/30/22 2109 253 lb 8.5 oz (115 kg)     Height 08/30/22 2109 5\' 7"  (1.702 m)     Head Circumference --      Peak Flow --      Pain Score 08/30/22 2109 8     Pain Loc --      Pain Edu? --      Excl. in [place]? --     Most recent vital signs: Vitals:   08/30/22 2113 08/30/22 2113  BP:  132/71  Pulse: 100   Resp:  18  Temp: 97.9 F (36.6 C)   SpO2: 97%      General: Alert and in no acute distress  Cardiovascular:  Good peripheral perfusion Respiratory: Normal respiratory effort without tachypnea or retractions. Lungs CTAB.  Musculoskeletal: Full range of motion to all extremities.  Significant pain with range of motion of the lumbar spine.  Tender throughout the lumbar.  Weakness of the left lower extremity especially with plantarflexion.  Sensation appreciated with decreased when compared to the right side.  Pulses intact. Neurologic:  No gross focal neurologic deficits are appreciated.  Skin:   No rash noted Other:   ED Results / Procedures / Treatments    Labs (all labs ordered are listed, but only abnormal results are displayed) Labs Reviewed - No data to display   EKG  ***   RADIOLOGY  {**I personally viewed, evaluated, and interpreted these images as part of my medical decision making, as well as reviewing the written report by the radiologist.  ED Provider Interpretation: **}  No results found.  PROCEDURES:  Critical Care performed: {CriticalCareYesNo:19197::"Yes, see critical care procedure note(s)","No"}  Procedures   MEDICATIONS ORDERED IN ED: Medications - No data to display   IMPRESSION / MDM / [facility] / ED COURSE  I reviewed the triage vital signs and the nursing notes.                                 Differential diagnosis includes, but is not limited to, ***  {**The patient is on the cardiac monitor to evaluate for evidence of arrhythmia and/or significant heart rate changes.**}  Patient's presentation is most consistent with {EM COPA:27473}   Patient's diagnosis is consistent with ***. Patient will be discharged home with  prescriptions for ***. Patient is to follow up with *** as needed or otherwise directed. Patient is given ED precautions to return to the ED for any worsening or new symptoms.     FINAL CLINICAL IMPRESSION(S) / ED DIAGNOSES   Final diagnoses:  None     Rx / DC Orders   ED Discharge Orders     None        Note:  This document was prepared using Dragon voice recognition software and may include unintentional dictation errors.

## 2022-08-30 NOTE — ED Triage Notes (Signed)
First RN Note: pt to ED via POV. Pt states had microdiscectomy in 2021 with Dr. Cari Caraway between L4-L5. Pt states awoke today with symptoms that she had prior to surgery. Pt states severe lower back pain, and numbness/tingling to L leg. Pt states she has taken gabapentin, methocarbamol, and percocet as prescribed PTA without relief.

## 2022-08-30 NOTE — ED Triage Notes (Signed)
Pt to ED for back pain. Pt has previous HX of same and had a surgery for same in 2021. Pt said she woke up this morning with some pain but it has progressed throughout the day. Pt still has bowel control but had trouble emptying her bladder today. Pt is CAOx4 and in no acute distress at this time. Pt wheelchair currently, pain when she walks.

## 2022-08-30 NOTE — ED Provider Notes (Incomplete)
[facility] Provider Note  Patient Contact: 10:08 PM (approximate)   History   Back Pain   HPI  Adriana Lewis is a 27 y.o. female who presents emergency department with sharp low back pain with radicular symptoms down the left leg.  Patient has a history of microdiscectomy 3 years ago.  Patient woke up with pain with increasing symptoms in the low back and not on the left leg.  She has weakness of the left lower extremity.  She has incomplete bladder emptying.  No difficulty with bowels.  No urinary changes.  No direct trauma to the lumbar spine.  Sensation intact but decreased to the left lower extremity.     Physical Exam   Triage Vital Signs: ED Triage Vitals  Enc Vitals Group     BP 08/30/22 2113 132/71     Pulse Rate 08/30/22 2113 100     Resp 08/30/22 2113 18     Temp 08/30/22 2113 97.9 F (36.6 C)     Temp Source 08/30/22 2113 Oral     SpO2 08/30/22 2113 97 %     Weight 08/30/22 2109 253 lb 8.5 oz (115 kg)     Height 08/30/22 2109 5\' 7"  (1.702 m)     Head Circumference --      Peak Flow --      Pain Score 08/30/22 2109 8     Pain Loc --      Pain Edu? --      Excl. in GC? --     Most recent vital signs: Vitals:   08/30/22 2113 08/30/22 2113  BP:  132/71  Pulse: 100   Resp:  18  Temp: 97.9 F (36.6 C)   SpO2: 97%      General: Alert and in no acute distress  Cardiovascular:  Good peripheral perfusion Respiratory: Normal respiratory effort without tachypnea or retractions. Lungs CTAB.  Musculoskeletal: Full range of motion to all extremities.  Significant pain with range of motion of the lumbar spine.  Tender throughout the lumbar.  Weakness of the left lower extremity especially with plantarflexion.  Sensation appreciated with decreased when compared to the right side.  Pulses intact. Neurologic:  No gross focal neurologic deficits are appreciated.  Skin:   No rash noted Other:   ED Results / Procedures / Treatments    Labs (all labs ordered are listed, but only abnormal results are displayed) Labs Reviewed - No data to display   EKG     RADIOLOGY  I personally viewed, evaluated, and interpreted these images as part of my medical decision making, as well as reviewing the written report by the radiologist.  ED Provider Interpretation: MRI reveals ongoing bulging disc.  There is no central cord compression.  MR LUMBAR SPINE WO CONTRAST  Result Date: 08/30/2022 CLINICAL DATA:  Initial evaluation for low back pain, cauda equina syndrome suspected. EXAM: MRI LUMBAR SPINE WITHOUT CONTRAST TECHNIQUE: Multiplanar, multisequence MR imaging of the lumbar spine was performed. No intravenous contrast was administered. COMPARISON:  Prior study from 04/02/2022. FINDINGS: Segmentation: Standard. Lowest well-formed disc space labeled the L5-S1 level. Alignment:  Mild levoscoliosis.  No listhesis. Vertebrae: Vertebral body height maintained without acute or chronic fracture. Bone marrow signal intensity somewhat diffusely decreased on T1 weighted imaging, nonspecific, but most commonly related to anemia, smoking or obesity. No discrete or worrisome osseous lesions. Mild reactive endplate change present about the L4-5 interspace. No other abnormal marrow edema. Conus medullaris and cauda equina:  Conus extends to the L1-2 level. Conus and cauda equina appear normal. Paraspinal and other soft tissues: Paraspinous soft tissues within normal limits. 1.9 cm simple right renal cyst noted, benign in appearance, no follow-up imaging recommended. Disc levels: T11-12: Seen only on sagittal projection. Left paracentral disc protrusion indents the ventral thecal sac (series 5, image 8). Mild cord flattening without definite spinal stenosis. Partially visualized foramina appear patent. T12-L1: Unremarkable. L1-2:  Unremarkable. L2-3: Minimal annular disc bulge. No spinal stenosis. Foramina remain patent. L3-4: Normal interspace. Minimal  facet spurring. No spinal stenosis. Foramina remain patent. L4-5: Mild diffuse disc bulge with disc desiccation. Prior left hemi laminectomy. Small central disc protrusion minimally indents the ventral thecal sac. Mild to moderate right with mild left facet hypertrophy. No residual spinal stenosis. Lateral recesses appear patent. Borderline mild bilateral foraminal stenosis. L5-S1: Normal interspace. Mild bilateral facet hypertrophy. No canal or foraminal stenosis. IMPRESSION: 1. No acute abnormality within the lumbar spine. No evidence for cord compression. 2. Prior left hemi laminectomy at L4-5. Small residual and/or recurrent central disc protrusion at this level without significant stenosis or neural impingement. 3. Left paracentral disc protrusion at T11-12 with mild cord flattening, but no definite significant spinal stenosis. 4. Mild to moderate bilateral facet hypertrophy at L3-4 through L5-S1. Electronically Signed   By: Jeannine Boga M.D.   On: 08/30/2022 23:26    PROCEDURES:  Critical Care performed: No  Procedures   MEDICATIONS ORDERED IN ED: Medications  ketorolac (TORADOL) 30 MG/ML injection 30 mg (has no administration in time range)  methocarbamol (ROBAXIN) tablet 1,000 mg (has no administration in time range)  dexamethasone (DECADRON) injection 10 mg (has no administration in time range)  oxyCODONE-acetaminophen (PERCOCET/ROXICET) 5-325 MG per tablet 1 tablet (has no administration in time range)  ondansetron (ZOFRAN) injection 4 mg (4 mg Intravenous Given 08/30/22 2220)  morphine (PF) 4 MG/ML injection 4 mg (4 mg Intravenous Given 08/30/22 2220)     IMPRESSION / MDM / ASSESSMENT AND PLAN / ED COURSE  I reviewed the triage vital signs and the nursing notes.                                 Differential diagnosis includes, but is not limited to, herniated disc, bulging disc, sciatica, cauda equina  Patient's presentation is most consistent with acute presentation with  potential threat to life or bodily function.   Patient's diagnosis is consistent with herniated disc.  Patient presented to the emergency department complaining of low back pain rating down the left leg.  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  Patient has had no recent injuries.  No bowel or bladder dysfunction, saddle anesthesia or paresthesias.  Patient had MRI which revealed bulging disc without central cord compression.  No evidence of cauda equina.  Patient is given pain medication, steroids, anti-inflammatory and muscle relaxer.  Patient will be prescribed the same at home.  Concerning signs and symptoms to return emergently to the emergency department are discussed with the patient.  Otherwise follow-up with her neurosurgeon for further management of this ongoing issue.  Patient verbalizes understanding and agreement with the plan..  Patient is given ED precautions to return to the ED for any worsening or new symptoms.     FINAL CLINICAL IMPRESSION(S) / ED DIAGNOSES   Final diagnoses:  Bulging lumbar disc     Rx / DC Orders  ED Discharge Orders     None        Note:  This document was prepared using Dragon voice recognition software and may include unintentional dictation errors.

## 2022-08-31 MED ORDER — OXYCODONE-ACETAMINOPHEN 5-325 MG PO TABS
1.0000 | ORAL_TABLET | Freq: Once | ORAL | Status: AC
  Administered 2022-08-31: 1 via ORAL
  Filled 2022-08-31: qty 1

## 2022-08-31 MED ORDER — OXYCODONE-ACETAMINOPHEN 5-325 MG PO TABS: 1.0000 | ORAL_TABLET | Freq: Four times a day (QID) | ORAL | 0 refills | Status: DC | PRN

## 2022-08-31 MED ORDER — KETOROLAC TROMETHAMINE 30 MG/ML IJ SOLN
30.0000 mg | Freq: Once | INTRAMUSCULAR | Status: AC
  Administered 2022-08-31: 30 mg via INTRAVENOUS
  Filled 2022-08-31: qty 1

## 2022-08-31 MED ORDER — METHOCARBAMOL 500 MG PO TABS
1000.0000 mg | ORAL_TABLET | Freq: Once | ORAL | Status: AC
  Administered 2022-08-31: 1000 mg via ORAL
  Filled 2022-08-31: qty 2

## 2022-08-31 MED ORDER — PREDNISONE 10 MG PO TABS: 10.0000 mg | ORAL_TABLET | ORAL | 0 refills | Status: DC

## 2022-08-31 MED ORDER — KETOROLAC TROMETHAMINE 10 MG PO TABS: 10.0000 mg | ORAL_TABLET | Freq: Four times a day (QID) | ORAL | 0 refills | Status: DC | PRN

## 2022-08-31 MED ORDER — DEXAMETHASONE SODIUM PHOSPHATE 10 MG/ML IJ SOLN
10.0000 mg | Freq: Once | INTRAMUSCULAR | Status: AC
  Administered 2022-08-31: 10 mg via INTRAVENOUS
  Filled 2022-08-31: qty 1

## 2022-08-31 MED ORDER — METHOCARBAMOL 500 MG PO TABS: 500.0000 mg | ORAL_TABLET | Freq: Four times a day (QID) | ORAL | 1 refills | Status: DC

## 2022-09-01 ENCOUNTER — Telehealth: Payer: Self-pay | Admitting: Orthopedic Surgery

## 2022-09-01 NOTE — Telephone Encounter (Signed)
See below. Will discuss at her appointment on Friday.    Meade Maw, MD  Geronimo Boot, PA-C Hey-  She went to the ER on Saturday for back pain.  If she has any symptoms of thoracic myelopathy when you see her, she may need to have a thoracic mri given the T11-12 disc herniation.  [employer]

## 2022-09-02 NOTE — Progress Notes (Addendum)
Referring Physician:  No referring provider defined for this encounter.  Primary Physician:  Derenda Mis, MD  History of Present Illness: [name] has a history of left L4-L5 microdiscectomy on 01/12/20 by Dr. Izora Ribas.   Last seen by me on 07/25/22 and she was doing well with only occasional left hip pain. She thought that her left leg weakness may be worse (she's had since above surgery), but she wasn't sure. MRI of lumbar spine on 04/02/22 looked good.   She was sent to PT (was not called to schedule) and was to continue with weight loss.   Seen in ED on 08/30/22 for increased back and left leg pain. She was given toradol, robaxin, percocet, and prednisone from the ED. Not having any GI upset with these medications.   She has constant LBP with constant left leg pain to her foot (entire leg). She has numbness/tingling in left leg along with weakness. Has trouble lifting leg to get into tub to shower.   No neck or arm pain. No numbness, tingling, or weakness.   She notes some balance issues. No significant dexterity issues.   She has seen improvement with above medications- she states she was barely able to walk.   No bowel or bladder issues.   Conservative measures:  Physical therapy: Home exercises, nothing recent Multimodal medical therapy including regular antiinflammatories: Tylenol with some mild improvement, prednisone, toradol, robaxin, percocet.  Injections:  Left SI joint injection 04/23/22 ([facility])   Past Surgery: Left L4-5 microdiscectomy on 01/11/20 by Dr. Izora Ribas.    Review of Systems:  A 10 point review of systems is negative, except for the pertinent positives and negatives detailed in the HPI.  Past Medical History: Past Medical History:  Diagnosis Date   Anxiety    Irregular heartbeat    pt reports history of palpitations    Past Surgical History: Past Surgical History:  Procedure Laterality Date   DILATION AND EVACUATION N/A  02/02/2017   Procedure: DILATATION AND EVACUATION;  Surgeon: Harlin Heys, MD;  Location: [facility] ORS;  Service: Gynecology;  Laterality: N/A;   LUMBAR LAMINECTOMY/DECOMPRESSION MICRODISCECTOMY Left 01/11/2020   Procedure: LEFT L4-5 MICRODISCECTOMY;  Surgeon: Meade Maw, MD;  Location: [facility] ORS;  Service: Neurosurgery;  Laterality: Left;   TONSILLECTOMY  01/07/2022   UPPER GI ENDOSCOPY     WISDOM TOOTH EXTRACTION      Allergies: Allergies as of 09/03/2022 - Review Complete 08/30/2022  Allergen Reaction Noted   Latex Hives and Rash 02/01/2015   Sulfa antibiotics Hives and Rash 09/07/2013    Medications: Outpatient Encounter Medications as of 09/03/2022  Medication Sig   FLUOXETINE HCL PO Take 80 mg by mouth daily.   ketorolac (TORADOL) 10 MG tablet Take 1 tablet (10 mg total) by mouth every 6 (six) hours as needed.   methocarbamol (ROBAXIN) 500 MG tablet Take 1 tablet (500 mg total) by mouth 4 (four) times daily.   oxyCODONE-acetaminophen (PERCOCET/ROXICET) 5-325 MG tablet Take 1 tablet by mouth every 6 (six) hours as needed for severe pain.   predniSONE (DELTASONE) 10 MG tablet Take 1 tablet (10 mg total) by mouth as directed.   propranolol (INDERAL) 10 MG tablet Take 10 mg by mouth as needed.   No facility-administered encounter medications on file as of 09/03/2022.    Social History: Social History   Tobacco Use   Smoking status: Never   Smokeless tobacco: Never  Vaping Use   Vaping Use: Never used  Substance Use Topics  Alcohol use: No    Comment: rarely 2-3 glasses of wine per year   Drug use: No    Family Medical History: Family History  Problem Relation Age of Onset   Diabetes Mother    Hypertension Mother    Varicose Veins Mother    Hepatitis B Father    Cirrhosis Father    Diabetes Sister    Hypertension Sister    Heart disease Maternal Grandmother    Cancer Paternal Grandmother        stomach   Diabetes Paternal Grandfather    COPD Paternal  Grandfather     Physical Examination: There were no vitals filed for this visit.  Awake, alert, oriented to person, place, and time.  Speech is clear and fluent. Fund of knowledge is appropriate.   Cranial Nerves: Pupils equal round and reactive to light. Facial tone is symmetric.  Facial sensation is symmetric. Tongue is midline.   No lower lumbar tenderness.   No abnormal lesions on exposed skin.   Strength: Side Biceps Triceps Deltoid Interossei Grip Wrist Ext. Wrist Flex.  R 5 5 5 5 5 5 5   L +4 +4 +4 +4 +4 +4 +4    Side Iliopsoas Quads Hamstring PF DF EHL  R 5 5 5 5 5 5   L 5 5 5 5  +4 +4   Reflexes are 2+ and symmetric at the biceps, triceps, brachioradialis, patella and achilles.   Hoffman's is absent.   Bilateral upper and lower extremity sensation is intact to light touch, but diminished in left LE (entire) compared to right.   Gait is slow and she limps favoring left side.   Medical Decision Making  Imaging: Lumbar MRI dated 08/30/22:  FINDINGS: Segmentation: Standard. Lowest well-formed disc space labeled the L5-S1 level.   Alignment:  Mild levoscoliosis.  No listhesis.   Vertebrae: Vertebral body height maintained without acute or chronic fracture. Bone marrow signal intensity somewhat diffusely decreased on T1 weighted imaging, nonspecific, but most commonly related to anemia, smoking or obesity. No discrete or worrisome osseous lesions. Mild reactive endplate change present about the L4-5 interspace. No other abnormal marrow edema.   Conus medullaris and cauda equina: Conus extends to the L1-2 level. Conus and cauda equina appear normal.   Paraspinal and other soft tissues: Paraspinous soft tissues within normal limits. 1.9 cm simple right renal cyst noted, benign in appearance, no follow-up imaging recommended.   Disc levels:   T11-12: Seen only on sagittal projection. Left paracentral disc protrusion indents the ventral thecal sac (series 5, image  8). Mild cord flattening without definite spinal stenosis. Partially visualized foramina appear patent.   T12-L1: Unremarkable.   L1-2:  Unremarkable.   L2-3: Minimal annular disc bulge. No spinal stenosis. Foramina remain patent.   L3-4: Normal interspace. Minimal facet spurring. No spinal stenosis. Foramina remain patent.   L4-5: Mild diffuse disc bulge with disc desiccation. Prior left hemi laminectomy. Small central disc protrusion minimally indents the ventral thecal sac. Mild to moderate right with mild left facet hypertrophy. No residual spinal stenosis. Lateral recesses appear patent. Borderline mild bilateral foraminal stenosis.   L5-S1: Normal interspace. Mild bilateral facet hypertrophy. No canal or foraminal stenosis.   IMPRESSION: 1. No acute abnormality within the lumbar spine. No evidence for cord compression. 2. Prior left hemi laminectomy at L4-5. Small residual and/or recurrent central disc protrusion at this level without significant stenosis or neural impingement. 3. Left paracentral disc protrusion at T11-12 with mild cord flattening, but no  definite significant spinal stenosis. 4. Mild to moderate bilateral facet hypertrophy at L3-4 through L5-S1.     Electronically Signed   By: Jeannine Boga M.D.   On: 08/30/2022 23:26   I have personally reviewed the images and agree with the above interpretation.   Assessment and Plan: Ms. Propst is a pleasant 27 y.o. female with history of left L4-L5 microdiscectomy on 01/12/20 by Dr. Izora Ribas.   She has constant LBP with constant left leg pain to her foot (entire leg). She has numbness/tingling in left leg along with weakness. Has trouble lifting leg to get into tub to shower.   No neck or arm pain. No numbness, tingling, or weakness.   Recent lumbar MRI shows central disc at L4-L5 with no compression noted. Also seen is left paracentral disc protrusion at T11-12 with mild cord flattening, but no  definite significant spinal stenosis.  She is having some balance issues and this may be explained by thoracic disc at T11-T12. Unsure of cause of left leg pain and left upper extremity weakness. She has not noticed any weakness in left arm with her ADLs.   Treatment options discussed with patient and following plan made:   - Will review with Dr. Izora Ribas and message her with further plan.  - Will likely consider MRI of thoracic spine to look further at disc at T11-T12. May consider cervical MRI as well.  - Continue current medications from ED.  - She asks about further lumbar ESIs with Dr. Sharlet Salina. This may be an option, but will need to get above imaging first.  - Hold on PT for now, may revisit.   I spent a total of 25 minutes in face-to-face and non-face-to-face activities related to this patient's care toda including review of outside records, review of imaging, review of symptoms, physical exam, discussion of differential diagnosis, discussion of treatment options, and documentation.   ADDENDUM 09/04/22:  Patient reviewed with Dr. Izora Ribas. He agrees with MRI of cervical and thoracic spine. Will message her and order these. Depending on that these MRIs show, may also consider referral to neurology.   Once I get the MRI results, will set up phone/in person visit depending on her preference.    Geronimo Boot PA-C Dept. of Neurosurgery

## 2022-09-03 ENCOUNTER — Ambulatory Visit (INDEPENDENT_AMBULATORY_CARE_PROVIDER_SITE_OTHER): Payer: BC Managed Care – PPO | Admitting: Orthopedic Surgery

## 2022-09-03 ENCOUNTER — Encounter: Payer: Self-pay | Admitting: Orthopedic Surgery

## 2022-09-03 VITALS — BP 115/56 | HR 91 | Ht 67.0 in | Wt 253.0 lb

## 2022-09-03 DIAGNOSIS — M47816 Spondylosis without myelopathy or radiculopathy, lumbar region: Secondary | ICD-10-CM

## 2022-09-03 DIAGNOSIS — R29898 Other symptoms and signs involving the musculoskeletal system: Secondary | ICD-10-CM

## 2022-09-04 NOTE — Addendum Note (Signed)
Addended byGeronimo Boot on: 09/04/2022 04:25 PM   Modules accepted: Orders

## 2022-09-05 ENCOUNTER — Ambulatory Visit: Payer: Self-pay | Admitting: Orthopedic Surgery

## 2022-09-05 DIAGNOSIS — Z9889 Other specified postprocedural states: Secondary | ICD-10-CM

## 2022-09-08 ENCOUNTER — Telehealth: Payer: Self-pay

## 2022-09-08 DIAGNOSIS — Z9889 Other specified postprocedural states: Secondary | ICD-10-CM

## 2022-09-08 DIAGNOSIS — R29898 Other symptoms and signs involving the musculoskeletal system: Secondary | ICD-10-CM

## 2022-09-08 MED ORDER — GABAPENTIN 300 MG PO CAPS: 300.0000 mg | ORAL_CAPSULE | Freq: Every day | ORAL | 0 refills | Status: DC

## 2022-09-08 NOTE — Addendum Note (Signed)
Addended byGeronimo Boot on: 09/08/2022 03:13 PM   Modules accepted: Orders

## 2022-09-08 NOTE — Telephone Encounter (Signed)
-----  Message from [place] sent at 09/08/2022  9:06 AM EST ----- Adriana Lewis last week and pain is worsening. She is finishing up on the medications she is currently on and wanted to know if there was something else she could try to help with the pain.  ketorolac (TORADOL) 10 MG tablet methocarbamol (ROBAXIN) 500 MG tablet oxyCODONE-acetaminophen (PERCOCET/ROXICET) 5-325 MG tablet predniSONE (DELTASONE) 10 MG tablet  Is active on MyChart.

## 2022-09-08 NOTE — Telephone Encounter (Signed)
MyChart message sent to patient. Consider adding neurontin.

## 2022-09-17 ENCOUNTER — Ambulatory Visit
Admission: RE | Admit: 2022-09-17 | Discharge: 2022-09-17 | Disposition: A | Discharge status: Home / Self Care | Payer: BC Managed Care – PPO | Source: Ambulatory Visit | Attending: Orthopedic Surgery | Admitting: Orthopedic Surgery

## 2022-09-17 ENCOUNTER — Encounter: Payer: Self-pay | Admitting: Orthopedic Surgery

## 2022-09-17 DIAGNOSIS — R29898 Other symptoms and signs involving the musculoskeletal system: Secondary | ICD-10-CM

## 2022-09-17 NOTE — Progress Notes (Signed)
Cervical and thoracic MRI dated 09/17/22:  FINDINGS: MRI CERVICAL SPINE FINDINGS   Alignment: Physiologic   Vertebrae: No fracture, evidence of discitis, or aggressive osseous lesion.   Cord: No abnormal cord signal.   Posterior Fossa, vertebral arteries, paraspinal tissues: Negative.   Disc levels: The craniocervical junction is unremarkable.   C2-C3: No significant spinal canal or neural foraminal narrowing.   C3-C4: No significant spinal canal or neural foraminal narrowing.   C4-C5: No significant spinal canal or neural foraminal narrowing.   C5-C6: Broad disc bulge with central disc protrusion. There is moderate spinal canal stenosis and indentation of the ventral cord. There is mild right neural foraminal narrowing. No left neural foraminal narrowing.   C6-C7: Minimal disc bulging. No significant spinal canal or neural foraminal stenosis. Tiny perineural cysts bilaterally.   C7-T1: No significant spinal canal or neural foraminal stenosis. Bilateral perineural cysts, left greater than right, along left measures up to 6 x 6 x 4 mm.   MRI THORACIC SPINE FINDINGS   Alignment:  Physiologic.   Vertebrae: No fracture, evidence of discitis, or aggressive osseous lesion.   Cord: No abnormal cord signal.   Paraspinal and other soft tissues: Negative.   Disc levels: Tiny right paracentral disc protrusions at T6-T7 and T7-T8 without significant spinal canal or neural foraminal stenosis. Central/left paracentral disc protrusion at T11-T12 results in left ventral thecal sac narrowing and slight ventral cord flattening and contact with left-sided descending nerve roots.   IMPRESSION: Cervical spine MRI:   Broad disc bulge with central disc protrusion at C5-C6 results in moderate spinal canal stenosis and indentation of the ventral cord. No abnormal cord signal. Mild right neural foraminal stenosis at this level.   Thoracic spine MRI:   Central/left paracentral disc  protrusion at T11-T12 results in left ventral thecal sac narrowing and cord flattening, and contact with left-sided descending nerve roots.     Electronically Signed   By: Maurine Simmering M.D.   On: 09/17/2022 13:59  I have personally reviewed the images and agree with the above interpretation.   Reviewed above MRIs with Dr. Izora Ribas. Do not see cause of her left leg weakness or arm weakness.   He recommends referral to neurology for further workup.

## 2022-09-17 NOTE — Progress Notes (Unsigned)
Telephone Visit- Progress Note: Referring Physician:  No referring provider defined for this encounter.  Primary Physician:  Derenda Mis, MD  This visit was performed via telephone.  Patient location: home Provider location: office  I spent a total of 15 minutes non-face-to-face activities for this visit on the date of this encounter including review of current clinical condition and response to treatment.    Patient has given verbal consent to this telephone visits and we reviewed the limitations of a telephone visit. Patient wishes to proceed.    Chief Complaint:  review cervical and thoracic MRI results  History of Present Illness: Adriana Lewis is a 27 y.o. female has a history of left L4-L5 microdiscectomy on 01/12/20 by Dr. Izora Ribas.   Last seen by me on 09/03/22 for follow up. She has known central disc at L4-L5 with no compression noted. Also seen is left paracentral disc protrusion at T11-12 with mild cord flattening, but no definite significant spinal stenosis.   She was having some weakness in left arm and left leg- MRI of thoracic and cervical spine ordered. Phone visit scheduled to review them.   She continues with constant LBP with constant left leg pain to her foot (entire leg). She also has developed right leg pain (entire leg) to below her knee.  She has numbness/tingling in both legs along with weakness, left > right.Has trouble lifting leg to get into tub to shower.   She is now noticing weakness in left > right arms. She has intermittent numbness and tingling in right hand. No neck or arm pain.   She continues on neurontin. Robaxin did not help. Minimal relief with previous steroid pack.     Conservative measures:  Physical therapy: Home exercises, nothing recent Multimodal medical therapy including regular antiinflammatories: Tylenol with some mild improvement, prednisone, toradol, robaxin, percocet.  Injections:  Left SI joint injection  04/23/22 ([facility])   Past Surgery: Left L4-5 microdiscectomy on 01/11/20 by Dr. Izora Ribas.   Exam: No exam done as this was a telephone encounter.     Imaging: Cervical and thoracic spine MRI dated 09/17/22:  FINDINGS: MRI CERVICAL SPINE FINDINGS   Alignment: Physiologic   Vertebrae: No fracture, evidence of discitis, or aggressive osseous lesion.   Cord: No abnormal cord signal.   Posterior Fossa, vertebral arteries, paraspinal tissues: Negative.   Disc levels: The craniocervical junction is unremarkable.   C2-C3: No significant spinal canal or neural foraminal narrowing.   C3-C4: No significant spinal canal or neural foraminal narrowing.   C4-C5: No significant spinal canal or neural foraminal narrowing.   C5-C6: Broad disc bulge with central disc protrusion. There is moderate spinal canal stenosis and indentation of the ventral cord. There is mild right neural foraminal narrowing. No left neural foraminal narrowing.   C6-C7: Minimal disc bulging. No significant spinal canal or neural foraminal stenosis. Tiny perineural cysts bilaterally.   C7-T1: No significant spinal canal or neural foraminal stenosis. Bilateral perineural cysts, left greater than right, along left measures up to 6 x 6 x 4 mm.   MRI THORACIC SPINE FINDINGS   Alignment:  Physiologic.   Vertebrae: No fracture, evidence of discitis, or aggressive osseous lesion.   Cord: No abnormal cord signal.   Paraspinal and other soft tissues: Negative.   Disc levels: Tiny right paracentral disc protrusions at T6-T7 and T7-T8 without significant spinal canal or neural foraminal stenosis. Central/left paracentral disc protrusion at T11-T12 results in left ventral thecal sac narrowing and slight ventral cord  flattening and contact with left-sided descending nerve roots.   IMPRESSION: Cervical spine MRI:   Broad disc bulge with central disc protrusion at C5-C6 results in moderate spinal canal stenosis and  indentation of the ventral cord. No abnormal cord signal. Mild right neural foraminal stenosis at this level.   Thoracic spine MRI:   Central/left paracentral disc protrusion at T11-T12 results in left ventral thecal sac narrowing and cord flattening, and contact with left-sided descending nerve roots.     Electronically Signed   By: Maurine Simmering M.D.   On: 09/17/2022 13:59  I have personally reviewed the images and agree with the above interpretation.  Assessment and Plan: [name] is a pleasant 27 y.o. female has a history of left L4-L5 microdiscectomy on 01/12/20 by Dr. Izora Ribas.   She continues with constant LBP with constant left leg pain to her foot (entire leg). Symptoms have progressed- she now has right leg pain (entire leg) to below her knee.  She has numbness/tingling in both legs along with weakness, left > right.  In addition, she is now noticing weakness in her left > right arms. She has intermittent numbness and tingling in right hand. No neck or arm pain.   Above cervical and thoracic MRIs reviewed with Dr. Izora Ribas. Do not see anything that would explain her arm or leg weakness.   Previous lumbar MRI did not show any cause of above symptoms as well.   Treatment options discussed with Dr. Izora Ribas. Options then discussed with patient and following plan made:   - Referral to neurology for further evaluation of progressive pain and weakness in left > right extremities. Message sent to Dr. Manuella Ghazi and his PA.  - Continue on current neurontin.  - Will message her in 4 weeks to check on her.   Geronimo Boot PA-C Neurosurgery

## 2022-09-18 ENCOUNTER — Encounter: Payer: Self-pay | Admitting: Orthopedic Surgery

## 2022-09-18 ENCOUNTER — Ambulatory Visit (INDEPENDENT_AMBULATORY_CARE_PROVIDER_SITE_OTHER): Payer: BC Managed Care – PPO | Admitting: Orthopedic Surgery

## 2022-09-18 DIAGNOSIS — M47812 Spondylosis without myelopathy or radiculopathy, cervical region: Secondary | ICD-10-CM | POA: Diagnosis not present

## 2022-09-18 DIAGNOSIS — R29898 Other symptoms and signs involving the musculoskeletal system: Secondary | ICD-10-CM

## 2022-09-18 DIAGNOSIS — M47816 Spondylosis without myelopathy or radiculopathy, lumbar region: Secondary | ICD-10-CM

## 2022-09-18 DIAGNOSIS — Z9889 Other specified postprocedural states: Secondary | ICD-10-CM | POA: Diagnosis not present

## 2022-09-25 ENCOUNTER — Other Ambulatory Visit: Payer: Self-pay | Admitting: Student

## 2022-09-25 DIAGNOSIS — R200 Anesthesia of skin: Secondary | ICD-10-CM

## 2022-10-09 ENCOUNTER — Other Ambulatory Visit: Payer: Self-pay | Admitting: Orthopedic Surgery

## 2022-10-09 DIAGNOSIS — R29898 Other symptoms and signs involving the musculoskeletal system: Secondary | ICD-10-CM

## 2022-10-09 DIAGNOSIS — Z9889 Other specified postprocedural states: Secondary | ICD-10-CM

## 2022-10-09 NOTE — Telephone Encounter (Signed)
Left message on pt's voice-mail.

## 2022-10-09 NOTE — Telephone Encounter (Signed)
Can refill neurontin this time, but recommend she get further refills from neurology as she is following up with them. Please let her know.

## 2022-11-02 ENCOUNTER — Encounter: Payer: Self-pay | Admitting: Neurosurgery

## 2022-11-02 ENCOUNTER — Other Ambulatory Visit: Payer: Self-pay

## 2022-11-02 ENCOUNTER — Inpatient Hospital Stay
Admission: EM | Admit: 2022-11-02 | Discharge: 2022-11-04 | DRG: 520 | Disposition: A | Discharge status: Home / Self Care | Payer: BC Managed Care – PPO | Attending: Neurosurgery | Admitting: Neurosurgery

## 2022-11-02 ENCOUNTER — Emergency Department: Payer: BC Managed Care – PPO

## 2022-11-02 DIAGNOSIS — Z825 Family history of asthma and other chronic lower respiratory diseases: Secondary | ICD-10-CM | POA: Diagnosis not present

## 2022-11-02 DIAGNOSIS — M21371 Foot drop, right foot: Secondary | ICD-10-CM

## 2022-11-02 DIAGNOSIS — E669 Obesity, unspecified: Secondary | ICD-10-CM | POA: Diagnosis present

## 2022-11-02 DIAGNOSIS — M5126 Other intervertebral disc displacement, lumbar region: Secondary | ICD-10-CM | POA: Diagnosis not present

## 2022-11-02 DIAGNOSIS — Z882 Allergy status to sulfonamides status: Secondary | ICD-10-CM

## 2022-11-02 DIAGNOSIS — Z6839 Body mass index (BMI) 39.0-39.9, adult: Secondary | ICD-10-CM | POA: Diagnosis not present

## 2022-11-02 DIAGNOSIS — F419 Anxiety disorder, unspecified: Secondary | ICD-10-CM | POA: Diagnosis present

## 2022-11-02 DIAGNOSIS — Z8249 Family history of ischemic heart disease and other diseases of the circulatory system: Secondary | ICD-10-CM | POA: Diagnosis not present

## 2022-11-02 DIAGNOSIS — M5416 Radiculopathy, lumbar region: Secondary | ICD-10-CM | POA: Diagnosis present

## 2022-11-02 DIAGNOSIS — M5116 Intervertebral disc disorders with radiculopathy, lumbar region: Principal | ICD-10-CM | POA: Diagnosis present

## 2022-11-02 DIAGNOSIS — Z91040 Latex allergy status: Secondary | ICD-10-CM

## 2022-11-02 DIAGNOSIS — Z833 Family history of diabetes mellitus: Secondary | ICD-10-CM

## 2022-11-02 LAB — COMPREHENSIVE METABOLIC PANEL
ALT: 14 U/L (ref 0–44)
AST: 16 U/L (ref 15–41)
Albumin: 3.7 g/dL (ref 3.5–5.0)
Alkaline Phosphatase: 58 U/L (ref 38–126)
Anion gap: 5 (ref 5–15)
BUN: 11 mg/dL (ref 6–20)
CO2: 24 mmol/L (ref 22–32)
Calcium: 8.4 mg/dL — ABNORMAL LOW (ref 8.9–10.3)
Chloride: 107 mmol/L (ref 98–111)
Creatinine, Ser: 0.87 mg/dL (ref 0.44–1.00)
GFR, Estimated: >60 mL/min (ref >60)
Glucose, Bld: 89 mg/dL (ref 70–99)
Potassium: 3.8 mmol/L (ref 3.5–5.1)
Sodium: 136 mmol/L (ref 135–145)
Total Bilirubin: 0.7 mg/dL (ref 0.3–1.2)
Total Protein: 6.8 g/dL (ref 6.5–8.1)

## 2022-11-02 LAB — CBC WITH DIFFERENTIAL/PLATELET
Abs Immature Granulocytes: 0.01 10*3/uL (ref 0.00–0.07)
Basophils Absolute: 0 10*3/uL (ref 0.0–0.1)
Basophils Relative: 0 %
Eosinophils Absolute: 0.1 10*3/uL (ref 0.0–0.5)
Eosinophils Relative: 1 %
HCT: 42.5 % (ref 36.0–46.0)
Hemoglobin: 14.1 g/dL (ref 12.0–15.0)
Immature Granulocytes: 0 %
Lymphocytes Relative: 35 %
Lymphs Abs: 1.9 10*3/uL (ref 0.7–4.0)
MCH: 30.7 pg (ref 26.0–34.0)
MCHC: 33.2 g/dL (ref 30.0–36.0)
MCV: 92.6 fL (ref 80.0–100.0)
Monocytes Absolute: 0.3 10*3/uL (ref 0.1–1.0)
Monocytes Relative: 6 %
Neutro Abs: 3.1 10*3/uL (ref 1.7–7.7)
Neutrophils Relative %: 58 %
Platelets: 239 10*3/uL (ref 150–400)
RBC: 4.59 MIL/uL (ref 3.87–5.11)
RDW: 12.1 % (ref 11.5–15.5)
WBC: 5.3 10*3/uL (ref 4.0–10.5)
nRBC: 0 % (ref 0.0–0.2)

## 2022-11-02 MED ORDER — TRAZODONE HCL 50 MG PO TABS: 75.0000 mg | ORAL_TABLET | Freq: Every evening | ORAL | Status: DC | PRN

## 2022-11-02 MED ORDER — METHOCARBAMOL 1000 MG/10ML IJ SOLN
500.0000 mg | Freq: Four times a day (QID) | INTRAVENOUS | Status: DC | PRN
  Administered 2022-11-03: 500 mg via INTRAVENOUS
  Filled 2022-11-02: qty 500

## 2022-11-02 MED ORDER — PHENOL 1.4 % MT LIQD: 1.0000 | OROMUCOSAL | Status: DC | PRN

## 2022-11-02 MED ORDER — ACETAMINOPHEN 325 MG PO TABS: 650.0000 mg | ORAL_TABLET | ORAL | Status: DC | PRN

## 2022-11-02 MED ORDER — SENNA 8.6 MG PO TABS
1.0000 | ORAL_TABLET | Freq: Two times a day (BID) | ORAL | Status: DC
  Administered 2022-11-02: 8.6 mg via ORAL
  Filled 2022-11-02: qty 1

## 2022-11-02 MED ORDER — ONDANSETRON HCL 4 MG/2ML IJ SOLN
4.0000 mg | Freq: Once | INTRAMUSCULAR | Status: AC
  Administered 2022-11-02: 4 mg via INTRAVENOUS
  Filled 2022-11-02: qty 2

## 2022-11-02 MED ORDER — METHOCARBAMOL 500 MG PO TABS: 500.0000 mg | ORAL_TABLET | Freq: Four times a day (QID) | ORAL | Status: DC | PRN

## 2022-11-02 MED ORDER — MORPHINE SULFATE (PF) 2 MG/ML IV SOLN: 2.0000 mg | INTRAVENOUS | Status: DC | PRN

## 2022-11-02 MED ORDER — PROPRANOLOL HCL 20 MG PO TABS
10.0000 mg | ORAL_TABLET | Freq: Once | ORAL | Status: AC
  Administered 2022-11-02: 10 mg via ORAL
  Filled 2022-11-02: qty 1

## 2022-11-02 MED ORDER — MORPHINE SULFATE (PF) 4 MG/ML IV SOLN
4.0000 mg | Freq: Once | INTRAVENOUS | Status: AC
  Administered 2022-11-02: 4 mg via INTRAVENOUS
  Filled 2022-11-02: qty 1

## 2022-11-02 MED ORDER — DEXAMETHASONE SODIUM PHOSPHATE 10 MG/ML IJ SOLN
4.0000 mg | Freq: Once | INTRAMUSCULAR | Status: AC
  Administered 2022-11-02: 4 mg via INTRAVENOUS
  Filled 2022-11-02: qty 1

## 2022-11-02 MED ORDER — MENTHOL 3 MG MT LOZG: 1.0000 | LOZENGE | OROMUCOSAL | Status: DC | PRN

## 2022-11-02 MED ORDER — ONDANSETRON HCL 4 MG PO TABS: 4.0000 mg | ORAL_TABLET | Freq: Four times a day (QID) | ORAL | Status: DC | PRN

## 2022-11-02 MED ORDER — GABAPENTIN 300 MG PO CAPS
300.0000 mg | ORAL_CAPSULE | Freq: Every day | ORAL | Status: DC
  Administered 2022-11-02: 300 mg via ORAL
  Filled 2022-11-02: qty 1

## 2022-11-02 MED ORDER — ONDANSETRON HCL 4 MG/2ML IJ SOLN: 4.0000 mg | Freq: Four times a day (QID) | INTRAMUSCULAR | Status: DC | PRN

## 2022-11-02 MED ORDER — FLUOXETINE HCL 20 MG PO CAPS
80.0000 mg | ORAL_CAPSULE | Freq: Every day | ORAL | Status: DC
  Administered 2022-11-02: 80 mg via ORAL
  Filled 2022-11-02: qty 4

## 2022-11-02 MED ORDER — POTASSIUM CHLORIDE IN NACL 20-0.9 MEQ/L-% IV SOLN
INTRAVENOUS | Status: DC
  Filled 2022-11-02 (×2): qty 1000

## 2022-11-02 MED ORDER — ACETAMINOPHEN 650 MG RE SUPP: 650.0000 mg | RECTAL | Status: DC | PRN

## 2022-11-02 MED ORDER — OXYCODONE HCL 5 MG PO TABS
5.0000 mg | ORAL_TABLET | ORAL | Status: DC | PRN
  Administered 2022-11-02 – 2022-11-03 (×2): 5 mg via ORAL
  Filled 2022-11-02 (×2): qty 1

## 2022-11-02 NOTE — H&P (Signed)
Adriana Lewis is an 27 y.o. female.   Chief Complaint: Right foot drop HPI: Adriana Lewis is here after experiencing a right foot drop this morning. She has a history of left L4/5 discectomy in 2021 but current symptoms are on right. MRI revealed a large right side L4/5 disc herniation and she is admitted for management.   Past Medical History:  Diagnosis Date   Anxiety    Irregular heartbeat    pt reports history of palpitations    Past Surgical History:  Procedure Laterality Date   DILATION AND EVACUATION N/A 02/02/2017   Procedure: DILATATION AND EVACUATION;  Surgeon: Harlin Heys, MD;  Location: [facility] OR[facility];  Service: Gynecology;  Laterality: N/A;   LUMBAR LAMINECTOMY/DECOMPRESSION MICRODISCECTOMY Left 01/11/2020   Procedure: LEFT L4-5 MICRODISCECTOMY;  Surgeon: Meade Maw, MD;  Location: [facility] OR[facility];  Service: Neurosurgery;  Laterality: Left;   TONSILLECTOMY  01/07/2022   UPPER GI ENDOSCOPY     WISDOM TOOTH EXTRACTION      Family History  Problem Relation Age of Onset   Diabetes Mother    Hypertension Mother    Varicose Veins Mother    Hepatitis B Father    Cirrhosis Father    Diabetes Sister    Hypertension Sister    Heart disease Maternal Grandmother    Cancer Paternal Grandmother        stomach   Diabetes Paternal Grandfather    COPD Paternal Grandfather    Social History:  reports that she has never smoked. She has never used smokeless tobacco. She reports that she does not drink alcohol and does not use drugs.  Allergies:  Allergies  Allergen Reactions   Latex Hives and Rash    Other reaction(s): Other (See Comments)   Sulfa Antibiotics Hives and Rash    Other reaction(s): UNKNOWN Other reaction(s): UNKNOWN Other reaction(s): UNKNOWN     (Not in a hospital admission)   No results found for this or any previous visit (from the past 48 hour(s)). MR LUMBAR SPINE WO CONTRAST  Result Date: 11/02/2022 CLINICAL DATA:  Low back pain EXAM: MRI  LUMBAR SPINE WITHOUT CONTRAST TECHNIQUE: Multiplanar, multisequence MR imaging of the lumbar spine was performed. No intravenous contrast was administered. COMPARISON:  08/30/2022 FINDINGS: Segmentation:  Standard Alignment:  Normal Vertebrae:  No fracture, evidence of discitis, or bone lesion. Conus medullaris and cauda equina: Conus extends to the L1 level. Conus and cauda equina appear normal. Paraspinal and other soft tissues: Negative Disc levels: L1-L2: Normal disc space and facet joints. No spinal canal stenosis. No neural foraminal stenosis. L2-L3: Normal disc space and facet joints. No spinal canal stenosis. No neural foraminal stenosis. L3-L4: Normal disc space and facet joints. No spinal canal stenosis. No neural foraminal stenosis. L4-L5: Large central disc extrusion with superior migration to the infrapedicle level is new. Severe spinal canal stenosis. No neural foraminal stenosis. L5-S1: Normal disc space and facet joints. No spinal canal stenosis. No neural foraminal stenosis. Visualized sacrum: Normal. IMPRESSION: New large central disc extrusion at L4-L5 with superior migration to the infrapedicle level causing severe spinal canal stenosis. Electronically Signed   By: Ulyses Jarred M.D.   On: 11/02/2022 19:57    Review of Systems Positive for foot weakness  Blood pressure 136/73, pulse 89, temperature 98 F (36.7 C), resp. rate 18, SpO2 99 %. Physical Exam  Deferred   Imaging:New large central disc extrusion at L4-L5 with superior migration to the infrapedicle level causing severe spinal canal stenosis.  Assessment/Plan Admit for likely surgical management of large right L4/5 disc herniation and new foot drop  Pain control NPO at midnight  Deetta Perla, MD 11/02/2022, 8:52 PM

## 2022-11-02 NOTE — ED Provider Notes (Signed)
[facility] Provider Note  Patient Contact: 3:19 PM (approximate)   History   Leg Pain   HPI  Adriana Lewis is a 27 y.o. female who presents the emergency department complaining of burning, numbness and weakness to her right lower extremity.  Patient has a long history of back issues.  She had a microdiscectomy 3 years ago.  Patient has been having off and on symptoms but historically patient has only had left-sided symptoms.  Patient has seen neurosurgery and neurology for this complaint.  She is scheduled to have EMG performed this week with neurology.  Patient states that today she woke up, had increased low back pain.  States that it was not a typical pain, took some Tylenol did not think anything of it.  Patient states that throughout the day her symptoms worsened and her main concern is that she is now having foot drop, weakness, numbness and tingling down her right leg which is very atypical for her.  No complaints of bowel or bladder dysfunction, saddle anesthesia.  Patient called her neurologist who referred her to the emergency department for further evaluation given the sudden change in side of her symptoms.  Again no trauma.  Patient denies any urinary or GI complaints.  Other than Tylenol no medications prior to arrival.     Physical Exam   Triage Vital Signs: ED Triage Vitals  Enc Vitals Group     BP 11/02/22 1413 136/73     Pulse Rate 11/02/22 1413 89     Resp 11/02/22 1413 18     Temp 11/02/22 1413 98 F (36.7 C)     Temp src --      SpO2 11/02/22 1413 99 %     Weight --      Height --      Head Circumference --      Peak Flow --      Pain Score 11/02/22 1412 6     Pain Loc --      Pain Edu? --      Excl. in [place]? --     Most recent vital signs: Vitals:   11/02/22 1413 11/02/22 2226  BP: 136/73 129/72  Pulse: 89 75  Resp: 18 16  Temp: 98 F (36.7 C) 97.9 F (36.6 C)  SpO2: 99% 99%     General: Alert and in no acute  distress.  Cardiovascular:  Good peripheral perfusion Respiratory: Normal respiratory effort without tachypnea or retractions. Lungs CTAB.  Gastrointestinal: Bowel sounds 4 quadrants. Soft and nontender to palpation. No guarding or rigidity. No palpable masses. No distention. No CVA tenderness. Musculoskeletal: Full range of motion to all extremities.  Visualization of the lumbar spine reveals no visible signs of trauma.  Patient is tender at midline extending into the right SI region.  Patient has this tenderness around L3-L4.  No palpable abnormality.  Remainder of lumbar spine is diffusely tender but point specific tender at the above-mentioned spot.  No tenderness over the sciatic notch bilaterally.  Patient does have some mild weakness of the right side when compared to left.  She is reporting decreased sensation in the right when compared to left but still has sensation present in all dermatomes. Neurologic:  No gross focal neurologic deficits are appreciated.  Skin:   No rash noted Other:   ED Results / Procedures / Treatments   Labs (all labs ordered are listed, but only abnormal results are displayed) Labs Reviewed  COMPREHENSIVE METABOLIC PANEL -  Abnormal; Notable for the following components:      Result Value   Calcium 8.4 (*)    All other components within normal limits  CBC WITH DIFFERENTIAL/PLATELET     EKG     RADIOLOGY  I personally viewed, evaluated, and interpreted these images as part of my medical decision making, as well as reviewing the written report by the radiologist.  ED Provider Interpretation: Patient's MRI reveals herniated disc which is new since previous MRI from January.  This has superior migration with severe spinal canal stenosis.  Reviewed MRI with on-call neurosurgeon  MR LUMBAR SPINE WO CONTRAST  Result Date: 11/02/2022 CLINICAL DATA:  Low back pain EXAM: MRI LUMBAR SPINE WITHOUT CONTRAST TECHNIQUE: Multiplanar, multisequence MR imaging of  the lumbar spine was performed. No intravenous contrast was administered. COMPARISON:  08/30/2022 FINDINGS: Segmentation:  Standard Alignment:  Normal Vertebrae:  No fracture, evidence of discitis, or bone lesion. Conus medullaris and cauda equina: Conus extends to the L1 level. Conus and cauda equina appear normal. Paraspinal and other soft tissues: Negative Disc levels: L1-L2: Normal disc space and facet joints. No spinal canal stenosis. No neural foraminal stenosis. L2-L3: Normal disc space and facet joints. No spinal canal stenosis. No neural foraminal stenosis. L3-L4: Normal disc space and facet joints. No spinal canal stenosis. No neural foraminal stenosis. L4-L5: Large central disc extrusion with superior migration to the infrapedicle level is new. Severe spinal canal stenosis. No neural foraminal stenosis. L5-S1: Normal disc space and facet joints. No spinal canal stenosis. No neural foraminal stenosis. Visualized sacrum: Normal. IMPRESSION: New large central disc extrusion at L4-L5 with superior migration to the infrapedicle level causing severe spinal canal stenosis. Electronically Signed   By: Ulyses Jarred M.D.   On: 11/02/2022 19:57    PROCEDURES:  Critical Care performed: No  Procedures   MEDICATIONS ORDERED IN ED: Medications  FLUoxetine (PROZAC) capsule 80 mg (80 mg Oral Given 11/02/22 2145)  traZODone (DESYREL) tablet 75 mg (has no administration in time range)  gabapentin (NEURONTIN) capsule 300 mg (300 mg Oral Given 11/02/22 2145)  acetaminophen (TYLENOL) tablet 650 mg (has no administration in time range)    Or  acetaminophen (TYLENOL) suppository 650 mg (has no administration in time range)  ondansetron (ZOFRAN) tablet 4 mg (has no administration in time range)    Or  ondansetron (ZOFRAN) injection 4 mg (has no administration in time range)  menthol-cetylpyridinium (CEPACOL) lozenge 3 mg (has no administration in time range)    Or  phenol (CHLORASEPTIC) mouth spray 1 spray  (has no administration in time range)  0.9 % NaCl with KCl 20 mEq/ L  infusion (has no administration in time range)  oxyCODONE (Oxy IR/ROXICODONE) immediate release tablet 5 mg (5 mg Oral Given 11/02/22 2356)  morphine (PF) 2 MG/ML injection 2 mg (has no administration in time range)  methocarbamol (ROBAXIN) tablet 500 mg (has no administration in time range)    Or  methocarbamol (ROBAXIN) 500 mg in dextrose 5 % 50 mL IVPB (has no administration in time range)  senna (SENOKOT) tablet 8.6 mg (8.6 mg Oral Given 11/02/22 2145)  dexamethasone (DECADRON) injection 4 mg (4 mg Intravenous Given 11/02/22 2049)  morphine (PF) 4 MG/ML injection 4 mg (4 mg Intravenous Given 11/02/22 2055)  ondansetron (ZOFRAN) injection 4 mg (4 mg Intravenous Given 11/02/22 2055)  propranolol (INDERAL) tablet 10 mg (10 mg Oral Given 11/02/22 2106)     IMPRESSION / MDM / ASSESSMENT AND PLAN / ED COURSE  I reviewed the triage vital signs and the nursing notes.                                 Differential diagnosis includes, but is not limited to, lumbago, sciatica, herniated disc, discitis  Patient's presentation is most consistent with acute presentation with potential threat to life or bodily function.   Patient's diagnosis is consistent with herniated disc.  Patient presents the emergency department with increased lower back pain, symptoms radiating down the right leg with right-sided lower leg weakness and foot drop.  Patient has had ongoing back issues, has been seen by neurosurgery in the past with microdiscectomy performed by Dr. Izora Ribas.  Patient has had some ongoing issues off and on and has seen both neurosurgery and neurology.  She is currently scheduled for an EMG to further evaluate her ongoing left-sided symptoms.  Today when patient woke up, was doing her daily tasks when she felt sudden sharp pain in her lower back.  She started having some pain radiating into the right leg which was atypical for the  patient but she felt it was likely sciatica.  Patient's symptoms progressed through the day and she started having weakness and foot drop on the right side.  She called her neurologist who referred her to the emergency department.  I am familiar with the patient as I saw her 2 months ago with similar symptoms down her left leg.  She has never had right-sided symptoms and given this I was concerned and ordered MRI.  MRI reveals new herniated disc with superior displacement resulting in severe spinal stenosis.  Given her right-sided weakness, foot drop I reached out to neurosurgery to evaluate patient's suitability for urgent/emergent neurosurgical intervention versus medication therapy with follow-up with neurosurgery.  Given her new symptoms on the right side, with findings on MRI it is recommended the patient be admitted to the hospital for surgical intervention.  Patient is agreeable with this plan.  She does have the deficits listed with weakness and foot drop but no frank loss of sensation, no bowel or bladder dysfunction or saddle anesthesia.  Patient care will be transferred to the neurosurgery team at this time.       FINAL CLINICAL IMPRESSION(S) / ED DIAGNOSES   Final diagnoses:  Lumbar herniated disc     Rx / DC Orders   ED Discharge Orders     None        Note:  This document was prepared using Dragon voice recognition software and may include unintentional dictation errors.   Darletta Moll, PA-C 11/03/22 0001    Blake Divine, MD 11/03/22 1517

## 2022-11-02 NOTE — ED Triage Notes (Signed)
Pt comes with c/o right leg numbness and tingling and right ankle pain. Pt states she didn't injure it. Pt states it started right sided back pain and it shot down leg and was cramping.

## 2022-11-03 ENCOUNTER — Inpatient Hospital Stay: Payer: BC Managed Care – PPO

## 2022-11-03 ENCOUNTER — Inpatient Hospital Stay: Payer: BC Managed Care – PPO | Admitting: Certified Registered"

## 2022-11-03 ENCOUNTER — Encounter: Payer: Self-pay | Admitting: Neurosurgery

## 2022-11-03 ENCOUNTER — Other Ambulatory Visit: Payer: Self-pay

## 2022-11-03 ENCOUNTER — Encounter
Admission: EM | Disposition: A | Discharge status: Home / Self Care | Payer: Self-pay | Source: Home / Self Care | Attending: Neurosurgery

## 2022-11-03 DIAGNOSIS — M5416 Radiculopathy, lumbar region: Secondary | ICD-10-CM

## 2022-11-03 DIAGNOSIS — M5126 Other intervertebral disc displacement, lumbar region: Secondary | ICD-10-CM

## 2022-11-03 DIAGNOSIS — M21371 Foot drop, right foot: Secondary | ICD-10-CM

## 2022-11-03 HISTORY — PX: HEMI-MICRODISCECTOMY LUMBAR LAMINECTOMY LEVEL 1: SHX5846

## 2022-11-03 LAB — POCT PREGNANCY, URINE: Preg Test, Ur: NEGATIVE

## 2022-11-03 SURGERY — HEMI-MICRODISCECTOMY LUMBAR LAMINECTOMY LEVEL 1
Anesthesia: General | Site: Back | Laterality: Right

## 2022-11-03 MED ORDER — FENTANYL CITRATE (PF) 100 MCG/2ML IJ SOLN
INTRAMUSCULAR | Status: AC
  Administered 2022-11-03: 50 ug via INTRAVENOUS
  Filled 2022-11-03: qty 2

## 2022-11-03 MED ORDER — REMIFENTANIL HCL 1 MG IV SOLR
INTRAVENOUS | Status: AC
  Filled 2022-11-03: qty 1000

## 2022-11-03 MED ORDER — ENOXAPARIN SODIUM 40 MG/0.4ML IJ SOSY
40.0000 mg | PREFILLED_SYRINGE | INTRAMUSCULAR | Status: DC
  Administered 2022-11-04: 40 mg via SUBCUTANEOUS
  Filled 2022-11-03: qty 0.4

## 2022-11-03 MED ORDER — OXYCODONE HCL 5 MG PO TABS: 5.0000 mg | ORAL_TABLET | Freq: Once | ORAL | Status: AC | PRN

## 2022-11-03 MED ORDER — KETOROLAC TROMETHAMINE 15 MG/ML IJ SOLN
15.0000 mg | Freq: Four times a day (QID) | INTRAMUSCULAR | Status: AC
  Administered 2022-11-03 – 2022-11-04 (×4): 15 mg via INTRAVENOUS
  Filled 2022-11-03 (×4): qty 1

## 2022-11-03 MED ORDER — MAGNESIUM CITRATE PO SOLN: 1.0000 | Freq: Once | ORAL | Status: DC | PRN

## 2022-11-03 MED ORDER — SENNA 8.6 MG PO TABS
1.0000 | ORAL_TABLET | Freq: Two times a day (BID) | ORAL | Status: DC
  Administered 2022-11-03 (×2): 8.6 mg via ORAL
  Filled 2022-11-03 (×3): qty 1

## 2022-11-03 MED ORDER — METHOCARBAMOL 1000 MG/10ML IJ SOLN: 500.0000 mg | Freq: Four times a day (QID) | INTRAVENOUS | Status: DC | PRN

## 2022-11-03 MED ORDER — CEFAZOLIN SODIUM 1 G IJ SOLR
INTRAMUSCULAR | Status: AC
  Filled 2022-11-03: qty 20

## 2022-11-03 MED ORDER — LIDOCAINE HCL (PF) 2 % IJ SOLN
INTRAMUSCULAR | Status: AC
  Filled 2022-11-03: qty 5

## 2022-11-03 MED ORDER — PHENOL 1.4 % MT LIQD: 1.0000 | OROMUCOSAL | Status: DC | PRN

## 2022-11-03 MED ORDER — METHYLPREDNISOLONE ACETATE 40 MG/ML IJ SUSP
INTRAMUSCULAR | Status: AC
  Filled 2022-11-03: qty 1

## 2022-11-03 MED ORDER — MENTHOL 3 MG MT LOZG: 1.0000 | LOZENGE | OROMUCOSAL | Status: DC | PRN

## 2022-11-03 MED ORDER — 0.9 % SODIUM CHLORIDE (POUR BTL) OPTIME
TOPICAL | Status: DC | PRN
  Administered 2022-11-03: 1000 mL

## 2022-11-03 MED ORDER — FENTANYL CITRATE (PF) 100 MCG/2ML IJ SOLN
INTRAMUSCULAR | Status: DC | PRN
  Administered 2022-11-03 (×4): 50 ug via INTRAVENOUS

## 2022-11-03 MED ORDER — DEXAMETHASONE SODIUM PHOSPHATE 10 MG/ML IJ SOLN
INTRAMUSCULAR | Status: AC
  Filled 2022-11-03: qty 1

## 2022-11-03 MED ORDER — PROPOFOL 10 MG/ML IV BOLUS
INTRAVENOUS | Status: DC | PRN
  Administered 2022-11-03: 150 mg via INTRAVENOUS

## 2022-11-03 MED ORDER — LIDOCAINE HCL (CARDIAC) PF 100 MG/5ML IV SOSY
PREFILLED_SYRINGE | INTRAVENOUS | Status: DC | PRN
  Administered 2022-11-03: 100 mg via INTRAVENOUS

## 2022-11-03 MED ORDER — ONDANSETRON HCL 4 MG/2ML IJ SOLN
INTRAMUSCULAR | Status: AC
  Filled 2022-11-03: qty 2

## 2022-11-03 MED ORDER — CEFAZOLIN SODIUM-DEXTROSE 2-3 GM-%(50ML) IV SOLR
INTRAVENOUS | Status: DC | PRN
  Administered 2022-11-03: 2 g via INTRAVENOUS

## 2022-11-03 MED ORDER — OXYCODONE HCL 5 MG/5ML PO SOLN: 5.0000 mg | Freq: Once | ORAL | Status: AC | PRN

## 2022-11-03 MED ORDER — POLYETHYLENE GLYCOL 3350 17 G PO PACK
17.0000 g | PACK | Freq: Every day | ORAL | Status: DC | PRN
  Administered 2022-11-04: 17 g via ORAL
  Filled 2022-11-03 (×2): qty 1

## 2022-11-03 MED ORDER — MIDAZOLAM HCL 2 MG/2ML IJ SOLN
INTRAMUSCULAR | Status: AC
  Filled 2022-11-03: qty 2

## 2022-11-03 MED ORDER — BUPIVACAINE HCL (PF) 0.5 % IJ SOLN
INTRAMUSCULAR | Status: AC
  Filled 2022-11-03: qty 60

## 2022-11-03 MED ORDER — OXYCODONE HCL 5 MG PO TABS
5.0000 mg | ORAL_TABLET | ORAL | Status: DC | PRN
  Administered 2022-11-04: 5 mg via ORAL
  Filled 2022-11-03: qty 1

## 2022-11-03 MED ORDER — KETOROLAC TROMETHAMINE 30 MG/ML IJ SOLN
INTRAMUSCULAR | Status: AC
  Filled 2022-11-03: qty 1

## 2022-11-03 MED ORDER — ACETAMINOPHEN 500 MG PO TABS
1000.0000 mg | ORAL_TABLET | Freq: Four times a day (QID) | ORAL | Status: DC
  Administered 2022-11-03 – 2022-11-04 (×5): 1000 mg via ORAL
  Filled 2022-11-03 (×5): qty 2

## 2022-11-03 MED ORDER — METHYLPREDNISOLONE ACETATE 40 MG/ML IJ SUSP
INTRAMUSCULAR | Status: DC | PRN
  Administered 2022-11-03: 40 mg

## 2022-11-03 MED ORDER — OXYCODONE HCL 5 MG PO TABS
ORAL_TABLET | ORAL | Status: AC
  Administered 2022-11-03: 5 mg via ORAL
  Filled 2022-11-03: qty 1

## 2022-11-03 MED ORDER — OXYCODONE HCL 5 MG PO TABS
10.0000 mg | ORAL_TABLET | ORAL | Status: DC | PRN
  Administered 2022-11-03 – 2022-11-04 (×3): 10 mg via ORAL
  Filled 2022-11-03 (×3): qty 2

## 2022-11-03 MED ORDER — SODIUM CHLORIDE (PF) 0.9 % IJ SOLN
INTRAMUSCULAR | Status: DC | PRN
  Administered 2022-11-03: 60 mL

## 2022-11-03 MED ORDER — FENTANYL CITRATE (PF) 100 MCG/2ML IJ SOLN
25.0000 ug | INTRAMUSCULAR | Status: AC | PRN
  Administered 2022-11-03 (×4): 25 ug via INTRAVENOUS

## 2022-11-03 MED ORDER — MORPHINE SULFATE (PF) 2 MG/ML IV SOLN
1.0000 mg | INTRAVENOUS | Status: DC | PRN
  Administered 2022-11-03: 1 mg via INTRAVENOUS
  Filled 2022-11-03: qty 1

## 2022-11-03 MED ORDER — SODIUM CHLORIDE 0.9 % IV SOLN: 250.0000 mL | INTRAVENOUS | Status: DC

## 2022-11-03 MED ORDER — SURGIFLO WITH THROMBIN (HEMOSTATIC MATRIX KIT) OPTIME
TOPICAL | Status: DC | PRN
  Administered 2022-11-03: 1 via TOPICAL

## 2022-11-03 MED ORDER — MIDAZOLAM HCL 2 MG/2ML IJ SOLN
INTRAMUSCULAR | Status: DC | PRN
  Administered 2022-11-03: 2 mg via INTRAVENOUS

## 2022-11-03 MED ORDER — METHOCARBAMOL 500 MG PO TABS: 500.0000 mg | ORAL_TABLET | Freq: Four times a day (QID) | ORAL | Status: DC | PRN

## 2022-11-03 MED ORDER — SUCCINYLCHOLINE CHLORIDE 200 MG/10ML IV SOSY
PREFILLED_SYRINGE | INTRAVENOUS | Status: AC
  Filled 2022-11-03: qty 10

## 2022-11-03 MED ORDER — EPHEDRINE SULFATE (PRESSORS) 50 MG/ML IJ SOLN
INTRAMUSCULAR | Status: DC | PRN
  Administered 2022-11-03: 10 mg via INTRAVENOUS

## 2022-11-03 MED ORDER — BISACODYL 10 MG RE SUPP
10.0000 mg | Freq: Every day | RECTAL | Status: DC | PRN
  Administered 2022-11-04: 10 mg via RECTAL
  Filled 2022-11-03: qty 1

## 2022-11-03 MED ORDER — LACTATED RINGERS IV SOLN: INTRAVENOUS | Status: DC

## 2022-11-03 MED ORDER — ACETAMINOPHEN 10 MG/ML IV SOLN: 1000.0000 mg | Freq: Once | INTRAVENOUS | Status: DC | PRN

## 2022-11-03 MED ORDER — SUCCINYLCHOLINE CHLORIDE 200 MG/10ML IV SOSY
PREFILLED_SYRINGE | INTRAVENOUS | Status: DC | PRN
  Administered 2022-11-03: 140 mg via INTRAVENOUS

## 2022-11-03 MED ORDER — ONDANSETRON HCL 4 MG PO TABS
4.0000 mg | ORAL_TABLET | Freq: Four times a day (QID) | ORAL | Status: DC | PRN
  Administered 2022-11-03: 4 mg via ORAL
  Filled 2022-11-03: qty 1

## 2022-11-03 MED ORDER — FENTANYL CITRATE (PF) 100 MCG/2ML IJ SOLN
INTRAMUSCULAR | Status: AC
  Filled 2022-11-03: qty 2

## 2022-11-03 MED ORDER — FENTANYL CITRATE (PF) 100 MCG/2ML IJ SOLN: 50.0000 ug | Freq: Once | INTRAMUSCULAR | Status: AC

## 2022-11-03 MED ORDER — FENTANYL CITRATE (PF) 100 MCG/2ML IJ SOLN
INTRAMUSCULAR | Status: AC
  Administered 2022-11-03: 25 ug via INTRAVENOUS
  Filled 2022-11-03: qty 2

## 2022-11-03 MED ORDER — SODIUM CHLORIDE 0.9% FLUSH: 3.0000 mL | INTRAVENOUS | Status: DC | PRN

## 2022-11-03 MED ORDER — DEXAMETHASONE SODIUM PHOSPHATE 10 MG/ML IJ SOLN
INTRAMUSCULAR | Status: DC | PRN
  Administered 2022-11-03: 10 mg via INTRAVENOUS

## 2022-11-03 MED ORDER — EPINEPHRINE PF 1 MG/ML IJ SOLN
INTRAMUSCULAR | Status: AC
  Filled 2022-11-03: qty 1

## 2022-11-03 MED ORDER — SODIUM CHLORIDE 0.9 % IV SOLN
INTRAVENOUS | Status: DC | PRN
  Administered 2022-11-03: .1 ug/kg/min via INTRAVENOUS

## 2022-11-03 MED ORDER — ONDANSETRON HCL 4 MG/2ML IJ SOLN
4.0000 mg | Freq: Four times a day (QID) | INTRAMUSCULAR | Status: DC | PRN
  Administered 2022-11-04 (×2): 4 mg via INTRAVENOUS
  Filled 2022-11-03 (×2): qty 2

## 2022-11-03 MED ORDER — ONDANSETRON HCL 4 MG/2ML IJ SOLN
INTRAMUSCULAR | Status: AC
  Administered 2022-11-03: 4 mg via INTRAVENOUS
  Filled 2022-11-03: qty 2

## 2022-11-03 MED ORDER — SODIUM CHLORIDE 0.9% FLUSH
3.0000 mL | Freq: Two times a day (BID) | INTRAVENOUS | Status: DC
  Administered 2022-11-03 (×2): 3 mL via INTRAVENOUS

## 2022-11-03 MED ORDER — ORAL CARE MOUTH RINSE: 15.0000 mL | OROMUCOSAL | Status: DC | PRN

## 2022-11-03 MED ORDER — PROPOFOL 10 MG/ML IV BOLUS
INTRAVENOUS | Status: AC
  Filled 2022-11-03: qty 20

## 2022-11-03 MED ORDER — PROPOFOL 500 MG/50ML IV EMUL
INTRAVENOUS | Status: DC | PRN
  Administered 2022-11-03: 135 ug/kg/min via INTRAVENOUS

## 2022-11-03 MED ORDER — SODIUM CHLORIDE FLUSH 0.9 % IV SOLN
INTRAVENOUS | Status: AC
  Filled 2022-11-03: qty 20

## 2022-11-03 MED ORDER — DOCUSATE SODIUM 100 MG PO CAPS
100.0000 mg | ORAL_CAPSULE | Freq: Two times a day (BID) | ORAL | Status: DC
  Administered 2022-11-03 (×2): 100 mg via ORAL
  Filled 2022-11-03 (×3): qty 1

## 2022-11-03 MED ORDER — ONDANSETRON HCL 4 MG/2ML IJ SOLN: 4.0000 mg | Freq: Once | INTRAMUSCULAR | Status: AC | PRN

## 2022-11-03 MED ORDER — BUPIVACAINE LIPOSOME 1.3 % IJ SUSP
INTRAMUSCULAR | Status: AC
  Filled 2022-11-03: qty 20

## 2022-11-03 MED ORDER — SODIUM CHLORIDE 0.9 % IV SOLN: INTRAVENOUS | Status: DC

## 2022-11-03 MED ORDER — KETOROLAC TROMETHAMINE 30 MG/ML IJ SOLN
INTRAMUSCULAR | Status: DC | PRN
  Administered 2022-11-03: 30 mg via INTRAVENOUS

## 2022-11-03 MED ORDER — BUPIVACAINE-EPINEPHRINE (PF) 0.5% -1:200000 IJ SOLN
INTRAMUSCULAR | Status: DC | PRN
  Administered 2022-11-03: 2.5 mL

## 2022-11-03 MED ORDER — ONDANSETRON HCL 4 MG/2ML IJ SOLN
INTRAMUSCULAR | Status: DC | PRN
  Administered 2022-11-03: 4 mg via INTRAVENOUS

## 2022-11-03 MED ORDER — CHLORHEXIDINE GLUCONATE 0.12 % MT SOLN
15.0000 mL | Freq: Once | OROMUCOSAL | Status: AC
  Administered 2022-11-03: 15 mL via OROMUCOSAL
  Filled 2022-11-03: qty 15

## 2022-11-03 SURGICAL SUPPLY — 38 items
BASIN KIT SINGLE STR (MISCELLANEOUS) ×1 IMPLANT
BUR NEURO DRILL SOFT 3.0X3.8M (BURR) ×1 IMPLANT
CNTNR URN SCR LID CUP LEK RST (MISCELLANEOUS) ×1 IMPLANT
CONT SPEC 4OZ STRL OR WHT (MISCELLANEOUS) ×1
DERMABOND ADVANCED .7 DNX12 (GAUZE/BANDAGES/DRESSINGS) ×1 IMPLANT
DRAPE C ARM PK CFD 31 SPINE (DRAPES) ×1 IMPLANT
DRAPE LAPAROTOMY 100X77 ABD (DRAPES) ×1 IMPLANT
DRAPE MICROSCOPE SPINE 48X150 (DRAPES) ×1 IMPLANT
DRSG OPSITE POSTOP 4X6 (GAUZE/BANDAGES/DRESSINGS) IMPLANT
ELECT EZSTD 165MM 6.5IN (MISCELLANEOUS) ×1
ELECT REM PT RETURN 9FT ADLT (ELECTROSURGICAL) ×1
ELECTRODE EZSTD 165MM 6.5IN (MISCELLANEOUS) IMPLANT
ELECTRODE REM PT RTRN 9FT ADLT (ELECTROSURGICAL) ×1 IMPLANT
GLOVE BIOGEL PI IND STRL 6.5 (GLOVE) ×1 IMPLANT
GLOVE BIOGEL PI IND STRL 8.5 (GLOVE) ×2 IMPLANT
GLOVE SURG SYN 6.5 ES PF (GLOVE) ×5 IMPLANT
GLOVE SURG SYN 6.5 PF PI (GLOVE) ×2 IMPLANT
GLOVE SURG SYN 8.5  E (GLOVE) ×3
GLOVE SURG SYN 8.5 E (GLOVE) ×3 IMPLANT
GLOVE SURG SYN 8.5 PF PI (GLOVE) ×3 IMPLANT
GOWN SRG LRG LVL 4 IMPRV REINF (GOWNS) ×1 IMPLANT
GOWN SRG XL LVL 3 NONREINFORCE (GOWNS) ×1 IMPLANT
GOWN STRL NON-REIN TWL XL LVL3 (GOWNS) ×1
GOWN STRL REIN LRG LVL4 (GOWNS) ×2
KIT SPINAL PRONEVIEW (KITS) ×1 IMPLANT
MANIFOLD NEPTUNE II (INSTRUMENTS) ×1 IMPLANT
NDL SAFETY ECLIP 18X1.5 (MISCELLANEOUS) ×1 IMPLANT
NS IRRIG 1000ML POUR BTL (IV SOLUTION) ×1 IMPLANT
PACK LAMINECTOMY NEURO (CUSTOM PROCEDURE TRAY) ×1 IMPLANT
PAD ARMBOARD 7.5X6 YLW CONV (MISCELLANEOUS) ×1 IMPLANT
SURGIFLO W/THROMBIN 8M KIT (HEMOSTASIS) ×1 IMPLANT
SUT DVC VLOC 3-0 CL 6 P-12 (SUTURE) ×1 IMPLANT
SUT VIC AB 0 CT1 27 (SUTURE) ×1
SUT VIC AB 0 CT1 27XCR 8 STRN (SUTURE) ×1 IMPLANT
SUT VIC AB 2-0 CT1 18 (SUTURE) ×1 IMPLANT
SYR 30ML LL (SYRINGE) ×2 IMPLANT
SYR 3ML LL SCALE MARK (SYRINGE) ×1 IMPLANT
TRAP FLUID SMOKE EVACUATOR (MISCELLANEOUS) ×1 IMPLANT

## 2022-11-03 NOTE — Transfer of Care (Signed)
Immediate Anesthesia Transfer of Care Note  Patient: Adriana Lewis  Procedure(s) Performed: HEMI-MICRODISCECTOMY LUMBAR LAMINECTOMY LEVEL 1 (Right: Back)  Patient Location: PACU  Anesthesia Type:General  Level of Consciousness: awake and alert   Airway & Oxygen Therapy: Patient Spontanous Breathing and Patient connected to face mask oxygen  Post-op Assessment: Report given to RN and Post -op Vital signs reviewed and stable  Post vital signs: Reviewed and stable  Last Vitals:  Vitals Value Taken Time  BP 128/74 11/03/22 1107  Temp 36.7 C 11/03/22 1107  Pulse 102 11/03/22 1110  Resp 25 11/03/22 1110  SpO2 99 % 11/03/22 1110  Vitals shown include unvalidated device data.  Last Pain:  Vitals:   11/03/22 0834  TempSrc: Oral  PainSc: 6          Complications: No notable events documented.

## 2022-11-03 NOTE — Op Note (Signed)
Indications: [name] is suffering from lumbar radiculopathy and foot drop.  She developed her foot drop yesterday afternoon with profound weakness.  Due to her weakness, I recommended surgical intervention.  She had MRI imaging showing a large acute disc herniation with severe stenosis.  Please note that she previously had a discectomy at this level.  Findings: large L4/5 disc herniation  Preoperative Diagnosis: Lumbar radiculopathy (ICD-10 M54.16), disc herniation, foot drop Postoperative Diagnosis: same   EBL: 10 ml IVF: see AR ml Drains: none Disposition: Extubated and Stable to PACU Complications: none  No foley catheter was placed.   Preoperative Note:   Risks of surgery discussed include: infection, bleeding, stroke, coma, death, paralysis, CSF leak, nerve/spinal cord injury, numbness, tingling, weakness, complex regional pain syndrome, recurrent stenosis and/or disc herniation, vascular injury, development of instability, neck/back pain, need for further surgery, persistent symptoms, development of deformity, and the risks of anesthesia. The patient understood these risks and agreed to proceed.  Operative Note:   1) Right L4/5 microdiscectomy  The patient was then brought from the preoperative center with intravenous access established.  The patient underwent general anesthesia and endotracheal tube intubation, and was then rotated on the [place] rail top where all pressure points were appropriately padded.  The skin was then thoroughly cleansed.  Perioperative antibiotic prophylaxis was administered.  Sterile prep and drapes were then applied and a timeout was then observed.  C-arm was brought into the field under sterile conditions, and the L4-5 disc space identified. The prior incision was identified.   Once this was complete, the incision was opened and extended superiorly and inferiorly.   The Metrx tubes were sequentially advanced under lateral fluoroscopy until  a 18 x 80 mm Metrx tube was placed over the facet and lamina and secured to the bed.    The microscope was then sterilely brought into the field and muscle creep was hemostased with a bipolar and resected with a pituitary rongeur.  A Bovie extender was then used to expose the spinous process and lamina.  Careful attention was placed to not violate the facet capsule. A 3 mm matchstick drill bit was then used to make a hemi-laminotomy trough until the ligamentum flavum was exposed.  This was extended to the base of the spinous process.  Once this was complete and the underlying ligamentum flavum was visualized, the ligamentum was dissected with an up angle curette and resected with a #2 and #3 mm biting Kerrison.  The laminotomy opening was also expanded in similar fashion and hemostasis was obtained with Surgifoam and a patty as well as bone wax.  The epidural space had a minor amount of scarring from her prior discectomy.   Once the underlying dura was visualized a Penfield 4 was then used to dissect and expose the traversing nerve root.  Once this was identified a nerve root retractor suction was used to mobilize this medially.  The venous plexus was hemostased with Surgifoam and light bipolar use.  A small penfied was then used to make a small annulotomy within the disc space and disc space contents were noted to come through the annulus.    The disc herniation was identified and dissected free using a balltip probe. The pituitary rongeur was used to remove the extruded disc fragments. Once the thecal sac and nerve root were noted to be relaxed and under less tension the ball-tipped feeler was passed along the foramen distally to ensure no residual compression was noted.  Depo-Medrol was placed along the nerve root.  The area was irrigated. The tube system was then removed under microscopic visualization and hemostasis was obtained with a bipolar.    The fascial layer was reapproximated with the use of  a 0- Vicryl suture.  Subcutaneous tissue layer was reapproximated using 2-0 Vicryl suture.  3-0 monocryl was used on the skin. The skin was then cleansed and Dermabond was used to close the skin opening.  Patient was then rotated back to the preoperative bed awakened from anesthesia and taken to recovery all counts are correct in this case.   I performed the entire procedure with the assistance of Cooper Render PA as an Pensions consultant. An assistant was required for this procedure due to the complexity.  The assistant provided assistance in tissue manipulation and suction, and was required for the successful and safe performance of the procedure. I performed the critical portions of the procedure.   Meade Maw MD

## 2022-11-03 NOTE — Plan of Care (Signed)

## 2022-11-03 NOTE — Interval H&P Note (Signed)
History and Physical Interval Note:  11/03/2022 9:10 AM  Adriana Lewis  has presented today for surgery, with the diagnosis of lumbar radiculopathy, foot drop.  The various methods of treatment have been discussed with the patient and family. After consideration of risks, benefits and other options for treatment, the patient has consented to  Procedure(s) with comments: HEMI-MICRODISCECTOMY LUMBAR LAMINECTOMY LEVEL 1 (Right) - R L4/5 microdiscectomy as a surgical intervention.  The patient's history has been reviewed, patient examined, no change in status, stable for surgery.  I have reviewed the patient's chart and labs.  Questions were answered to the patient's satisfaction.    Heart sounds normal no MRG. Chest Clear to Auscultation Bilaterally.   Shantaya Bluestone

## 2022-11-03 NOTE — Anesthesia Preprocedure Evaluation (Signed)
Anesthesia Evaluation  Patient identified by MRN, date of birth, ID band Patient awake    Reviewed: Allergy & Precautions, NPO status , Patient's Chart, lab work & pertinent test results  History of Anesthesia Complications (+) PONV and history of anesthetic complications  Airway Mallampati: II  TM Distance: >3 FB Neck ROM: Full    Dental no notable dental hx. (+) Teeth Intact   Pulmonary neg pulmonary ROS, neg sleep apnea, neg COPD, Patient abstained from smoking.Not current smoker   Pulmonary exam normal breath sounds clear to auscultation       Cardiovascular Exercise Tolerance: Good METS(-) hypertension(-) CAD and (-) Past MI negative cardio ROS (-) dysrhythmias  Rhythm:Regular Rate:Normal - Systolic murmurs    Neuro/Psych  PSYCHIATRIC DISORDERS Anxiety      Neuromuscular disease    GI/Hepatic ,neg GERD  ,,(+)     (-) substance abuse    Endo/Other  neg diabetes    Renal/GU negative Renal ROS     Musculoskeletal  (+) Arthritis ,    Abdominal   Peds  Hematology   Anesthesia Other Findings Past Medical History: No date: Anxiety No date: Irregular heartbeat     Comment:  pt reports history of palpitations  Reproductive/Obstetrics                              Anesthesia Physical Anesthesia Plan  ASA: 2  Anesthesia Plan: General   Post-op Pain Management: Ofirmev IV (intra-op)* and Dilaudid IV   Induction: Intravenous  PONV Risk Score and Plan: 4 or greater and Ondansetron, Dexamethasone, Midazolam, TIVA and Propofol infusion  Airway Management Planned: Oral ETT  Additional Equipment: None  Intra-op Plan:   Post-operative Plan: Extubation in OR  Informed Consent: I have reviewed the patients History and Physical, chart, labs and discussed the procedure including the risks, benefits and alternatives for the proposed anesthesia with the patient or authorized representative  who has indicated his/her understanding and acceptance.     Dental advisory given  Plan Discussed with: CRNA and Surgeon  Anesthesia Plan Comments: (Discussed risks of anesthesia with patient, including PONV, sore throat, lip/dental/eye damage. Rare risks discussed as well, such as cardiorespiratory and neurological sequelae, and allergic reactions. Discussed the role of CRNA in patient's perioperative care. Patient understands.)         Anesthesia Quick Evaluation

## 2022-11-03 NOTE — Anesthesia Procedure Notes (Signed)
Procedure Name: Intubation Date/Time: 11/03/2022 9:49 AM  Performed by: Fredderick Phenix, CRNAPre-anesthesia Checklist: Patient identified, Emergency Drugs available, Suction available and Patient being monitored Patient Re-evaluated:Patient Re-evaluated prior to induction Oxygen Delivery Method: Circle system utilized Preoxygenation: Pre-oxygenation with 100% oxygen Induction Type: IV induction Ventilation: Mask ventilation without difficulty Laryngoscope Size: McGraph and 3 Grade View: Grade I Tube type: Oral Tube size: 7.0 mm Number of attempts: 1 Airway Equipment and Method: Stylet and Oral airway Placement Confirmation: ETT inserted through vocal cords under direct vision, positive ETCO2 and breath sounds checked- equal and bilateral Secured at: 21 cm Tube secured with: Tape Dental Injury: Teeth and Oropharynx as per pre-operative assessment

## 2022-11-03 NOTE — H&P (View-Only) (Signed)
Consult requested by:  Dr. Lacinda Lewis  Consult requested for:  Assuming care for L4-5 disc herniation and foot drop  Primary Physician:  Adriana Mis, MD  History of Present Illness: 11/03/2022 [name] presented to the hospital yesterday after having abrupt worsening of her right lower extremity pain.  She is well-known to my clinic, having had prior left-sided L4-5 microdiscectomy 3 years ago.    She notes that she felt an acute change in her symptoms yesterday morning when she rolled out of bed.  At that time, she began having severe pain down her right leg and noticed that she was beginning to have altered sensation and her right leg below her knee.  Over the next several hours, she began having weakness in her right lower extremity with difficulty walking.  She presented to the emergency department yesterday afternoon with significant symptoms of right-sided foot drop.  She was evaluated and found to have a substantial acute disc herniation at L4-5 on the right side which could explain her symptoms.  She was admitted for symptomatic control and consideration of microdiscectomy.  She currently has no symptoms of cauda equina syndrome.  The symptoms are causing a significant impact on the patient's life.   I have utilized the care everywhere function in epic to review the outside records available from external health systems.  Review of Systems:  A 10 point review of systems is negative, except for the pertinent positives and negatives detailed in the HPI.  Past Medical History: Past Medical History:  Diagnosis Date   Anxiety    Irregular heartbeat    pt reports history of palpitations    Past Surgical History: Past Surgical History:  Procedure Laterality Date   DILATION AND EVACUATION N/A 02/02/2017   Procedure: DILATATION AND EVACUATION;  Surgeon: Adriana Heys, MD;  Location: [facility];  Service: Gynecology;  Laterality: N/A;   LUMBAR  LAMINECTOMY/DECOMPRESSION MICRODISCECTOMY Left 01/11/2020   Procedure: LEFT L4-5 MICRODISCECTOMY;  Surgeon: Adriana Maw, MD;  Location: [facility] OR[facility];  Service: Neurosurgery;  Laterality: Left;   TONSILLECTOMY  01/07/2022   UPPER GI ENDOSCOPY     WISDOM TOOTH EXTRACTION      Allergies: Allergies as of 11/02/2022 - Review Complete 11/02/2022  Allergen Reaction Noted   Latex Hives and Rash 02/01/2015   Sulfa antibiotics Hives and Rash 09/07/2013    Medications: Current Meds  Medication Sig   cyanocobalamin (VITAMIN B12) 1000 MCG tablet Take 1,000 mcg by mouth daily.   ferrous sulfate 325 (65 FE) MG tablet Take 325 mg by mouth daily with breakfast.   FLUoxetine (PROZAC) 40 MG capsule Take 80 mg by mouth daily.   gabapentin (NEURONTIN) 300 MG capsule TAKE 1 CAPSULE(300 MG) BY MOUTH AT BEDTIME   propranolol (INDERAL) 10 MG tablet Take 10 mg by mouth as needed.   traZODone (DESYREL) 50 MG tablet Take 75 mg by mouth at bedtime as needed.   Vitamin D, Ergocalciferol, (DRISDOL) 1.25 MG (50000 UNIT) CAPS capsule Take 50,000 Units by mouth every 7 (seven) days. Monday    Social History: Social History   Tobacco Use   Smoking status: Never   Smokeless tobacco: Never  Vaping Use   Vaping Use: Never used  Substance Use Topics   Alcohol use: No    Comment: rarely 2-3 glasses of wine per year   Drug use: No    Family Medical History: Family History  Problem Relation Age of Onset   Diabetes Mother  Hypertension Mother    Varicose Veins Mother    Hepatitis B Father    Cirrhosis Father    Diabetes Sister    Hypertension Sister    Heart disease Maternal Grandmother    Cancer Paternal Grandmother        stomach   Diabetes Paternal Grandfather    COPD Paternal Grandfather     Physical Examination: Vitals:   11/02/22 1413 11/02/22 2226  BP: 136/73 129/72  Pulse: 89 75  Resp: 18 16  Temp: 98 F (36.7 C) 97.9 F (36.6 C)  SpO2: 99% 99%   Heart sounds normal no MRG.  Chest Clear to Auscultation Bilaterally.   General: Patient is well developed, well nourished, calm, collected, and in no apparent distress. Attention to examination is appropriate.  Neck:   Supple.  Full range of motion.  Respiratory: Patient is breathing without any difficulty.   NEUROLOGICAL:     Awake, alert, oriented to person, place, and time.  Speech is clear and fluent.  Cranial Nerves: Pupils equal round and reactive to light.  Facial tone is symmetric.  Facial sensation is symmetric. Shoulder shrug is symmetric. Tongue protrusion is midline.  There is no pronator drift.  ROM of spine: full.    Strength: Side Biceps Triceps Deltoid Interossei Grip Wrist Ext. Wrist Flex.  R 5 5 5 5 5 5 5   L 5 5 5 5 5 5 5    Side Iliopsoas Quads Hamstring PF DF EHL  R 5 5 5 5 1 2   L 5 5 5 5 5 5    Reflexes are 1+ and symmetric at the biceps, triceps, brachioradialis, patella and achilles.   Hoffman's is absent.   Eversion and inversion are impaired on the R. She has +SLR at 30 degrees on the R, 60 degrees on L  Bilateral upper and lower extremity sensation show right-sided L5 mediated altered sensation.  No evidence of dysmetria noted.  Gait is untested.     Medical Decision Making  Imaging: MRI L spine 11/02/2022 IMPRESSION: New large central disc extrusion at L4-L5 with superior migration to the infrapedicle level causing severe spinal canal stenosis.     Electronically Signed   By: Adriana Lewis M.D.   On: 11/02/2022 19:57  I have personally reviewed the images and agree with the above interpretation.  Assessment and Plan: [name] is a pleasant 27 y.o. female with acute L4-5 disc herniation on the right with resulting right-sided foot drop.  This began less than 24 hours ago.  She has profound weakness in her right lower limb in L5 mediated muscles.  With weakness to this degree, conservative management is not indicated.  There is evidence to support early  intervention with person with acute foot drop secondary to a large disc herniation.  Given how profound her right lower extremity weakness is, I recommended surgical intervention with a right-sided L4-5 microdiscectomy.  Please note that she previously had a discectomy at this level.  Thus, this will be a recurrent microdiscectomy.  I discussed the planned procedure at length with the patient, including the risks, benefits, alternatives, and indications. The risks discussed include but are not limited to bleeding, infection, need for reoperation, spinal fluid leak, stroke, vision loss, anesthetic complication, coma, paralysis, and even death. I also described in detail that improvement was not guaranteed.  The patient expressed understanding of these risks, and asked that we proceed with surgery. I described the surgery in layman's terms, and gave ample opportunity for questions,  which were answered to the best of my ability.      Lillian Tigges K. Izora Ribas MD, [facility] Neurosurgery

## 2022-11-03 NOTE — Anesthesia Postprocedure Evaluation (Signed)
Anesthesia Post Note  Patient: Adriana Lewis  Procedure(s) Performed: HEMI-MICRODISCECTOMY LUMBAR LAMINECTOMY LEVEL 1 (Right: Back)  Patient location during evaluation: PACU Anesthesia Type: General Level of consciousness: awake and alert Pain management: pain level controlled Vital Signs Assessment: post-procedure vital signs reviewed and stable Respiratory status: spontaneous breathing, nonlabored ventilation, respiratory function stable and patient connected to nasal cannula oxygen Cardiovascular status: blood pressure returned to baseline and stable Postop Assessment: no apparent nausea or vomiting Anesthetic complications: no   No notable events documented.   Last Vitals:  Vitals:   11/03/22 1215 11/03/22 1220  BP:  130/66  Pulse: 91 76  Resp: 17 16  Temp:    SpO2: 96% 96%    Last Pain:  Vitals:   11/03/22 1215  TempSrc:   PainSc: 2                  Arita Miss

## 2022-11-03 NOTE — Consult Note (Signed)
Consult requested by:  Dr. Lacinda Axon  Consult requested for:  Assuming care for L4-5 disc herniation and foot drop  Primary Physician:  Derenda Mis, MD  History of Present Illness: 11/03/2022 Adriana Lewis presented to the hospital yesterday after having abrupt worsening of her right lower extremity pain.  She is well-known to my clinic, having had prior left-sided L4-5 microdiscectomy 3 years ago.    She notes that she felt an acute change in her symptoms yesterday morning when she rolled out of bed.  At that time, she began having severe pain down her right leg and noticed that she was beginning to have altered sensation and her right leg below her knee.  Over the next several hours, she began having weakness in her right lower extremity with difficulty walking.  She presented to the emergency department yesterday afternoon with significant symptoms of right-sided foot drop.  She was evaluated and found to have a substantial acute disc herniation at L4-5 on the right side which could explain her symptoms.  She was admitted for symptomatic control and consideration of microdiscectomy.  She currently has no symptoms of cauda equina syndrome.  The symptoms are causing a significant impact on the patient's life.   I have utilized the care everywhere function in epic to review the outside records available from external health systems.  Review of Systems:  A 10 point review of systems is negative, except for the pertinent positives and negatives detailed in the HPI.  Past Medical History: Past Medical History:  Diagnosis Date   Anxiety    Irregular heartbeat    pt reports history of palpitations    Past Surgical History: Past Surgical History:  Procedure Laterality Date   DILATION AND EVACUATION N/A 02/02/2017   Procedure: DILATATION AND EVACUATION;  Surgeon: Harlin Heys, MD;  Location: [facility] ORS;  Service: Gynecology;  Laterality: N/A;   LUMBAR  LAMINECTOMY/DECOMPRESSION MICRODISCECTOMY Left 01/11/2020   Procedure: LEFT L4-5 MICRODISCECTOMY;  Surgeon: Meade Maw, MD;  Location: [facility] ORS;  Service: Neurosurgery;  Laterality: Left;   TONSILLECTOMY  01/07/2022   UPPER GI ENDOSCOPY     WISDOM TOOTH EXTRACTION      Allergies: Allergies as of 11/02/2022 - Review Complete 11/02/2022  Allergen Reaction Noted   Latex Hives and Rash 02/01/2015   Sulfa antibiotics Hives and Rash 09/07/2013    Medications: Current Meds  Medication Sig   cyanocobalamin (VITAMIN B12) 1000 MCG tablet Take 1,000 mcg by mouth daily.   ferrous sulfate 325 (65 FE) MG tablet Take 325 mg by mouth daily with breakfast.   FLUoxetine (PROZAC) 40 MG capsule Take 80 mg by mouth daily.   gabapentin (NEURONTIN) 300 MG capsule TAKE 1 CAPSULE(300 MG) BY MOUTH AT BEDTIME   propranolol (INDERAL) 10 MG tablet Take 10 mg by mouth as needed.   traZODone (DESYREL) 50 MG tablet Take 75 mg by mouth at bedtime as needed.   Vitamin D, Ergocalciferol, (DRISDOL) 1.25 MG (50000 UNIT) CAPS capsule Take 50,000 Units by mouth every 7 (seven) days. Monday    Social History: Social History   Tobacco Use   Smoking status: Never   Smokeless tobacco: Never  Vaping Use   Vaping Use: Never used  Substance Use Topics   Alcohol use: No    Comment: rarely 2-3 glasses of wine per year   Drug use: No    Family Medical History: Family History  Problem Relation Age of Onset   Diabetes Mother  Hypertension Mother    Varicose Veins Mother    Hepatitis B Father    Cirrhosis Father    Diabetes Sister    Hypertension Sister    Heart disease Maternal Grandmother    Cancer Paternal Grandmother        stomach   Diabetes Paternal Grandfather    COPD Paternal Grandfather     Physical Examination: Vitals:   11/02/22 1413 11/02/22 2226  BP: 136/73 129/72  Pulse: 89 75  Resp: 18 16  Temp: 98 F (36.7 C) 97.9 F (36.6 C)  SpO2: 99% 99%   Heart sounds normal no MRG.  Chest Clear to Auscultation Bilaterally.   General: Patient is well developed, well nourished, calm, collected, and in no apparent distress. Attention to examination is appropriate.  Neck:   Supple.  Full range of motion.  Respiratory: Patient is breathing without any difficulty.   NEUROLOGICAL:     Awake, alert, oriented to person, place, and time.  Speech is clear and fluent.  Cranial Nerves: Pupils equal round and reactive to light.  Facial tone is symmetric.  Facial sensation is symmetric. Shoulder shrug is symmetric. Tongue protrusion is midline.  There is no pronator drift.  ROM of spine: full.    Strength: Side Biceps Triceps Deltoid Interossei Grip Wrist Ext. Wrist Flex.  R 5 5 5 5 5 5 5   L 5 5 5 5 5 5 5    Side Iliopsoas Quads Hamstring PF DF EHL  R 5 5 5 5 1 2   L 5 5 5 5 5 5    Reflexes are 1+ and symmetric at the biceps, triceps, brachioradialis, patella and achilles.   Hoffman's is absent.   Eversion and inversion are impaired on the R. She has +SLR at 30 degrees on the R, 60 degrees on L  Bilateral upper and lower extremity sensation show right-sided L5 mediated altered sensation.  No evidence of dysmetria noted.  Gait is untested.     Medical Decision Making  Imaging: MRI L spine 11/02/2022 IMPRESSION: New large central disc extrusion at L4-L5 with superior migration to the infrapedicle level causing severe spinal canal stenosis.     Electronically Signed   By: Ulyses Jarred M.D.   On: 11/02/2022 19:57  I have personally reviewed the images and agree with the above interpretation.  Assessment and Plan: Adriana Lewis is a pleasant 27 y.o. female with acute L4-5 disc herniation on the right with resulting right-sided foot drop.  This began less than 24 hours ago.  She has profound weakness in her right lower limb in L5 mediated muscles.  With weakness to this degree, conservative management is not indicated.  There is evidence to support early  intervention with person with acute foot drop secondary to a large disc herniation.  Given how profound her right lower extremity weakness is, I recommended surgical intervention with a right-sided L4-5 microdiscectomy.  Please note that she previously had a discectomy at this level.  Thus, this will be a recurrent microdiscectomy.  I discussed the planned procedure at length with the patient, including the risks, benefits, alternatives, and indications. The risks discussed include but are not limited to bleeding, infection, need for reoperation, spinal fluid leak, stroke, vision loss, anesthetic complication, coma, paralysis, and even death. I also described in detail that improvement was not guaranteed.  The patient expressed understanding of these risks, and asked that we proceed with surgery. I described the surgery in layman's terms, and gave ample opportunity for questions,  which were answered to the best of my ability.      Shekelia Boutin K. Izora Ribas MD, [facility] Neurosurgery

## 2022-11-04 ENCOUNTER — Encounter: Payer: Self-pay | Admitting: Neurosurgery

## 2022-11-04 MED ORDER — OXYCODONE HCL 5 MG PO TABS: 5.0000 mg | ORAL_TABLET | ORAL | 0 refills | Status: AC | PRN

## 2022-11-04 MED ORDER — SENNA 8.6 MG PO TABS: 1.0000 | ORAL_TABLET | Freq: Two times a day (BID) | ORAL | 0 refills | Status: DC

## 2022-11-04 MED ORDER — FLUOXETINE HCL 20 MG PO CAPS
40.0000 mg | ORAL_CAPSULE | Freq: Every day | ORAL | Status: DC
  Administered 2022-11-04: 40 mg via ORAL
  Filled 2022-11-04: qty 2

## 2022-11-04 MED ORDER — METHOCARBAMOL 500 MG PO TABS: 500.0000 mg | ORAL_TABLET | Freq: Four times a day (QID) | ORAL | 0 refills | Status: DC | PRN

## 2022-11-04 MED ORDER — PROCHLORPERAZINE MALEATE 5 MG PO TABS: 5.0000 mg | ORAL_TABLET | Freq: Four times a day (QID) | ORAL | Status: DC | PRN

## 2022-11-04 MED ORDER — PROCHLORPERAZINE MALEATE 5 MG PO TABS: 5.0000 mg | ORAL_TABLET | Freq: Four times a day (QID) | ORAL | 0 refills | Status: DC | PRN

## 2022-11-04 NOTE — Discharge Instructions (Signed)
Your surgeon has performed an operation on your lumbar spine (low back) to relieve pressure on one or more nerves. Many times, patients feel better immediately after surgery and can "overdo it." Even if you feel well, it is important that you follow these activity guidelines. If you do not let your back heal properly from the surgery, you can increase the chance of a disc herniation and/or return of your symptoms. The following are instructions to help in your recovery once you have been discharged from the hospital.  * It is ok to take NSAIDs after surgery.  Activity    No bending, lifting, or twisting ("BLT"). Avoid lifting objects heavier than 10 pounds (gallon milk jug).  Where possible, avoid household activities that involve lifting, bending, pushing, or pulling such as laundry, vacuuming, grocery shopping, and childcare. Try to arrange for help from friends and family for these activities while your back heals.  Increase physical activity slowly as tolerated.  Taking short walks is encouraged, but avoid strenuous exercise. Do not jog, run, bicycle, lift weights, or participate in any other exercises unless specifically allowed by your doctor. Avoid prolonged sitting, including car rides.  Talk to your doctor before resuming sexual activity.  You should not drive until cleared by your doctor.  Until released by your doctor, you should not return to work or school.  You should rest at home and let your body heal.   You may shower two days after your surgery.  After showering, lightly dab your incision dry. Do not take a tub bath or go swimming for 3 weeks, or until approved by your doctor at your follow-up appointment.  If you smoke, we strongly recommend that you quit.  Smoking has been proven to interfere with normal healing in your back and will dramatically reduce the success rate of your surgery. Please contact [facility] (800-QUIT-NOW) and use the resources at www.[facility].com for  assistance in stopping smoking.  Surgical Incision   If you have a dressing on your incision, you may remove it three days after your surgery. Keep your incision area clean and dry.  If you have staples or stitches on your incision, you should have a follow up scheduled for removal. If you do not have staples or stitches, you will have steri-strips (small pieces of surgical tape) or Dermabond glue. The steri-strips/glue should begin to peel away within about a week (it is fine if the steri-strips fall off before then). If the strips are still in place one week after your surgery, you may gently remove them.  Diet            You may return to your usual diet. Be sure to stay hydrated.  When to Contact Us  Although your surgery and recovery will likely be uneventful, you may have some residual numbness, aches, and pains in your back and/or legs. This is normal and should improve in the next few weeks.  However, should you experience any of the following, contact us immediately: New numbness or weakness Pain that is progressively getting worse, and is not relieved by your pain medications or rest Bleeding, redness, swelling, pain, or drainage from surgical incision Chills or flu-like symptoms Fever greater than 101.0 F (38.3 C) Problems with bowel or bladder functions Difficulty breathing or shortness of breath Warmth, tenderness, or swelling in your calf  Contact Information During office hours (Monday-Friday 9 am to 5 pm), please call your physician at [phone] and ask for Kendelyn Jean After hours and   weekends, please call [phone] and speak with the neurosurgeon on call For a life-threatening emergency, call 911 

## 2022-11-04 NOTE — Evaluation (Signed)
Occupational Therapy Evaluation Patient Details Name: Adriana Lewis MRN: [id number] DOB: Jul 02, 1996 Today's Date: 11/04/2022   History of Present Illness 27 y.o presenting with acute worsening of lumbar radiculopathy and a right foot drop status post right L4-5 microdiscectomy   Clinical Impression   Upon entering the room, pt supine in bed and agreeable to OT intervention. Pt reports living at home with significant other and children. She plans to stay with other family at initial discharge. Pt is Ind at baseline with all aspects of care and IADLs. Pt performs bed mobility Independently. Pt is able to perform figure four position to thread pants and socks onto B LEs and stands with supervision to pull over B hips . Pt dons UB clothing items with set up A to obtain needed items. Discussed home set up and pt has thought out plan and assistance to return home and be successful. Pt returns to supine without physical assistance. Call bell and all needed items within reach. Pt does not need further skilled OT intervention. Education completed and OT to sign off.      Recommendations for follow up therapy are one component of a multi-disciplinary discharge planning process, led by the attending physician.  Recommendations may be updated based on patient status, additional functional criteria and insurance authorization.   Follow Up Recommendations  No OT follow up     Assistance Recommended at Discharge PRN        Equipment Recommendations  None recommended by OT       Precautions / Restrictions Precautions Precautions: Fall Restrictions Weight Bearing Restrictions: No      Mobility Bed Mobility Overal bed mobility: Modified Independent                  Transfers Overall transfer level: Modified independent Equipment used: Rolling walker (2 wheels)                      Balance Overall balance assessment: Independent                                          ADL either performed or assessed with clinical judgement   ADL Overall ADL's : Modified independent                                       General ADL Comments: supervision - mod I with cuing for technique to fully dress self during session.     Vision Patient Visual Report: No change from baseline              Pertinent Vitals/Pain Pain Assessment Pain Assessment: No/denies pain     Hand Dominance Right   Extremity/Trunk Assessment Upper Extremity Assessment Upper Extremity Assessment: Overall WFL for tasks assessed   Lower Extremity Assessment Lower Extremity Assessment: Overall WFL for tasks assessed       Communication Communication Communication: No difficulties   Cognition Arousal/Alertness: Awake/alert Behavior During Therapy: WFL for tasks assessed/performed Overall Cognitive Status: Within Functional Limits for tasks assessed                                       General Comments  Pt with some low grade dizziness  t/o the session that was largely unchanged regardless of position, activity, etc.  BP seated 126/82, standing 124/68, seated post ambulation 132/88            Home Living Family/patient expects to be discharged to:: Private residence Living Arrangements: Spouse/significant other;Children Available Help at Discharge: Available 24 hours/day   Home Access: Stairs to enter [employer] of Steps: 4 Entrance Stairs-Rails: Can reach both Home Layout: One level     Bathroom Shower/Tub: Tub/shower unit         Home Equipment: Conservation officer, nature (2 wheels)          Prior Functioning/Environment Prior Level of Function : Independent/Modified Independent                                 OT Goals(Current goals can be found in the care plan section) Acute Rehab OT Goals Patient Stated Goal: return to PLOF OT Goal Formulation: With patient Time For Goal Achievement:  11/04/22 Potential to Achieve Goals: Good  OT Frequency:         AM-PAC OT "6 Clicks" Daily Activity     Outcome Measure Help from another person eating meals?: None Help from another person taking care of personal grooming?: None Help from another person toileting, which includes using toliet, bedpan, or urinal?: None Help from another person bathing (including washing, rinsing, drying)?: None Help from another person to put on and taking off regular upper body clothing?: None Help from another person to put on and taking off regular lower body clothing?: None 6 Click Score: 24   End of Session Equipment Utilized During Treatment: Rolling walker (2 wheels) Nurse Communication: Mobility status  Activity Tolerance: Patient tolerated treatment well Patient left: in bed;with call bell/phone within reach;with bed alarm set                   Time: 1100-1121 OT Time Calculation (min): 21 min Charges:  OT General Charges $OT Visit: 1 Visit OT Evaluation $OT Eval Low Complexity: 1 Low OT Treatments $Self Care/Home Management : 8-22 mins  Darleen Crocker, MS, OTR/L , CBIS ascom [phone]  11/04/22, 1:26 PM

## 2022-11-04 NOTE — Plan of Care (Signed)
  Problem: Activity: Goal: Risk for activity intolerance will decrease Outcome: Progressing   Problem: Pain Managment: Goal: General experience of comfort will improve Outcome: Progressing   Problem: Safety: Goal: Ability to remain free from injury will improve Outcome: Progressing   Problem: Skin Integrity: Goal: Risk for impaired skin integrity will decrease Outcome: Progressing   Problem: Activity: Goal: Ability to avoid complications of mobility impairment will improve Outcome: Progressing Goal: Ability to tolerate increased activity will improve Outcome: Progressing Goal: Will remain free from falls Outcome: Progressing   Problem: Pain Management: Goal: Pain level will decrease Outcome: Progressing   Problem: Skin Integrity: Goal: Will show signs of wound healing Outcome: Progressing

## 2022-11-04 NOTE — TOC Progression Note (Signed)
Transition of Care [facility]) - Progression Note    Patient Details  Name: Adriana Lewis MRN: [id number] Date of Birth: [birth year]  Transition of Care [facility]) CM/SW [place], RN Phone Number: 11/04/2022, 10:48 AM  Clinical Narrative:     She walked about 300 with Physical therapy using a rolling walker, and she was able to manage up/down steps without assist.  She is going to be going to her mother's house so will have 24/7 assistance  Expected Discharge Plan: Home/Self Care Barriers to Discharge: No Barriers Identified  Expected Discharge Plan and Services   Discharge Planning Services: CM Consult   Living arrangements for the past 2 months: Single Family Home                 DME Arranged: N/A DME Agency: NA       HH Arranged: NA           Social Determinants of Health (SDOH) Interventions SDOH Screenings   Food Insecurity: No Food Insecurity (11/02/2022)  Housing: Low Risk  (11/02/2022)  Transportation Needs: No Transportation Needs (11/02/2022)  Utilities: Not At Risk (11/02/2022)  Tobacco Use: Low Risk  (11/04/2022)    Readmission Risk Interventions     No data to display

## 2022-11-04 NOTE — Progress Notes (Signed)
    Attending Progress Note  History: Dnya Lizzeth Buse is s/p right L4-5 microdiscectomy  POD1: Improved pain and weakness since surgery.  Patient continues to have numbness and tingling in her right lower extremity.  Physical Exam: Vitals:   11/04/22 0352 11/04/22 0512  BP: 111/62 131/70  Pulse: 65 89  Resp: 20 20  Temp: 98 F (36.7 C)   SpO2: 100% 100%    AA Ox3 CNI  Strength:5/5 throughout left LLE. 4- right DF, 3 right EHL, 5/5 proximally    Data:  Other tests/results: none  Assessment/Plan:  Marlys Malayja Nic[place] is a 27 year old presenting with acute worsening of lumbar radiculopathy and new onset of right foot drop status post right L4-5 microdiscectomy  - mobilize - pain control - DVT prophylaxis - PTOT; dispo planning underway  Cooper Render [facility] Department of Neurosurgery

## 2022-11-04 NOTE — Plan of Care (Signed)

## 2022-11-04 NOTE — Evaluation (Signed)
Physical Therapy Evaluation Patient Details Name: Adriana Lewis MRN: [id number] DOB: 04/23/1996 Today's Date: 11/04/2022  History of Present Illness  27 y.o presenting with acute worsening of lumbar radiculopathy and a right foot drop status post right L4-5 microdiscectomy  Clinical Impression  Pt showed good effort with all tasks and did not need any physical assist with mobility, transfers or ambulation.  She showed good confidence with ambulation and though she did not need AD she was more confident and had increased speed/consistency with FWW.  Pt was able to to negotiate up/down steps with relative ease.  Improved since yesterday, but still having R distal limb numbness and weak DF/toe ext.     Recommendations for follow up therapy are one component of a multi-disciplinary discharge planning process, led by the attending physician.  Recommendations may be updated based on patient status, additional functional criteria and insurance authorization.  Follow Up Recommendations Follow physician's recommendations for discharge plan and follow up therapies      Assistance Recommended at Discharge Set up Supervision/Assistance  Patient can return home with the following  Assist for transportation;Assistance with cooking/housework    Equipment Recommendations None recommended by PT (has RW)  Recommendations for Other Services       Functional Status Assessment Patient has had a recent decline in their functional status and demonstrates the ability to make significant improvements in function in a reasonable and predictable amount of time.     Precautions / Restrictions Precautions Precautions: Fall (mod) Restrictions Weight Bearing Restrictions: No      Mobility  Bed Mobility Overal bed mobility: Modified Independent                  Transfers Overall transfer level: Modified independent Equipment used: Rolling walker (2 wheels)               General  transfer comment: Pt able to rise with appropriate UE use, no assist.  Expected but mild hesitancy able to maintain balance w/o UE reliance    Ambulation/Gait Ambulation/Gait assistance: Supervision Gait Distance (Feet): 300 Feet Assistive device: Rolling walker (2 wheels), None         General Gait Details: Pt able to safely ambulate with and w/o AD, she was slower and more guarded w/o UE support.  She did not have any toe drag, etc on R but was more deliberate and cautious with swing-through and weight acceptance w/o excessive limp.  Stairs Stairs: Yes Stairs assistance: Modified independent (Device/Increase time) Stair Management: One rail Right Number of Stairs: 4 General stair comments: Pt able to negotiate up/down steps w/o assist, good awareness  Wheelchair Mobility    Modified Rankin (Stroke Patients Only)       Balance Overall balance assessment: Independent                                           Pertinent Vitals/Pain Pain Assessment Pain Assessment:  (mild pain that is improving)    Home Living Family/patient expects to be discharged to:: Private residence Living Arrangements: Spouse/significant other;Children Available Help at Discharge: Available 24 hours/day (d/c to mothers for 24/7 assist)   Home Access: Stairs to enter Entrance Stairs-Rails: Can reach both Entrance Stairs-Number of Steps: 4 (2 flights to enter her home, 4 steps at d/c venue)     Home Equipment: Conservation officer, nature (2 wheels)      Prior  Function Prior Level of Function : Independent/Modified Independent                     Hand Dominance        Extremity/Trunk Assessment   Upper Extremity Assessment Upper Extremity Assessment: Overall WFL for tasks assessed    Lower Extremity Assessment Lower Extremity Assessment: Overall WFL for tasks assessed (except R ankle DF 4/5, toe ext 3/5)       Communication   Communication: No difficulties  Cognition  Arousal/Alertness: Awake/alert Behavior During Therapy: WFL for tasks assessed/performed Overall Cognitive Status: Within Functional Limits for tasks assessed                                          General Comments General comments (skin integrity, edema, etc.): Pt with some low grade dizziness t/o the session that was largely unchanged regardless of position, activity, etc.  BP seated 126/82, standing 124/68, seated post ambulation 132/88    Exercises     Assessment/Plan    PT Assessment Patient needs continued PT services  PT Problem List Decreased strength;Decreased activity tolerance;Decreased balance;Decreased safety awareness       PT Treatment Interventions DME instruction;Gait training;Stair training;Functional mobility training;Therapeutic activities;Therapeutic exercise;Neuromuscular re-education;Patient/family education;Balance training    PT Goals (Current goals can be found in the Care Plan section)  Acute Rehab PT Goals Patient Stated Goal: Go home PT Goal Formulation: With patient Time For Goal Achievement: 11/17/22 Potential to Achieve Goals: Good    Frequency Min 2X/week     Co-evaluation               AM-PAC PT "6 Clicks" Mobility  Outcome Measure Help needed turning from your back to your side while in a flat bed without using bedrails?: None Help needed moving from lying on your back to sitting on the side of a flat bed without using bedrails?: None Help needed moving to and from a bed to a chair (including a wheelchair)?: None Help needed standing up from a chair using your arms (e.g., wheelchair or bedside chair)?: None Help needed to walk in hospital room?: None Help needed climbing 3-5 steps with a railing? : None 6 Click Score: 24    End of Session Equipment Utilized During Treatment: Gait belt Activity Tolerance: Patient tolerated treatment well Patient left: in chair;with call bell/phone within reach Nurse  Communication: Mobility status PT Visit Diagnosis: Difficulty in walking, not elsewhere classified (R26.2);Pain;Muscle weakness (generalized) (M62.81) Pain - Right/Left: Right Pain - part of body:  (lumbago)    Time: [id number] PT Time Calculation (min) (ACUTE ONLY): 32 min   Charges:   PT Evaluation $PT Eval Low Complexity: 1 Low PT Treatments $Therapeutic Activity: 8-22 mins        Kreg Shropshire, DPT 11/04/2022, 12:02 PM

## 2022-11-04 NOTE — Discharge Summary (Addendum)
Discharge Summary  Patient ID: Adriana Lewis MRN: [id number] DOB/AGE: 15-Jun-1996 27 y.o.  Admit date: 11/02/2022 Discharge date: 11/04/2022  Admission Diagnoses:  Lumbar radiculopathy (ICD-10 M54.16), disc herniation, foot drop   Discharge Diagnoses:  Principal Problem:   Lumbar radiculopathy Active Problems:   Lumbar herniated disc   Foot drop, right Obesity  Discharged Condition: good  Hospital Course:  Adriana Lewis is a 27 y.o presenting with acute worsening of lumbar radiculopathy and a right foot drop status post right L4-5 microdiscectomy.  Her intraoperative course was uncomplicated and she was admitted for therapy evaluation.  While she continued to have some right foot weakness postoperatively, it did improve significantly.  She continues to have numbness and tingling in her foot.  She was seen by therapy and deemed appropriate for discharge home on postop day 1.  She was discharged with prescription for oxycodone, Robaxin, and senna.  Consults: None  Significant Diagnostic Studies: None  Treatments: surgery: As above.  Please see separately dictated operative report for further details.  Discharge Exam: Blood pressure 126/82, pulse 90, temperature 98.2 F (36.8 C), resp. rate 17, height 5\' 7"  (1.702 m), weight 113.4 kg, SpO2 100 %.   AA Ox3 CNI   Strength:5/5 throughout left LLE. 4- right DF, 3 right EHL, 5/5 proximally     Disposition: Discharge disposition: 01-Home or Self Care       Discharge Instructions     Incentive spirometry RT   Complete by: As directed       Allergies as of 11/04/2022       Reactions   Latex Hives, Rash   Other reaction(s): Other (See Comments)   Sulfa Antibiotics Hives, Rash   Other reaction(s): UNKNOWN Other reaction(s): UNKNOWN Other reaction(s): UNKNOWN        Medication List     TAKE these medications    cyanocobalamin 1000 MCG tablet Commonly known as: VITAMIN B12 Take 1,000 mcg by mouth  daily.   ferrous sulfate 325 (65 FE) MG tablet Take 325 mg by mouth daily with breakfast.   FLUoxetine 40 MG capsule Commonly known as: PROZAC Take 80 mg by mouth daily.   gabapentin 300 MG capsule Commonly known as: NEURONTIN TAKE 1 CAPSULE(300 MG) BY MOUTH AT BEDTIME   methocarbamol 500 MG tablet Commonly known as: ROBAXIN Take 1 tablet (500 mg total) by mouth every 6 (six) hours as needed for muscle spasms.   oxyCODONE 5 MG immediate release tablet Commonly known as: Oxy IR/ROXICODONE Take 1 tablet (5 mg total) by mouth every 4 (four) hours as needed for up to 7 days for moderate pain ((score 4 to 6)).   propranolol 10 MG tablet Commonly known as: INDERAL Take 10 mg by mouth as needed.   senna 8.6 MG Tabs tablet Commonly known as: SENOKOT Take 1 tablet (8.6 mg total) by mouth 2 (two) times daily.   traZODone 50 MG tablet Commonly known as: DESYREL Take 75 mg by mouth at bedtime as needed.   Vitamin D (Ergocalciferol) 1.25 MG (50000 UNIT) Caps capsule Commonly known as: DRISDOL Take 50,000 Units by mouth every 7 (seven) days. Monday        Follow-up Information     Loleta Dicker, PA Follow up on 11/14/2022.   Specialty: Neurosurgery Why: 10.30am Contact information: [address] [place] [address] [phone]                 Signed: Loleta Dicker 11/04/2022, 10:57 AM

## 2022-11-08 ENCOUNTER — Other Ambulatory Visit: Payer: Self-pay | Admitting: Neurosurgery

## 2022-11-14 ENCOUNTER — Encounter: Payer: Self-pay | Admitting: Neurosurgery

## 2022-11-14 ENCOUNTER — Ambulatory Visit (INDEPENDENT_AMBULATORY_CARE_PROVIDER_SITE_OTHER): Payer: BC Managed Care – PPO | Admitting: Neurosurgery

## 2022-11-14 ENCOUNTER — Encounter: Payer: BC Managed Care – PPO | Admitting: Neurosurgery

## 2022-11-14 VITALS — BP 115/70 | HR 80 | Temp 98.7°F | Ht 67.0 in | Wt 250.0 lb

## 2022-11-14 DIAGNOSIS — Z09 Encounter for follow-up examination after completed treatment for conditions other than malignant neoplasm: Secondary | ICD-10-CM

## 2022-11-14 DIAGNOSIS — M5416 Radiculopathy, lumbar region: Secondary | ICD-10-CM

## 2022-11-14 NOTE — Progress Notes (Signed)
   REFERRING PHYSICIAN:  Derenda Mis, [place],   [id number]  DOS: 11/03/22 right L4-5 microdisectomy   HISTORY OF PRESENT ILLNESS: Jasmyne Sieminski Thorpe is approximately 2 weeks status post her discectomy. she is doing well.  She reports complete resolution of her right leg pain but continues to have numbness and some weakness in her right foot.  She is not currently taking any medications for pain.  PHYSICAL EXAMINATION:  General: Patient is well developed, well nourished, calm, collected, and in no apparent distress.   NEUROLOGICAL:  General: In no acute distress.   Awake, alert, oriented to person, place, and time.  Pupils equal round and reactive to light.  Facial tone is symmetric.  Tongue protrusion is midline.  There is no pronator drift.   Strength:            Side Iliopsoas Quads Hamstring PF DF EHL  R 5 5 5  4+ 4- 3  L 5 5 5 5 5 5    Incision c/d/i   ROS (Neurologic):  Negative except as noted above  IMAGING: No interval imaging to review.  ASSESSMENT/PLAN:  Shamyla Grennan Aguino is doing well approximately 2 weeks after her discectomy.  Discussed that residual numbness is not uncommon at this point and may persist for quite some time or not resolve entirely.  We discussed activity escalation and I have advised the patient to lift up to 10 pounds until 6 weeks after surgery, then increase up to 25 pounds until 12 weeks after surgery.  After 12 weeks post-op, the patient advised to increase activity as tolerated.  I would like for her to remain out of work until her follow-up with Dr. Cari Caraway.  She has been provided a note for this.  I would also like for her to initiate physical therapy.  I have placed a referral to [facility] to start this is soon as possible.  she will follow up 4 weeks with Dr. Izora Ribas.  Advised to contact the office if any questions or concerns arise.  Cooper Render PA-C Department of neurosurgery

## 2022-12-16 ENCOUNTER — Encounter: Payer: Self-pay | Admitting: Neurosurgery

## 2022-12-16 ENCOUNTER — Ambulatory Visit (INDEPENDENT_AMBULATORY_CARE_PROVIDER_SITE_OTHER): Payer: BC Managed Care – PPO | Admitting: Neurosurgery

## 2022-12-16 VITALS — BP 110/64 | HR 85 | Temp 97.9°F | Ht 67.0 in | Wt 250.0 lb

## 2022-12-16 DIAGNOSIS — M5416 Radiculopathy, lumbar region: Secondary | ICD-10-CM

## 2022-12-16 DIAGNOSIS — Z09 Encounter for follow-up examination after completed treatment for conditions other than malignant neoplasm: Secondary | ICD-10-CM

## 2022-12-16 NOTE — Progress Notes (Signed)
   REFERRING PHYSICIAN:  No referring provider defined for this encounter.  DOS: 11/03/22 right L4-5 microdisectomy   HISTORY OF PRESENT ILLNESS: Adriana Lewis is status post her discectomy.  Her pain is improved compared to preop.  She is still having numbness in her right lower leg and on her foot.  She still having some weakness.  She continues to have some pain down her leg even though it is better than it was prior to surgery.  She particularly notices this when she sits in 1 position for some period of time.  When she changes positions, her pain is better.   PHYSICAL EXAMINATION:  General: Patient is well developed, well nourished, calm, collected, and in no apparent distress.   NEUROLOGICAL:  General: In no acute distress.   Awake, alert, oriented to person, place, and time.  Pupils equal round and reactive to light.  Facial tone is symmetric.  Tongue protrusion is midline.  There is no pronator drift.   Strength:            Side Iliopsoas Quads Hamstring PF DF EHL  R 5 5 5  4+ 4- 3  L 5 5 5 5 5 5    Incision c/d/i   ROS (Neurologic):  Negative except as noted above  IMAGING: No interval imaging to review.  ASSESSMENT/PLAN:  Adriana Lewis is doing well after her discectomy.  She still having some residual symptoms.  Her pain is better than it was prior to surgery, but not quite where I would have hoped at this point.  She is doing physical therapy and will continue to do so.  We discussed considering an injection.  She would like to hold off for now.  She is slated to return to work on Thursday.  Based on her symptoms, I think some flexibility in her work situation is warranted.  Will see her back in 6 weeks.  If she continues to have symptoms at that time, we will have to consider EMG and possible repeat MRI to rule out any residual or recurrent disc herniation.    Venetia Night MD Department of neurosurgery

## 2022-12-17 ENCOUNTER — Encounter: Payer: Self-pay | Admitting: Neurosurgery

## 2022-12-24 DIAGNOSIS — M5416 Radiculopathy, lumbar region: Secondary | ICD-10-CM

## 2022-12-30 ENCOUNTER — Emergency Department: Payer: BC Managed Care – PPO

## 2022-12-30 ENCOUNTER — Emergency Department
Admission: EM | Admit: 2022-12-30 | Discharge: 2022-12-30 | Disposition: A | Discharge status: Home / Self Care | Payer: Self-pay | Attending: Student in an Organized Health Care Education/Training Program | Admitting: Student in an Organized Health Care Education/Training Program

## 2022-12-30 ENCOUNTER — Other Ambulatory Visit: Payer: Self-pay

## 2022-12-30 ENCOUNTER — Telehealth: Payer: Self-pay | Admitting: Neurosurgery

## 2022-12-30 DIAGNOSIS — M21371 Foot drop, right foot: Secondary | ICD-10-CM | POA: Insufficient documentation

## 2022-12-30 MED ORDER — METHYLPREDNISOLONE 4 MG PO TBPK: ORAL_TABLET | ORAL | 0 refills | Status: DC

## 2022-12-30 NOTE — Telephone Encounter (Signed)
Patient given number to the imaging department.

## 2022-12-30 NOTE — ED Provider Notes (Signed)
[facility] Provider Note    Event Date/Time   First MD Initiated Contact with Patient 12/30/22 1802     (approximate)   History   foot drop and Post-op Problem   HPI  Adriana Lewis is a 27 y.o. female with a history of sciatica and stenosis with resultant right foot weakness and foot drop presents to the ER for recurrence of weakness to the right foot as well as some pain going down her leg.  Denies any injury.  No fevers or chills no abdominal pain.  No dysuria.  She called her neurologist and was told to come to the ER for further evaluation.     Physical Exam   Triage Vital Signs: ED Triage Vitals  Enc Vitals Group     BP 12/30/22 1703 138/80     Pulse Rate 12/30/22 1703 80     Resp 12/30/22 1703 19     Temp 12/30/22 1703 98.1 F (36.7 C)     Temp Source 12/30/22 1703 Oral     SpO2 12/30/22 1703 99 %     Weight 12/30/22 1708 251 lb 5.2 oz (114 kg)     Height 12/30/22 1708 5\' 7"  (1.702 m)     Head Circumference --      Peak Flow --      Pain Score --      Pain Loc --      Pain Edu? --      Excl. in GC? --     Most recent vital signs: Vitals:   12/30/22 1703  BP: 138/80  Pulse: 80  Resp: 19  Temp: 98.1 F (36.7 C)  SpO2: 99%     Constitutional: Alert  Eyes: Conjunctivae are normal.  Head: Atraumatic. Nose: No congestion/rhinnorhea. Mouth/Throat: Mucous membranes are moist.   Neck: Painless ROM.  Cardiovascular:   Good peripheral circulation. Respiratory: Normal respiratory effort.  No retractions.  Gastrointestinal: Soft and nontender.  Musculoskeletal:  no deformity Neurologic:  MAE spontaneously. No gross focal neurologic deficits are appreciated.  3-5 strength with dorsiflexion and eversion.  Subjective decreased sensation of the foot to mid calf. Skin:  Skin is warm, dry and intact. No rash noted. Psychiatric: Mood and affect are normal. Speech and behavior are normal.    ED Results / Procedures /  Treatments   Labs (all labs ordered are listed, but only abnormal results are displayed) Labs Reviewed - No data to display   EKG     RADIOLOGY Please see ED Course for my review and interpretation.  I personally reviewed all radiographic images ordered to evaluate for the above acute complaints and reviewed radiology reports and findings.  These findings were personally discussed with the patient.  Please see medical record for radiology report.    PROCEDURES:  Critical Care performed: No  Procedures   MEDICATIONS ORDERED IN ED: Medications - No data to display   IMPRESSION / MDM / ASSESSMENT AND PLAN / ED COURSE  I reviewed the triage vital signs and the nursing notes.                              Differential diagnosis includes, but is not limited to, radiculopathy, spinal stenosis, cord compression, cauda equina, sciatica,  Patient presented to the ER for evaluation symptoms as described above.  Exam appears to be consistent with previous notes.  Patient reporting significant weakness subjectively and pain.  Will  consult neurosurgery.  Does not appear consistent with infectious process.  Doubt intra-abdominal process.   Clinical Course as of 12/30/22 2011  Tue Dec 30, 2022  1836 Discussed case in consultation with Dr. Myer Haff of neurosurgery.  Patient is requesting MRI here in the ER given sudden change in pain. [PR]  2008 MRI resulted without acute finding.  Patient remains well-appearing in no acute distress.  Will give Medrol Dosepak.  Do not feel that further diagnostic testing clinically indicated given otherwise reassuring and stable exam.  Appropriate for outpatient follow-up. [PR]    Clinical Course User Index [PR] Willy Eddy, MD     FINAL CLINICAL IMPRESSION(S) / ED DIAGNOSES   Final diagnoses:  Foot drop, right foot     Rx / DC Orders   ED Discharge Orders          Ordered    methylPREDNISolone (MEDROL DOSEPAK) 4 MG TBPK tablet         12/30/22 2010             Note:  This document was prepared using Dragon voice recognition software and may include unintentional dictation errors.    Willy Eddy, MD 12/30/22 2011

## 2022-12-30 NOTE — ED Triage Notes (Signed)
Pt to ED for R foot drop since this morning after previous L4 L5 spinal surgery in March 2024. Foot drop had previously resolved since 2 weeks post op. Pt saw Dr Clelia Croft neurologist today who said to be evaluated in ED today and not to wait for scheduled MRI on Saturday. Pt also experiencing increasing pain and tingling, numbness to same leg since same time. Pt is alert, oriented with no focal neuro deficits.

## 2022-12-30 NOTE — Discharge Instructions (Signed)

## 2022-12-30 NOTE — Telephone Encounter (Signed)
Please let the patient know that Duwayne Heck wants her to have the MRI ASAP. Please give her the number to scheduling so she can get this scheduled.

## 2022-12-30 NOTE — Telephone Encounter (Signed)
Pt called this morning to report "foot drop is back but no pain"  CB: [phone]

## 2022-12-31 ENCOUNTER — Telehealth: Payer: Self-pay

## 2022-12-31 NOTE — Telephone Encounter (Signed)
-----   Message from Venetia Night, MD sent at 12/31/2022  7:00 AM EDT ----- Her mri was reassuring.  No need to intervention.  Sent her out from ER with steroids I believe.  She should be able to focus on rehabbing her lower leg to try to improve strength.

## 2023-01-03 ENCOUNTER — Ambulatory Visit: Payer: BC Managed Care – PPO

## 2023-01-27 ENCOUNTER — Encounter: Payer: BC Managed Care – PPO | Admitting: Orthopedic Surgery

## 2023-01-27 ENCOUNTER — Encounter: Payer: Self-pay | Admitting: Neurosurgery

## 2023-01-27 ENCOUNTER — Ambulatory Visit (INDEPENDENT_AMBULATORY_CARE_PROVIDER_SITE_OTHER): Payer: Self-pay | Admitting: Neurosurgery

## 2023-01-27 VITALS — BP 118/78 | Temp 98.3°F | Ht 67.0 in | Wt 253.0 lb

## 2023-01-27 DIAGNOSIS — M5416 Radiculopathy, lumbar region: Secondary | ICD-10-CM

## 2023-01-27 DIAGNOSIS — Z09 Encounter for follow-up examination after completed treatment for conditions other than malignant neoplasm: Secondary | ICD-10-CM

## 2023-01-27 DIAGNOSIS — M5126 Other intervertebral disc displacement, lumbar region: Secondary | ICD-10-CM

## 2023-01-27 DIAGNOSIS — Z9889 Other specified postprocedural states: Secondary | ICD-10-CM

## 2023-01-27 NOTE — Progress Notes (Signed)
REFERRING PHYSICIAN:  Judie Petit, [place] [address] [place],  [place] [address]  DOS: 11/03/22 right L4-5 microdisectomy   HISTORY OF PRESENT ILLNESS: Adriana Lewis is status post her discectomy. 01/27/23 Since her last visit she has presented to the ER with recurrent right radiating leg pain and weakness on 12/30/2022.  She had an updated MRI which was without explanation.  She was given a Medrol Dosepak.  In the interim she states that her right rating leg pain has completely resolved and her foot weakness has improved significantly.  She is doing very well and is without concerns today.  12/16/22 Dr. Myer Haff Her pain is improved compared to preop.  She is still having numbness in her right lower leg and on her foot.  She still having some weakness.  She continues to have some pain down her leg even though it is better than it was prior to surgery.  She particularly notices this when she sits in 1 position for some period of time.  When she changes positions, her pain is better.   PHYSICAL EXAMINATION:  General: Patient is well developed, well nourished, calm, collected, and in no apparent distress.   NEUROLOGICAL:  General: In no acute distress.   Awake, alert, oriented to person, place, and time.  Pupils equal round and reactive to light.  Facial tone is symmetric.    Strength:            Side Iliopsoas Quads Hamstring PF DF EHL  R 5 5 5  4+ 4- 4-  L 5 5 5 5 5 5     ROS (Neurologic):  Negative except as noted above  IMAGING: MRI L spine 12/30/22 Narrative & Impression  CLINICAL DATA:  Initial evaluation for low back pain, cauda equina syndrome suspected.   EXAM: MRI LUMBAR SPINE WITHOUT CONTRAST   TECHNIQUE: Multiplanar, multisequence MR imaging of the lumbar spine was performed. No intravenous contrast was administered.   COMPARISON:  Radiograph from 11/03/22 as well as prior MRI from 11/02/2022.   FINDINGS: Segmentation: Standard. Lowest  well-formed disc space labeled the L5-S1 level.   Alignment: Trace dextroscoliosis. Alignment otherwise normal preservation of the normal lumbar lordosis. No listhesis.   Vertebrae: Vertebral body height maintained without acute or chronic fracture. Bone marrow signal intensity within normal limits. No worrisome osseous lesions. Reactive endplate change present about the L4-5 interspace, worse on the right. No other abnormal marrow edema.   Conus medullaris and cauda equina: Conus extends to the L1 level. Conus and cauda equina appear normal.   Paraspinal and other soft tissues: Postoperative changes from prior right posterior decompression at L4-5 noted within the lower posterior paraspinous soft tissues. Small 2 cm residual collection along the lower midline incision most likely reflects a small residual seroma (series 8, image 20). Paraspinous soft tissues demonstrate no other acute finding. 1.7 cm simple cyst present within the interpolar right kidney, benign in appearance, no follow-up imaging recommended.   Disc levels:   T11-12: Left paracentral disc protrusion indents the ventral thecal sac (series 8, image 3). Secondary flattening of the left hemi cord without cord signal changes. Mild spinal stenosis. Foramina remain patent.   T12-L1: Unremarkable.   L1-2:  Unremarkable.   L2-3: Mild annular disc bulge. Superimposed small chronic endplate Schmorl's node deformities. No spinal stenosis. Foramina remain patent.   L3-4: Tiny endplate Schmorl's node deformity without significant disc bulge. Mild facet hypertrophy. No spinal stenosis. Foramina remain patent.   L4-5: Disc desiccation with  mild disc bulge. Associated mild reactive endplate change with marginal endplate osteophytic spurring. Postoperative changes from prior right hemi laminectomy with micro discectomy. Tiny residual and/or recurrent central disc protrusion with annular fissure minimally indents the  ventral thecal sac (series 8, image 33). Mild facet hypertrophy. No significant residual spinal stenosis. Mild bilateral L4 foraminal narrowing, stable.   L5-S1: Normal interspace. Mild to moderate facet hypertrophy. No spinal stenosis. Foramina remain patent.   IMPRESSION: 1. Postoperative changes from prior right hemi laminectomy with micro discectomy at L4-5. Tiny residual and/or recurrent central disc protrusion at this level without significant stenosis or residual impingement. 2. Left paracentral disc protrusion at T11-12 with secondary flattening of the left hemi cord and mild spinal stenosis. 3. Mild to moderate bilateral facet hypertrophy at L3-4 through L5-S1. 4. No other acute abnormality within the lumbar spine. No frank cord compression.     Electronically Signed   By: Rise Mu M.D.   On: 12/30/2022 19:59    ASSESSMENT/PLAN:  Adriana Lewis is doing well after her discectomy.  While she does still have some persistent numbness in her right lateral calf and some subtle distal foot weakness, she has made significant improvement since her last visit.  We will hold off on the EMG discussed at her last visit.  I would like for her to return to the office in 3 months to see Dr. Myer Haff.  She was encouraged to call in the interim should she have any questions or concerns.  She expressed understanding and was in agreement with this plan.  Manning Charity PA-C Department of neurosurgery

## 2023-04-28 ENCOUNTER — Ambulatory Visit: Payer: Self-pay | Admitting: Neurosurgery

## 2023-05-19 ENCOUNTER — Encounter: Payer: Self-pay | Admitting: Neurosurgery

## 2023-07-28 ENCOUNTER — Ambulatory Visit: Payer: BC Managed Care – PPO

## 2023-07-28 ENCOUNTER — Ambulatory Visit
Admission: EM | Admit: 2023-07-28 | Discharge: 2023-07-28 | Disposition: A | Discharge status: Home / Self Care | Payer: BC Managed Care – PPO | Attending: Emergency Medicine | Admitting: Emergency Medicine

## 2023-07-28 DIAGNOSIS — Z8619 Personal history of other infectious and parasitic diseases: Secondary | ICD-10-CM

## 2023-07-28 DIAGNOSIS — R058 Other specified cough: Secondary | ICD-10-CM

## 2023-07-28 DIAGNOSIS — J189 Pneumonia, unspecified organism: Secondary | ICD-10-CM

## 2023-07-28 MED ORDER — FLUCONAZOLE 150 MG PO TABS: 150.0000 mg | ORAL_TABLET | Freq: Once | ORAL | 0 refills | Status: AC

## 2023-07-28 MED ORDER — AEROCHAMBER PLUS FLO-VU MEDIUM MISC
1.0000 | Freq: Once | Status: AC
  Administered 2023-07-28: 1

## 2023-07-28 MED ORDER — ALBUTEROL SULFATE HFA 108 (90 BASE) MCG/ACT IN AERS
2.0000 | INHALATION_SPRAY | Freq: Once | RESPIRATORY_TRACT | Status: AC
  Administered 2023-07-28: 2 via RESPIRATORY_TRACT

## 2023-07-28 MED ORDER — CEFDINIR 300 MG PO CAPS: 300.0000 mg | ORAL_CAPSULE | Freq: Two times a day (BID) | ORAL | 0 refills | Status: AC

## 2023-07-28 MED ORDER — AZITHROMYCIN 250 MG PO TABS: 250.0000 mg | ORAL_TABLET | Freq: Every day | ORAL | 0 refills | Status: DC

## 2023-07-28 NOTE — ED Provider Notes (Signed)
[name]    CSN: [id number] Arrival date & time: 07/28/23  1542      History   Chief Complaint Chief Complaint  Patient presents with   Cough    HPI Adriana Lewis is a 27 y.o. female.  Patient presents with ongoing congestion and cough x 3 to 4 weeks.  These symptoms improved with recent antibiotic and steroid but then worsened again 1 week ago.  She reports shortness of breath with coughing episodes.  She reports vomiting once on 07/25/2023 after coughing but none since.  She denies fever, diarrhea, or other symptoms.  Patient was seen at [facility] clinic on 07/09/2023; diagnosed with acute sinusitis; treated with Augmentin, prednisone, Flonase nasal spray, Promethazine DM.  The history is provided by the patient and medical records.    Past Medical History:  Diagnosis Date   Anxiety    Irregular heartbeat    pt reports history of palpitations    Patient Active Problem List   Diagnosis Date Noted   Lumbar herniated disc 11/03/2022   Foot drop, right 11/03/2022   Lumbar radiculopathy 11/02/2022   Status post lumbar spine surgery for decompression of spinal cord (Left L4/5: 01/11/20) 08/27/2020   Lumbar radicular pain 01/11/2020   Term pregnancy 06/21/2018   SVD (spontaneous vaginal delivery) 06/21/2018   Second-degree perineal laceration, with delivery 06/21/2018   Postpartum care following vaginal delivery (11/4) 06/21/2018   Uterine contractions during pregnancy 06/20/2018   Abdominal cramping 12/22/2016   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   Ganglion and cyst of synovium, tendon, and bursa 09/25/2010    Past Surgical History:  Procedure Laterality Date   DILATION AND EVACUATION N/A 02/02/2017   Procedure: DILATATION AND EVACUATION;  Surgeon: Linzie Collin, MD;  Location: [facility] ORS;  Service: Gynecology;  Laterality: N/A;   HEMI-MICRODISCECTOMY LUMBAR LAMINECTOMY LEVEL 1 Right 11/03/2022   Procedure: HEMI-MICRODISCECTOMY LUMBAR LAMINECTOMY  LEVEL 1;  Surgeon: Venetia Night, MD;  Location: [facility] ORS;  Service: Neurosurgery;  Laterality: Right;  R L4/5 microdiscectomy   LUMBAR LAMINECTOMY/DECOMPRESSION MICRODISCECTOMY Left 01/11/2020   Procedure: LEFT L4-5 MICRODISCECTOMY;  Surgeon: Venetia Night, MD;  Location: [facility] ORS;  Service: Neurosurgery;  Laterality: Left;   TONSILLECTOMY  01/07/2022   UPPER GI ENDOSCOPY     WISDOM TOOTH EXTRACTION      OB History     Gravida  3   Para  1   Term  1   Preterm  0   AB  1   Living  1      SAB  1   IAB  0   Ectopic  0   Multiple  0   Live Births  1            Home Medications    Prior to Admission medications   Medication Sig Start Date End Date Taking? Authorizing Provider  azithromycin (ZITHROMAX) 250 MG tablet Take 1 tablet (250 mg total) by mouth daily. Take first 2 tablets together, then 1 every day until finished. 07/28/23  Yes Mickie Bail, NP  cefdinir (OMNICEF) 300 MG capsule Take 1 capsule (300 mg total) by mouth 2 (two) times daily for 7 days. 07/28/23 08/04/23 Yes Mickie Bail, NP  fluconazole (DIFLUCAN) 150 MG tablet Take 1 tablet (150 mg total) by mouth once for 1 dose. Take one tablet today.  May repeat in 3 days. 07/28/23 07/28/23 Yes Mickie Bail, NP  cyanocobalamin (VITAMIN B12) 1000 MCG tablet Take 1,000 mcg by mouth daily.  [provider]  FLUoxetine (PROZAC) 40 MG capsule Take 80 mg by mouth daily. 03/26/22   [provider]  propranolol (INDERAL) 10 MG tablet Take 10 mg by mouth as needed. 07/16/21   [provider]  traZODone (DESYREL) 50 MG tablet Take 75 mg by mouth at bedtime as needed. 08/26/22   [provider]    Family History Family History  Problem Relation Age of Onset   Diabetes Mother    Hypertension Mother    Varicose Veins Mother    Hepatitis B Father    Cirrhosis Father    Diabetes Sister    Hypertension Sister    Heart disease Maternal Grandmother    Cancer Paternal  Grandmother        stomach   Diabetes Paternal Grandfather    COPD Paternal Grandfather     Social History Social History   Tobacco Use   Smoking status: Never   Smokeless tobacco: Never  Vaping Use   Vaping status: Never Used  Substance Use Topics   Alcohol use: No    Comment: rarely 2-3 glasses of wine per year   Drug use: No     Allergies   Latex and Sulfa antibiotics   Review of Systems Review of Systems  Constitutional:  Negative for chills and fever.  HENT:  Positive for congestion. Negative for ear pain and sore throat.   Respiratory:  Positive for cough and shortness of breath.   Cardiovascular:  Negative for chest pain and palpitations.  Gastrointestinal:  Positive for vomiting. Negative for diarrhea.     Physical Exam Triage Vital Signs ED Triage Vitals  Encounter Vitals Group     BP      Systolic BP Percentile      Diastolic BP Percentile      Pulse      Resp      Temp      Temp src      SpO2      Weight      Height      Head Circumference      Peak Flow      Pain Score      Pain Loc      Pain Education      Exclude from Growth Chart    No data found.  Updated Vital Signs BP 116/82   Pulse 82   Temp 98 F (36.7 C)   Resp 18   SpO2 96%   Visual Acuity Right Eye Distance:   Left Eye Distance:   Bilateral Distance:    Right Eye Near:   Left Eye Near:    Bilateral Near:     Physical Exam Constitutional:      General: She is not in acute distress. HENT:     Right Ear: Tympanic membrane normal.     Left Ear: Tympanic membrane normal.     Nose: Congestion present.     Mouth/Throat:     Mouth: Mucous membranes are moist.     Pharynx: Oropharynx is clear.  Cardiovascular:     Rate and Rhythm: Normal rate and regular rhythm.     Heart sounds: Normal heart sounds.  Pulmonary:     Effort: Pulmonary effort is normal. No respiratory distress.     Breath sounds: Normal breath sounds. Decreased air movement present.  Skin:     General: Skin is warm and dry.  Neurological:     Mental Status: She is alert.  UC Treatments / Results  Labs (all labs ordered are listed, but only abnormal results are displayed) Labs Reviewed - No data to display  EKG   Radiology DG Chest 2 View  Result Date: 07/28/2023 CLINICAL DATA:  Productive cough and congestion for the past few weeks. EXAM: CHEST - 2 VIEW COMPARISON:  None Available. FINDINGS: The heart size and mediastinal contours are within normal limits. Normal pulmonary vascularity. Subtle increased opacity in the medial infrahilar right lung on the frontal view, likely within the lower lobe based on the lateral view. Remaining lungs are clear. No pleural effusion or pneumothorax. No acute osseous abnormality. IMPRESSION: 1. Subtle increased opacity in the medial infrahilar right lung, likely within the lower lobe, concerning for pneumonia. Electronically Signed   By: Obie Dredge M.D.   On: 07/28/2023 18:07    Procedures Procedures (including critical care time)  Medications Ordered in UC Medications  albuterol (VENTOLIN HFA) 108 (90 Base) MCG/ACT inhaler 2 puff (2 puffs Inhalation Given 07/28/23 1649)  AeroChamber Plus Flo-Vu Medium MISC 1 each (1 each Other Given 07/28/23 1649)    Initial Impression / Assessment and Plan / UC Course  I have reviewed the triage vital signs and the nursing notes.  Pertinent labs & imaging results that were available during my care of the patient were reviewed by me and considered in my medical decision making (see chart for details).    Productive cough, right lower lung pneumonia, history of vaginal candidiasis with antibiotic use.  CXR shows right lower lobe pneumonia.  Treating with cefdinir and Zithromax.  Per patient request, 1 dose of Diflucan sent to pharmacy also as patient reports she gets yeast infection when taking antibiotics.  Albuterol inhaler with spacer given here and modestly improved air movement.   Instructed her to continue using the albuterol inhaler with spacer.  Instructed her to follow-up with her PCP.  ED precautions given.  Education provided on pneumonia and cough.  She agrees to plan of care.  Final Clinical Impressions(s) / UC Diagnoses   Final diagnoses:  Productive cough  Pneumonia of right lower lobe due to infectious organism  History of candidiasis of vagina     Discharge Instructions      Take the Zithromax and use the albuterol inhaler as directed.  Follow up with your primary care provider.  Go to the emergency department if you have worsening symptoms.        ED Prescriptions     Medication Sig Dispense Auth. Provider   azithromycin (ZITHROMAX) 250 MG tablet Take 1 tablet (250 mg total) by mouth daily. Take first 2 tablets together, then 1 every day until finished. 6 tablet Mickie Bail, NP   cefdinir (OMNICEF) 300 MG capsule Take 1 capsule (300 mg total) by mouth 2 (two) times daily for 7 days. 14 capsule Mickie Bail, NP   fluconazole (DIFLUCAN) 150 MG tablet Take 1 tablet (150 mg total) by mouth once for 1 dose. Take one tablet today.  May repeat in 3 days. 1 tablet Mickie Bail, NP      PDMP not reviewed this encounter.   Mickie Bail, NP 07/28/23 [phone]

## 2023-07-28 NOTE — ED Triage Notes (Addendum)
Patient to Urgent Care with complaints of productive cough/ drainage/ congestion/ emesis/ fatigue.   Symptoms started before thanksgiving. Symptoms did not fully resolve after completing amoxicillin and steroids. Worsened again on Saturday.   Teaches first grade.

## 2023-07-28 NOTE — Discharge Instructions (Addendum)
Take the Zithromax and use the albuterol inhaler as directed.  Follow up with your primary care provider.  Go to the emergency department if you have worsening symptoms.

## 2023-11-20 ENCOUNTER — Ambulatory Visit

## 2023-11-24 ENCOUNTER — Other Ambulatory Visit: Payer: Self-pay

## 2023-11-24 ENCOUNTER — Ambulatory Visit
Admission: EM | Admit: 2023-11-24 | Discharge: 2023-11-24 | Disposition: A | Discharge status: Home / Self Care | Payer: MEDICAID | Attending: Emergency Medicine | Admitting: Emergency Medicine

## 2023-11-24 ENCOUNTER — Encounter: Payer: Self-pay | Admitting: Emergency Medicine

## 2023-11-24 DIAGNOSIS — J0100 Acute maxillary sinusitis, unspecified: Secondary | ICD-10-CM

## 2023-11-24 MED ORDER — AMOXICILLIN-POT CLAVULANATE 875-125 MG PO TABS
1.0000 | ORAL_TABLET | Freq: Two times a day (BID) | ORAL | 0 refills | Status: DC
Start: 2023-11-24 — End: 2024-04-14

## 2023-11-24 NOTE — ED Triage Notes (Signed)
 Patient presents to [facility] for evaluation of "sinus infection".  States she was sick with a viral URI last week, was starting to feel better, an thenshe woke up today with left sided facial pressure and pain and teeth pain, which is a typical symptom of her sinus infections

## 2023-11-24 NOTE — Discharge Instructions (Addendum)
 Today you are being treated for a sinus infection  Take Augmentin every morning and every evening for 7 days  You can take Tylenol and/or Ibuprofen as needed for fever reduction and pain relief.   For cough: honey 1/2 to 1 teaspoon (you can dilute the honey in water or another fluid).  You can also use guaifenesin and dextromethorphan for cough. You can use a humidifier for chest congestion and cough.  If you don't have a humidifier, you can sit in the bathroom with the hot shower running.      For sore throat: try warm salt water gargles, cepacol lozenges, throat spray, warm tea or water with lemon/honey, popsicles or ice, or OTC cold relief medicine for throat discomfort.   For congestion: take a daily anti-histamine like Zyrtec, Claritin, and a oral decongestant, such as pseudoephedrine.  You can also use Flonase 1-2 sprays in each nostril daily.   It is important to stay hydrated: drink plenty of fluids (water, gatorade/powerade/pedialyte, juices, or teas) to keep your throat moisturized and help further relieve irritation/discomfort.

## 2023-11-24 NOTE — ED Provider Notes (Signed)
 Adriana Lewis    CSN: [id number] Arrival date & time: 11/24/23  1621      History   Chief Complaint Chief Complaint  Patient presents with   Facial Pain    HPI Adriana Lewis is a 27 y.o. female.   Patient presents for evaluation of nasal congestion, rhinorrhea and a nonproductive cough present for 7 days.  Begin this experience sinus pressure primarily to the left side of the face 1 day ago, worsening today causing the teeth to ache.  Has attempted use of Sudafed, Zyrtec and Mucinex D.  Tolerating food and liquids.  No known sick contacts.  Denies shortness of breath or wheezing.  Initial fever has resolved.  Endorses history of reoccurring sinusitis.  Past Medical History:  Diagnosis Date   Anxiety    Irregular heartbeat    pt reports history of palpitations    Patient Active Problem List   Diagnosis Date Noted   Lumbar herniated disc 11/03/2022   Foot drop, right 11/03/2022   Lumbar radiculopathy 11/02/2022   Status post lumbar spine surgery for decompression of spinal cord (Left L4/5: 01/11/20) 08/27/2020   Lumbar radicular pain 01/11/2020   Term pregnancy 06/21/2018   SVD (spontaneous vaginal delivery) 06/21/2018   Second-degree perineal laceration, with delivery 06/21/2018   Postpartum care following vaginal delivery (11/4) 06/21/2018   Uterine contractions during pregnancy 06/20/2018   Abdominal cramping 12/22/2016   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   Ganglion and cyst of synovium, tendon, and bursa 09/25/2010    Past Surgical History:  Procedure Laterality Date   DILATION AND EVACUATION N/A 02/02/2017   Procedure: DILATATION AND EVACUATION;  Surgeon: Linzie Collin, MD;  Location: [facility];  Service: Gynecology;  Laterality: N/A;   HEMI-MICRODISCECTOMY LUMBAR LAMINECTOMY LEVEL 1 Right 11/03/2022   Procedure: HEMI-MICRODISCECTOMY LUMBAR LAMINECTOMY LEVEL 1;  Surgeon: Venetia Night, MD;  Location: [facility];  Service: Neurosurgery;   Laterality: Right;  R L4/5 microdiscectomy   LUMBAR LAMINECTOMY/DECOMPRESSION MICRODISCECTOMY Left 01/11/2020   Procedure: LEFT L4-5 MICRODISCECTOMY;  Surgeon: Venetia Night, MD;  Location: [facility];  Service: Neurosurgery;  Laterality: Left;   TONSILLECTOMY  01/07/2022   UPPER GI ENDOSCOPY     WISDOM TOOTH EXTRACTION      OB History     Gravida  3   Para  1   Term  1   Preterm  0   AB  1   Living  1      SAB  1   IAB  0   Ectopic  0   Multiple  0   Live Births  1            Home Medications    Prior to Admission medications   Medication Sig Start Date End Date Taking? Authorizing Provider  amoxicillin-clavulanate (AUGMENTIN) 875-125 MG tablet Take 1 tablet by mouth every 12 (twelve) hours. 11/24/23  Yes Rainn Bullinger R, NP  azithromycin (ZITHROMAX) 250 MG tablet Take 1 tablet (250 mg total) by mouth daily. Take first 2 tablets together, then 1 every day until finished. 07/28/23   Mickie Bail, NP  cyanocobalamin (VITAMIN B12) 1000 MCG tablet Take 1,000 mcg by mouth daily.    [provider]  FLUoxetine (PROZAC) 40 MG capsule Take 80 mg by mouth daily. 03/26/22   [provider]  propranolol (INDERAL) 10 MG tablet Take 10 mg by mouth as needed. 07/16/21   [provider]  traZODone (DESYREL) 50 MG tablet Take 75 mg by mouth  at bedtime as needed. 08/26/22   [provider]    Family History Family History  Problem Relation Age of Onset   Diabetes Mother    Hypertension Mother    Varicose Veins Mother    Hepatitis B Father    Cirrhosis Father    Diabetes Sister    Hypertension Sister    Heart disease Maternal Grandmother    Cancer Paternal Grandmother        stomach   Diabetes Paternal Grandfather    COPD Paternal Grandfather     Social History Social History   Tobacco Use   Smoking status: Never   Smokeless tobacco: Never  Vaping Use   Vaping status: Never Used  Substance Use Topics   Alcohol use: No     Comment: rarely 2-3 glasses of wine per year   Drug use: No     Allergies   Latex and Sulfa antibiotics   Review of Systems Review of Systems   Physical Exam Triage Vital Signs ED Triage Vitals [11/24/23 1638]  Encounter Vitals Group     BP 122/81     Systolic BP Percentile      Diastolic BP Percentile      Pulse Rate 78     Resp 18     Temp 98.9 F (37.2 C)     Temp Source Oral     SpO2 98 %     Weight      Height      Head Circumference      Peak Flow      Pain Score 4     Pain Loc      Pain Education      Exclude from Growth Chart    No data found.  Updated Vital Signs BP 122/81 (BP Location: Left Arm)   Pulse 78   Temp 98.9 F (37.2 C) (Oral)   Resp 18   SpO2 98%   Visual Acuity Right Eye Distance:   Left Eye Distance:   Bilateral Distance:    Right Eye Near:   Left Eye Near:    Bilateral Near:     Physical Exam Constitutional:      Appearance: Normal appearance.  HENT:     Head: Normocephalic.     Right Ear: Tympanic membrane, ear canal and external ear normal.     Left Ear: Tympanic membrane, ear canal and external ear normal.     Nose: Congestion present.     Mouth/Throat:     Pharynx: No oropharyngeal exudate or posterior oropharyngeal erythema.  Eyes:     Extraocular Movements: Extraocular movements intact.  Cardiovascular:     Rate and Rhythm: Normal rate and regular rhythm.     Pulses: Normal pulses.     Heart sounds: Normal heart sounds.  Pulmonary:     Effort: Pulmonary effort is normal.     Breath sounds: Normal breath sounds.  Neurological:     Mental Status: She is alert and oriented to person, place, and time.      UC Treatments / Results  Labs (all labs ordered are listed, but only abnormal results are displayed) Labs Reviewed - No data to display  EKG   Radiology No results found.  Procedures Procedures (including critical care time)  Medications Ordered in UC Medications - No data to display  Initial  Impression / Assessment and Plan / UC Course  I have reviewed the triage vital signs and the nursing notes.  Pertinent labs &  imaging results that were available during my care of the patient were reviewed by me and considered in my medical decision making (see chart for details).  Acute nonrecurrent maxillary sinusitis  Patient is in no signs of distress nor toxic appearing.  Vital signs are stable.  Low suspicion for pneumonia, pneumothorax or bronchitis and therefore will defer imaging.  Viral testing deferred due to timeline of illness, symptomology consistent with a sinus illness per chart review history of present will provide bacterial coverage, prescribed Augmentin, declined course of steroids. May use additional over-the-counter medications as needed for supportive care.  May follow-up with urgent care as needed if symptoms persist or worsen.  Final Clinical Impressions(s) / UC Diagnoses   Final diagnoses:  Acute non-recurrent maxillary sinusitis     Discharge Instructions      Today you are being treated for a sinus infection  Take Augmentin every morning and every evening for 7 days  You can take Tylenol and/or Ibuprofen as needed for fever reduction and pain relief.   For cough: honey 1/2 to 1 teaspoon (you can dilute the honey in water or another fluid).  You can also use guaifenesin and dextromethorphan for cough. You can use a humidifier for chest congestion and cough.  If you don't have a humidifier, you can sit in the bathroom with the hot shower running.      For sore throat: try warm salt water gargles, cepacol lozenges, throat spray, warm tea or water with lemon/honey, popsicles or ice, or OTC cold relief medicine for throat discomfort.   For congestion: take a daily anti-histamine like Zyrtec, Claritin, and a oral decongestant, such as pseudoephedrine.  You can also use Flonase 1-2 sprays in each nostril daily.   It is important to stay hydrated: drink plenty of  fluids (water, gatorade/powerade/pedialyte, juices, or teas) to keep your throat moisturized and help further relieve irritation/discomfort.    ED Prescriptions     Medication Sig Dispense Auth. Provider   amoxicillin-clavulanate (AUGMENTIN) 875-125 MG tablet Take 1 tablet by mouth every 12 (twelve) hours. 14 tablet Shareka Casale, Elita Boone, NP      PDMP not reviewed this encounter.   Valinda Hoar, [place] 11/24/23 [phone]

## 2024-01-14 ENCOUNTER — Ambulatory Visit
Admission: EM | Admit: 2024-01-14 | Discharge: 2024-01-14 | Disposition: A | Discharge status: Home / Self Care | Attending: Emergency Medicine | Admitting: Emergency Medicine

## 2024-01-14 DIAGNOSIS — J069 Acute upper respiratory infection, unspecified: Secondary | ICD-10-CM | POA: Diagnosis not present

## 2024-01-14 LAB — POCT RAPID STREP A (OFFICE): Rapid Strep A Screen: NEGATIVE

## 2024-01-14 NOTE — ED Triage Notes (Signed)
 Horse voice, cough, sore throat x 2 days. Pt states she has recently been around around kids that are positive for strep. Taking tylenol .

## 2024-01-14 NOTE — Discharge Instructions (Addendum)
 Your symptoms today are most likely being caused by a virus and should steadily improve in time it can take up to 7 to 10 days before you truly start to see a turnaround however things will get better  Test is negative, has been sent to the lab to see if bacteria will grow, if this occurs you will be notified and started on antibiotics    You can take Tylenol  and/or Ibuprofen  as needed for fever reduction and pain relief.   For cough: honey 1/2 to 1 teaspoon (you can dilute the honey in water or another fluid).  You can also use guaifenesin and dextromethorphan for cough. You can use a humidifier for chest congestion and cough.  If you don't have a humidifier, you can sit in the bathroom with the hot shower running.      For sore throat: try warm salt water gargles, cepacol lozenges, throat spray, warm tea or water with lemon/honey, popsicles or ice, or OTC cold relief medicine for throat discomfort.   For congestion: take a daily anti-histamine like Zyrtec , Claritin, and a oral decongestant, such as pseudoephedrine.  You can also use Flonase 1-2 sprays in each nostril daily.   It is important to stay hydrated: drink plenty of fluids (water, gatorade/powerade/pedialyte, juices, or teas) to keep your throat moisturized and help further relieve irritation/discomfort.

## 2024-01-14 NOTE — ED Provider Notes (Signed)
 Adriana Lewis    CSN: [id number] Arrival date & time: 01/14/24  1454      History   Chief Complaint Chief Complaint  Patient presents with   Sore Throat   Cough    HPI Adriana Lewis is a 28 y.o. female.   Patient presents for evaluation of a frontal headache, bilateral ear pressure, sore throat, nasal congestion and a cough present for 2 days.  Known exposure to multiple people with strep, works as a Runner, broadcasting/film/video.  Decreased appetite but able to tolerate food and liquids.  Has taken Tylenol .  Denies fever, shortness of breath or wheezing.  Past Medical History:  Diagnosis Date   Anxiety    Irregular heartbeat    pt reports history of palpitations    Patient Active Problem List   Diagnosis Date Noted   Lumbar herniated disc 11/03/2022   Foot drop, right 11/03/2022   Lumbar radiculopathy 11/02/2022   Status post lumbar spine surgery for decompression of spinal cord (Left L4/5: 01/11/20) 08/27/2020   Lumbar radicular pain 01/11/2020   Term pregnancy 06/21/2018   SVD (spontaneous vaginal delivery) 06/21/2018   Second-degree perineal laceration, with delivery 06/21/2018   Postpartum care following vaginal delivery (11/4) 06/21/2018   Uterine contractions during pregnancy 06/20/2018   Abdominal cramping 12/22/2016   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   Ganglion and cyst of synovium, tendon, and bursa 09/25/2010    Past Surgical History:  Procedure Laterality Date   DILATION AND EVACUATION N/A 02/02/2017   Procedure: DILATATION AND EVACUATION;  Surgeon: Zenobia Hila, MD;  Location: [facility];  Service: Gynecology;  Laterality: N/A;   HEMI-MICRODISCECTOMY LUMBAR LAMINECTOMY LEVEL 1 Right 11/03/2022   Procedure: HEMI-MICRODISCECTOMY LUMBAR LAMINECTOMY LEVEL 1;  Surgeon: Jodeen Munch, MD;  Location: [facility];  Service: Neurosurgery;  Laterality: Right;  R L4/5 microdiscectomy   LUMBAR LAMINECTOMY/DECOMPRESSION MICRODISCECTOMY Left 01/11/2020    Procedure: LEFT L4-5 MICRODISCECTOMY;  Surgeon: Jodeen Munch, MD;  Location: [facility];  Service: Neurosurgery;  Laterality: Left;   TONSILLECTOMY  01/07/2022   UPPER GI ENDOSCOPY     WISDOM TOOTH EXTRACTION      OB History     Gravida  3   Para  1   Term  1   Preterm  0   AB  1   Living  1      SAB  1   IAB  0   Ectopic  0   Multiple  0   Live Births  1            Home Medications    Prior to Admission medications   Medication Sig Start Date End Date Taking? Authorizing Provider  cyanocobalamin (VITAMIN B12) 1000 MCG tablet Take 1,000 mcg by mouth daily.   Yes [provider]  amoxicillin -clavulanate (AUGMENTIN ) 875-125 MG tablet Take 1 tablet by mouth every 12 (twelve) hours. 11/24/23   Reena Canning, NP  azithromycin  (ZITHROMAX ) 250 MG tablet Take 1 tablet (250 mg total) by mouth daily. Take first 2 tablets together, then 1 every day until finished. 07/28/23   Wellington Half, NP  FLUoxetine  (PROZAC ) 40 MG capsule Take 80 mg by mouth daily. 03/26/22   [provider]  propranolol  (INDERAL ) 10 MG tablet Take 10 mg by mouth as needed. 07/16/21   [provider]  traZODone  (DESYREL ) 50 MG tablet Take 75 mg by mouth at bedtime as needed. 08/26/22   [provider]    Family History Family History  Problem  Relation Age of Onset   Diabetes Mother    Hypertension Mother    Varicose Veins Mother    Hepatitis B Father    Cirrhosis Father    Diabetes Sister    Hypertension Sister    Heart disease Maternal Grandmother    Cancer Paternal Grandmother        stomach   Diabetes Paternal Grandfather    COPD Paternal Grandfather     Social History Social History   Tobacco Use   Smoking status: Never   Smokeless tobacco: Never  Vaping Use   Vaping status: Never Used  Substance Use Topics   Alcohol use: No    Comment: rarely 2-3 glasses of wine per year   Drug use: No     Allergies   Latex and Sulfa  antibiotics   Review of Systems Review of Systems   Physical Exam Triage Vital Signs ED Triage Vitals  Encounter Vitals Group     BP 01/14/24 1516 123/82     Systolic BP Percentile --      Diastolic BP Percentile --      Pulse Rate 01/14/24 1516 85     Resp 01/14/24 1516 16     Temp 01/14/24 1516 98.9 F (37.2 C)     Temp Source 01/14/24 1516 Oral     SpO2 01/14/24 1516 98 %     Weight --      Height --      Head Circumference --      Peak Flow --      Pain Score 01/14/24 1518 3     Pain Loc --      Pain Education --      Exclude from Growth Chart --    No data found.  Updated Vital Signs BP 123/82 (BP Location: Left Arm)   Pulse 85   Temp 98.9 F (37.2 C) (Oral)   Resp 16   LMP  (LMP Unknown)   SpO2 98%   Visual Acuity Right Eye Distance:   Left Eye Distance:   Bilateral Distance:    Right Eye Near:   Left Eye Near:    Bilateral Near:     Physical Exam Constitutional:      Appearance: Normal appearance.  HENT:     Head: Normocephalic.     Right Ear: Tympanic membrane, ear canal and external ear normal.     Left Ear: Tympanic membrane, ear canal and external ear normal.     Nose: Congestion present.     Mouth/Throat:     Pharynx: Posterior oropharyngeal erythema present. No oropharyngeal exudate.  Eyes:     Extraocular Movements: Extraocular movements intact.  Cardiovascular:     Rate and Rhythm: Normal rate and regular rhythm.     Pulses: Normal pulses.     Heart sounds: Normal heart sounds.  Pulmonary:     Effort: Pulmonary effort is normal.     Breath sounds: Normal breath sounds.  Musculoskeletal:     Cervical back: Normal range of motion and neck supple.  Neurological:     Mental Status: She is alert and oriented to person, place, and time. Mental status is at baseline.      UC Treatments / Results  Labs (all labs ordered are listed, but only abnormal results are displayed) Labs Reviewed  POCT RAPID STREP A (OFFICE) - Normal   CULTURE, GROUP A STREP [facility])    EKG   Radiology No results found.  Procedures Procedures (including critical  care time)  Medications Ordered in UC Medications - No data to display  Initial Impression / Assessment and Plan / UC Course  I have reviewed the triage vital signs and the nursing notes.  Pertinent labs & imaging results that were available during my care of the patient were reviewed by me and considered in my medical decision making (see chart for details).  Viral URI with cough  Patient is in no signs of distress nor toxic appearing.  Vital signs are stable.  Low suspicion for pneumonia, pneumothorax or bronchitis and therefore will defer imaging.  Strep testing negative, sent for culture.May use additional over-the-counter medications as needed for supportive care.  May follow-up with urgent care as needed if symptoms persist or worsen.  Final Clinical Impressions(s) / UC Diagnoses   Final diagnoses:  Viral URI with cough   Discharge Instructions      Your symptoms today are most likely being caused by a virus and should steadily improve in time it can take up to 7 to 10 days before you truly start to see a turnaround however things will get better  Test is negative, has been sent to the lab to see if bacteria will grow, if this occurs you will be notified and started on antibiotics    You can take Tylenol  and/or Ibuprofen  as needed for fever reduction and pain relief.   For cough: honey 1/2 to 1 teaspoon (you can dilute the honey in water or another fluid).  You can also use guaifenesin and dextromethorphan for cough. You can use a humidifier for chest congestion and cough.  If you don't have a humidifier, you can sit in the bathroom with the hot shower running.      For sore throat: try warm salt water gargles, cepacol lozenges, throat spray, warm tea or water with lemon/honey, popsicles or ice, or OTC cold relief medicine for throat discomfort.   For  congestion: take a daily anti-histamine like Zyrtec , Claritin, and a oral decongestant, such as pseudoephedrine.  You can also use Flonase 1-2 sprays in each nostril daily.   It is important to stay hydrated: drink plenty of fluids (water, gatorade/powerade/pedialyte, juices, or teas) to keep your throat moisturized and help further relieve irritation/discomfort.   ED Prescriptions   None    PDMP not reviewed this encounter.   Reena Canning, NP 01/14/24 1600

## 2024-01-18 ENCOUNTER — Ambulatory Visit (HOSPITAL_COMMUNITY): Payer: Self-pay

## 2024-01-18 LAB — CULTURE, GROUP A STREP (THRC)

## 2024-04-14 ENCOUNTER — Ambulatory Visit
Admission: RE | Admit: 2024-04-14 | Discharge: 2024-04-14 | Disposition: A | Discharge status: Home / Self Care | Source: Ambulatory Visit | Attending: Emergency Medicine | Admitting: Emergency Medicine

## 2024-04-14 VITALS — BP 138/85 | HR 94 | Temp 98.3°F | Wt 260.7 lb

## 2024-04-14 DIAGNOSIS — T7840XA Allergy, unspecified, initial encounter: Secondary | ICD-10-CM

## 2024-04-14 MED ORDER — DEXAMETHASONE SODIUM PHOSPHATE 10 MG/ML IJ SOLN
10.0000 mg | Freq: Once | INTRAMUSCULAR | Status: AC
  Administered 2024-04-14: 10 mg via INTRAMUSCULAR

## 2024-04-14 MED ORDER — PREDNISONE 10 MG (21) PO TBPK: ORAL_TABLET | ORAL | 0 refills | Status: DC

## 2024-04-14 MED ORDER — FAMOTIDINE 20 MG PO TABS
20.0000 mg | ORAL_TABLET | Freq: Once | ORAL | Status: AC
  Administered 2024-04-14: 20 mg via ORAL

## 2024-04-14 NOTE — Discharge Instructions (Addendum)
 Take over-the-counter Allegra 180 mg daily or Zyrtec  or Claritin 10 mg daily to help with your itching.  You can take over-the-counter Benadryl , 50 mg at bedtime, as needed for itching and sleep.  Take the prednisone  pack according to the package instructions.  You will taken on tapering dose over a period of 6 days.  Take it with food and always take it first in the morning with breakfast.  Take over-the-counter Pepcid  20 mg twice daily to help with itching as well.  If you develop any swelling of your lips or tongue, tightness in your throat, or difficulty breathing you need to go to the ER for evaluation.  Also, if you have any worsening of swelling to your left arm or hand or your fingers drawl up more and you need to go to the ER for evaluation.

## 2024-04-14 NOTE — ED Triage Notes (Addendum)
 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  Pt has taken benadryl  for the symptoms and it has not helped the itching or the hives.  Pt states that she was stung by an insect on Sunday on the right hand, and symptoms started on Wednesday.  Pt states that she now has hives along the back of her legs and arms  Pt denies change of fabric softner, soaps, lotions, foods, or outdoor activities.

## 2024-04-14 NOTE — ED Provider Notes (Signed)
 [facility]    CSN: [id number] Arrival date & time: 04/14/24  1547      History   Chief Complaint Chief Complaint  Patient presents with   Rash    HPI Adriana Lewis is a 28 y.o. female.   HPI  28 year old female presents for evaluation of possible allergic reaction.  She reports that Sunday she was walking at [employer] and she felt something bite her on her right thumb.  There is little bit of redness and swelling to her thumb at the time but that resolved.  Yesterday when she woke up and she had swelling and redness to both of her eyes, itching to her elbows, and swelling to her left hand along with numbness.  Also rash on the back of both legs.  She has been taking Benadryl  to help with the symptoms which has helped with itching or rash.  She also reports that her left hand feels very tight and stiff and she is unable to fully grip with her left hand or extend her fingers.  She denies any new personal hygiene products, laundry detergents, soaps, meds, supplements, or exposures.  She denies respiratory symptoms.  Past Medical History:  Diagnosis Date   Anxiety    Irregular heartbeat    pt reports history of palpitations    Patient Active Problem List   Diagnosis Date Noted   Lumbar herniated disc 11/03/2022   Foot drop, right 11/03/2022   Lumbar radiculopathy 11/02/2022   Status post lumbar spine surgery for decompression of spinal cord (Left L4/5: 01/11/20) 08/27/2020   Lumbar radicular pain 01/11/2020   Term pregnancy 06/21/2018   SVD (spontaneous vaginal delivery) 06/21/2018   Second-degree perineal laceration, with delivery 06/21/2018   Postpartum care following vaginal delivery (11/4) 06/21/2018   Uterine contractions during pregnancy 06/20/2018   Abdominal cramping 12/22/2016   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   Ganglion and cyst of synovium, tendon, and bursa 09/25/2010    Past Surgical History:  Procedure Laterality Date   DILATION AND  EVACUATION N/A 02/02/2017   Procedure: DILATATION AND EVACUATION;  Surgeon: Janit Alm Agent, MD;  Location: [facility];  Service: Gynecology;  Laterality: N/A;   HEMI-MICRODISCECTOMY LUMBAR LAMINECTOMY LEVEL 1 Right 11/03/2022   Procedure: HEMI-MICRODISCECTOMY LUMBAR LAMINECTOMY LEVEL 1;  Surgeon: Clois Fret, MD;  Location: [facility];  Service: Neurosurgery;  Laterality: Right;  R L4/5 microdiscectomy   LUMBAR LAMINECTOMY/DECOMPRESSION MICRODISCECTOMY Left 01/11/2020   Procedure: LEFT L4-5 MICRODISCECTOMY;  Surgeon: Clois Fret, MD;  Location: [facility];  Service: Neurosurgery;  Laterality: Left;   TONSILLECTOMY  01/07/2022   UPPER GI ENDOSCOPY     WISDOM TOOTH EXTRACTION      OB History     Gravida  3   Para  1   Term  1   Preterm  0   AB  1   Living  1      SAB  1   IAB  0   Ectopic  0   Multiple  0   Live Births  1            Home Medications    Prior to Admission medications   Medication Sig Start Date End Date Taking? Authorizing Provider  cyanocobalamin (VITAMIN B12) 1000 MCG tablet Take 1,000 mcg by mouth daily.   Yes [provider]  predniSONE  (STERAPRED UNI-PAK 21 TAB) 10 MG (21) TBPK tablet Take 6 tablets on day 1, 5 tablets day 2, 4 tablets day 3, 3 tablets day  4, 2 tablets day 5, 1 tablet day 6 04/14/24  Yes Bernardino Ditch, NP    Family History Family History  Problem Relation Age of Onset   Diabetes Mother    Hypertension Mother    Varicose Veins Mother    Hepatitis B Father    Cirrhosis Father    Diabetes Sister    Hypertension Sister    Heart disease Maternal Grandmother    Cancer Paternal Grandmother        stomach   Diabetes Paternal Grandfather    COPD Paternal Grandfather     Social History Social History   Tobacco Use   Smoking status: Never   Smokeless tobacco: Never  Vaping Use   Vaping status: Never Used  Substance Use Topics   Alcohol use: No    Comment: rarely 2-3 glasses of wine per year    Drug use: No     Allergies   Latex and Sulfa antibiotics   Review of Systems Review of Systems  HENT:  Negative for trouble swallowing and voice change.   Respiratory:  Negative for shortness of breath, wheezing and stridor.   Musculoskeletal:  Positive for joint swelling.  Skin:  Positive for rash.     Physical Exam Triage Vital Signs ED Triage Vitals  Encounter Vitals Group     BP      Girls Systolic BP Percentile      Girls Diastolic BP Percentile      Boys Systolic BP Percentile      Boys Diastolic BP Percentile      Pulse      Resp      Temp      Temp src      SpO2      Weight      Height      Head Circumference      Peak Flow      Pain Score      Pain Loc      Pain Education      Exclude from Growth Chart    No data found.  Updated Vital Signs BP 138/85 (BP Location: Left Arm)   Pulse 94   Temp 98.3 F (36.8 C) (Oral)   Wt 260 lb 11.2 oz (118.3 kg)   SpO2 96%   BMI 40.83 kg/m   Visual Acuity Right Eye Distance:   Left Eye Distance:   Bilateral Distance:    Right Eye Near:   Left Eye Near:    Bilateral Near:     Physical Exam Vitals and nursing note reviewed.  Constitutional:      Appearance: Normal appearance. She is not ill-appearing.  HENT:     Head: Normocephalic and atraumatic.     Mouth/Throat:     Mouth: Mucous membranes are moist.     Pharynx: Oropharynx is clear. No oropharyngeal exudate or posterior oropharyngeal erythema.  Cardiovascular:     Rate and Rhythm: Normal rate and regular rhythm.     Pulses: Normal pulses.     Heart sounds: Normal heart sounds. No murmur heard.    No friction rub. No gallop.  Pulmonary:     Effort: Pulmonary effort is normal.     Breath sounds: Normal breath sounds. No stridor. No wheezing, rhonchi or rales.  Musculoskeletal:        General: Swelling present. No tenderness or signs of injury.  Skin:    General: Skin is warm and dry.     Capillary Refill: Capillary refill takes  less than 2  seconds.     Findings: Erythema and rash present.  Neurological:     Mental Status: She is alert.      UC Treatments / Results  Labs (all labs ordered are listed, but only abnormal results are displayed) Labs Reviewed - No data to display  EKG   Radiology No results found.  Procedures Procedures (including critical care time)  Medications Ordered in UC Medications  dexamethasone  (DECADRON ) injection 10 mg (has no administration in time range)  famotidine  (PEPCID ) tablet 20 mg (has no administration in time range)    Initial Impression / Assessment and Plan / UC Course  I have reviewed the triage vital signs and the nursing notes.  Pertinent labs & imaging results that were available during my care of the patient were reviewed by me and considered in my medical decision making (see chart for details).   Patient is a pleasant, nontoxic-appearing 28 year old female presenting for evaluation of possible allergic reaction to an insect bite as outlined in the HPI above.  The bite occurred to her right thumb 4 days ago and then she woke up yesterday with swelling around her eyes, swelling to her left arm, hand, and wrist, and itchy rash on both elbows and behind both knees.  She has been using Benadryl  with limited improvement of her symptoms.  She came in today because the swelling on her left hand has increased and she is having difficulty fully opening and closing her left hand.  As you can see noted above, there is some residual erythema around patient's eyes and on her right cheek but the swelling has largely resolved.    She has erythematous, maculopapular rash on both elbows and she has swelling to the left hand.  Her cap refill is less than 3 seconds and her radial and ulnar pulses are 2+.  Range of motion is difficult secondary to the swelling.  As you can see the above, there is a small area of erythema near the medial aspect of the IP joint of the thumb but no overt  swelling to the thumb, fingers, or hand noted.  I will treat the patient for presumptive allergic reaction with 10 mg of IM Decadron  here in clinic and discharge her home on a prednisone  taper.  She has been taking Benadryl  and I will have her continue to do that at night and take Allegra during the day.  I will also have her take Pepcid  20 mg twice daily.  We will give her a dose prior to discharge.  I advised her that if the swelling does not improve, or worsens, or if she develops respiratory symptoms she needs to go to the ER for evaluation.   Final Clinical Impressions(s) / UC Diagnoses   Final diagnoses:  Allergic reaction, initial encounter     Discharge Instructions      Take over-the-counter Allegra 180 mg daily or Zyrtec  or Claritin 10 mg daily to help with your itching.  You can take over-the-counter Benadryl , 50 mg at bedtime, as needed for itching and sleep.  Take the prednisone  pack according to the package instructions.  You will taken on tapering dose over a period of 6 days.  Take it with food and always take it first in the morning with breakfast.  Take over-the-counter Pepcid  20 mg twice daily to help with itching as well.  If you develop any swelling of your lips or tongue, tightness in your throat, or difficulty breathing you need  to go to the ER for evaluation.  Also, if you have any worsening of swelling to your left arm or hand or your fingers drawl up more and you need to go to the ER for evaluation.     ED Prescriptions     Medication Sig Dispense Auth. Provider   predniSONE  (STERAPRED UNI-PAK 21 TAB) 10 MG (21) TBPK tablet Take 6 tablets on day 1, 5 tablets day 2, 4 tablets day 3, 3 tablets day 4, 2 tablets day 5, 1 tablet day 6 21 tablet Bernardino Ditch, NP      PDMP not reviewed this encounter.   Bernardino Ditch, NP 04/14/24 [phone]

## 2024-04-17 ENCOUNTER — Ambulatory Visit (HOSPITAL_COMMUNITY)
Admission: EM | Admit: 2024-04-17 | Discharge: 2024-04-17 | Disposition: A | Discharge status: Home / Self Care | Attending: Emergency Medicine | Admitting: Emergency Medicine

## 2024-04-17 ENCOUNTER — Encounter (HOSPITAL_COMMUNITY): Payer: Self-pay | Admitting: Emergency Medicine

## 2024-04-17 DIAGNOSIS — T7840XD Allergy, unspecified, subsequent encounter: Secondary | ICD-10-CM

## 2024-04-17 DIAGNOSIS — J029 Acute pharyngitis, unspecified: Secondary | ICD-10-CM | POA: Diagnosis not present

## 2024-04-17 DIAGNOSIS — L299 Pruritus, unspecified: Secondary | ICD-10-CM

## 2024-04-17 DIAGNOSIS — R6889 Other general symptoms and signs: Secondary | ICD-10-CM | POA: Diagnosis not present

## 2024-04-17 LAB — POCT RAPID STREP A (OFFICE): Rapid Strep A Screen: NEGATIVE

## 2024-04-17 MED ORDER — METHYLPREDNISOLONE SODIUM SUCC 125 MG IJ SOLR
INTRAMUSCULAR | Status: AC
  Filled 2024-04-17: qty 2

## 2024-04-17 MED ORDER — METHYLPREDNISOLONE SODIUM SUCC 125 MG IJ SOLR
80.0000 mg | Freq: Once | INTRAMUSCULAR | Status: AC
  Administered 2024-04-17: 80 mg via INTRAMUSCULAR

## 2024-04-17 MED ORDER — FAMOTIDINE 20 MG PO TABS
20.0000 mg | ORAL_TABLET | Freq: Once | ORAL | Status: AC
  Administered 2024-04-17: 20 mg via ORAL

## 2024-04-17 MED ORDER — FAMOTIDINE 20 MG PO TABS
ORAL_TABLET | ORAL | Status: AC
  Filled 2024-04-17: qty 1

## 2024-04-17 MED ORDER — DEXAMETHASONE SODIUM PHOSPHATE 10 MG/ML IJ SOLN
10.0000 mg | Freq: Once | INTRAMUSCULAR | Status: DC
Start: 2024-04-17 — End: 2024-04-17

## 2024-04-17 NOTE — ED Triage Notes (Signed)
 Pt reports that she had an allergic reaction and was seen at [facility] on 8/28 where given shot of steroids and prednisone . Reports taken two full days of prednisone . Reports was getting better but now itching has returned and feels like throat is tight. Pt reports is able to swallow and doesn't believe her tongue is swollen. Reports took Benadryl  at 5am today.

## 2024-04-17 NOTE — Discharge Instructions (Addendum)
 Continue taking the steroid Dosepak as prescribed until it is finished. You can take 25 mg of Benadryl  every 6 hours as needed for allergic reaction symptoms. Otherwise recommend taking a daily allergy medication such as Zyrtec  or Claritin once daily to assist with allergic reaction as well. You can also pick up some over-the-counter famotidine  (Pepcid ) 20 mg to take twice daily or when you take Benadryl  for additional relief of your symptoms. I have attached an allergy specialist that you can follow-up with for allergy testing if needed. Otherwise follow-up with your primary care provider or return here as needed. If you develop worsening symptoms as of this point especially throat swelling, tongue swelling, mouth swelling, full-body hives, difficulty breathing, or passing out please seek immediate medical treatment in the emergency department.

## 2024-04-17 NOTE — ED Provider Notes (Signed)
 [facility]    CSN: [id number] Arrival date & time: 04/17/24  1012      History   Chief Complaint Chief Complaint  Patient presents with   Allergic Reaction    HPI Adriana Lewis is a 28 y.o. female.   Patient presents with concerns for continued allergic reaction after being seen at [facility] on 8/28.  Patient presented there with rash and swelling and was diagnosed with allergic reaction at that time.  Patient reports that she was given a steroid injection and started on a steroid taper Dosepak which she has been taking as prescribed over the last few days.    Patient states that she felt her symptoms were getting better and then she woke up at 3 AM this morning with a sensation of throat tightening, but also soreness to her throat.  Patient denies any difficulty swallowing or breathing and is maintaining secretions.    Patient also states that she began to have itching all over around 3 AM this morning as well.  Patient states that she took her steroid as prescribed this morning and also took a dose of Benadryl  this morning without relief.  Patient states that it does not feel like her throat is progressively tightening, but it has not gotten any better since it began at 3 AM this morning.  Patient denies fever, cough, congestion, chest pain, nausea shortness of breath, nausea, vomiting, and diarrhea.  The history is provided by the patient and medical records.  Allergic Reaction   Past Medical History:  Diagnosis Date   Anxiety    Irregular heartbeat    pt reports history of palpitations    Patient Active Problem List   Diagnosis Date Noted   Lumbar herniated disc 11/03/2022   Foot drop, right 11/03/2022   Lumbar radiculopathy 11/02/2022   Status post lumbar spine surgery for decompression of spinal cord (Left L4/5: 01/11/20) 08/27/2020   Lumbar radicular pain 01/11/2020   Term pregnancy 06/21/2018   SVD (spontaneous vaginal delivery) 06/21/2018    Second-degree perineal laceration, with delivery 06/21/2018   Postpartum care following vaginal delivery (11/4) 06/21/2018   Uterine contractions during pregnancy 06/20/2018   Abdominal cramping 12/22/2016   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   Ganglion and cyst of synovium, tendon, and bursa 09/25/2010    Past Surgical History:  Procedure Laterality Date   DILATION AND EVACUATION N/A 02/02/2017   Procedure: DILATATION AND EVACUATION;  Surgeon: Janit Alm Agent, MD;  Location: [facility];  Service: Gynecology;  Laterality: N/A;   HEMI-MICRODISCECTOMY LUMBAR LAMINECTOMY LEVEL 1 Right 11/03/2022   Procedure: HEMI-MICRODISCECTOMY LUMBAR LAMINECTOMY LEVEL 1;  Surgeon: Clois Fret, MD;  Location: [facility];  Service: Neurosurgery;  Laterality: Right;  R L4/5 microdiscectomy   LUMBAR LAMINECTOMY/DECOMPRESSION MICRODISCECTOMY Left 01/11/2020   Procedure: LEFT L4-5 MICRODISCECTOMY;  Surgeon: Clois Fret, MD;  Location: [facility];  Service: Neurosurgery;  Laterality: Left;   TONSILLECTOMY  01/07/2022   UPPER GI ENDOSCOPY     WISDOM TOOTH EXTRACTION      OB History     Gravida  3   Para  1   Term  1   Preterm  0   AB  1   Living  1      SAB  1   IAB  0   Ectopic  0   Multiple  0   Live Births  1            Home Medications    Prior to  Admission medications   Medication Sig Start Date End Date Taking? Authorizing Provider  cyanocobalamin (VITAMIN B12) 1000 MCG tablet Take 1,000 mcg by mouth daily.    [provider]  predniSONE  (STERAPRED UNI-PAK 21 TAB) 10 MG (21) TBPK tablet Take 6 tablets on day 1, 5 tablets day 2, 4 tablets day 3, 3 tablets day 4, 2 tablets day 5, 1 tablet day 6 04/14/24   Bernardino Ditch, NP    Family History Family History  Problem Relation Age of Onset   Diabetes Mother    Hypertension Mother    Varicose Veins Mother    Hepatitis B Father    Cirrhosis Father    Diabetes Sister    Hypertension Sister    Heart disease  Maternal Grandmother    Cancer Paternal Grandmother        stomach   Diabetes Paternal Grandfather    COPD Paternal Grandfather     Social History Social History   Tobacco Use   Smoking status: Never   Smokeless tobacco: Never  Vaping Use   Vaping status: Never Used  Substance Use Topics   Alcohol use: No    Comment: rarely 2-3 glasses of wine per year   Drug use: No     Allergies   Latex and Sulfa antibiotics   Review of Systems Review of Systems  Per HPI  Physical Exam Triage Vital Signs ED Triage Vitals  Encounter Vitals Group     BP 04/17/24 1023 134/82     Girls Systolic BP Percentile --      Girls Diastolic BP Percentile --      Boys Systolic BP Percentile --      Boys Diastolic BP Percentile --      Pulse Rate 04/17/24 1023 95     Resp 04/17/24 1023 18     Temp 04/17/24 1023 97.8 F (36.6 C)     Temp Source 04/17/24 1023 Oral     SpO2 04/17/24 1023 98 %     Weight --      Height --      Head Circumference --      Peak Flow --      Pain Score 04/17/24 1022 5     Pain Loc --      Pain Education --      Exclude from Growth Chart --    No data found.  Updated Vital Signs BP 134/82 (BP Location: Right Arm)   Pulse 95   Temp 97.8 F (36.6 C) (Oral)   Resp 18   SpO2 98%   Visual Acuity Right Eye Distance:   Left Eye Distance:   Bilateral Distance:    Right Eye Near:   Left Eye Near:    Bilateral Near:     Physical Exam Vitals and nursing note reviewed.  Constitutional:      General: She is awake. She is not in acute distress.    Appearance: Normal appearance. She is well-developed and well-groomed. She is not ill-appearing.  HENT:     Nose: Nose normal.     Mouth/Throat:     Mouth: Mucous membranes are moist.     Pharynx: Oropharynx is clear. No pharyngeal swelling or posterior oropharyngeal erythema.     Tonsils: No tonsillar exudate.  Cardiovascular:     Rate and Rhythm: Normal rate and regular rhythm.  Pulmonary:     Effort:  Pulmonary effort is normal.     Breath sounds: Normal breath sounds.  Skin:  General: Skin is warm and dry.     Findings: No rash.  Neurological:     Mental Status: She is alert.  Psychiatric:        Behavior: Behavior is cooperative.      UC Treatments / Results  Labs (all labs ordered are listed, but only abnormal results are displayed) Labs Reviewed  POCT RAPID STREP A (OFFICE)    EKG   Radiology No results found.  Procedures Procedures (including critical care time)  Medications Ordered in UC Medications  famotidine  (PEPCID ) tablet 20 mg (20 mg Oral Given 04/17/24 1041)  methylPREDNISolone  sodium succinate (SOLU-MEDROL ) 125 mg/2 mL injection 80 mg (80 mg Intramuscular Given 04/17/24 1041)    Initial Impression / Assessment and Plan / UC Course  I have reviewed the triage vital signs and the nursing notes.  Pertinent labs & imaging results that were available during my care of the patient were reviewed by me and considered in my medical decision making (see chart for details).     Patient is overall well-appearing.  Vitals are stable.  No significant findings upon exam.  Absence of rash at this time.  No obvious swelling noted to oropharynx.  Lungs clear bilaterally on auscultation.  Rapid strep testing was negative.  This was performed as a rule out due to presence of sore throat.  Given IM Solu-Medrol  and  PO Pepcid  in clinic.  Reassess patient after about 30 minutes and patient reported relief of sensation of throat tightening as well as itching.  Recommended patient continue with steroid Dosepak and discussed use of over-the-counter antihistamines.  Also recommended taking famotidine  with antihistamines for additional relief.  Given information for allergy specialist if needed.  Discussed follow-up, return, and strict ER precautions. Final Clinical Impressions(s) / UC Diagnoses   Final diagnoses:  Sore throat  Sensation of swollen throat  Pruritus  Allergic  reaction, subsequent encounter     Discharge Instructions      Continue taking the steroid Dosepak as prescribed until it is finished. You can take 25 mg of Benadryl  every 6 hours as needed for allergic reaction symptoms. Otherwise recommend taking a daily allergy medication such as Zyrtec  or Claritin once daily to assist with allergic reaction as well. You can also pick up some over-the-counter famotidine  (Pepcid ) 20 mg to take twice daily or when you take Benadryl  for additional relief of your symptoms. I have attached an allergy specialist that you can follow-up with for allergy testing if needed. Otherwise follow-up with your primary care provider or return here as needed. If you develop worsening symptoms as of this point especially throat swelling, tongue swelling, mouth swelling, full-body hives, difficulty breathing, or passing out please seek immediate medical treatment in the emergency department.     ED Prescriptions   None    PDMP not reviewed this encounter.   Johnie Flaming A, NP 04/17/24 1128

## 2024-07-07 ENCOUNTER — Ambulatory Visit

## 2024-07-18 ENCOUNTER — Ambulatory Visit: Admitting: Physician Assistant

## 2024-07-18 ENCOUNTER — Ambulatory Visit

## 2024-07-18 ENCOUNTER — Other Ambulatory Visit: Payer: Self-pay | Admitting: Physician Assistant

## 2024-07-18 ENCOUNTER — Encounter: Payer: Self-pay | Admitting: Physician Assistant

## 2024-07-18 VITALS — BP 126/74 | Ht 67.0 in | Wt 260.0 lb

## 2024-07-18 DIAGNOSIS — M5416 Radiculopathy, lumbar region: Secondary | ICD-10-CM

## 2024-07-18 DIAGNOSIS — M545 Low back pain, unspecified: Secondary | ICD-10-CM

## 2024-07-18 MED ORDER — CELECOXIB 200 MG PO CAPS: 200.0000 mg | ORAL_CAPSULE | Freq: Every day | ORAL | 0 refills | Status: DC

## 2024-07-18 MED ORDER — TIZANIDINE HCL 4 MG PO TABS: 2.0000 mg | ORAL_TABLET | Freq: Three times a day (TID) | ORAL | 1 refills | Status: AC | PRN

## 2024-07-18 NOTE — Progress Notes (Signed)
 Referring Physician:  No referring provider defined for this encounter.  Primary Physician:  Bari Lynwood RAMAN, MD (Inactive)  Surgeries: 11/03/22 Right L4-5 microdisectomy  01/11/20, Left L4-5 microdiscectomy   History of Present Illness: 07/18/2024 [name] is here today with a history of left L4-5 microdiscectomy in 2021 and right L4-5 discectomy in 2024.  She comes today for back pain for the last 7 days.  She states she woke up in the morning and had intense back pain in which her back also locked up.  Her pain was severe and primarily in her left low back.  She was started on a steroid pack that she is due to finish tomorrow.  This has helped her pain significantly.  She denies any radiating pain down either leg.  She denies any numbness and tingling, frank weakness, saddle anesthesia.  She does have some chronic weakness and numbness and tingling in her right lower extremity that is unchanged.     Conservative measures:  Physical therapy: none Multimodal medical therapy including regular antiinflammatories: prednisone   Injections: none epidural steroid injections  Past Surgery: above  Deserea Bordley Heming has no symptoms of cervical myelopathy.  The symptoms are causing a significant impact on the patient's life.   Review of Systems:  A 10 point review of systems is negative, except for the pertinent positives and negatives detailed in the HPI.  Past Medical History: Past Medical History:  Diagnosis Date   Anxiety    Irregular heartbeat    pt reports history of palpitations    Past Surgical History: Past Surgical History:  Procedure Laterality Date   DILATION AND EVACUATION N/A 02/02/2017   Procedure: DILATATION AND EVACUATION;  Surgeon: Janit Alm Lynwood, MD;  Location: [facility];  Service: Gynecology;  Laterality: N/A;   HEMI-MICRODISCECTOMY LUMBAR LAMINECTOMY LEVEL 1 Right 11/03/2022   Procedure: HEMI-MICRODISCECTOMY LUMBAR LAMINECTOMY LEVEL 1;   Surgeon: Clois Fret, MD;  Location: [facility];  Service: Neurosurgery;  Laterality: Right;  R L4/5 microdiscectomy   LUMBAR LAMINECTOMY/DECOMPRESSION MICRODISCECTOMY Left 01/11/2020   Procedure: LEFT L4-5 MICRODISCECTOMY;  Surgeon: Clois Fret, MD;  Location: [facility];  Service: Neurosurgery;  Laterality: Left;   TONSILLECTOMY  01/07/2022   UPPER GI ENDOSCOPY     WISDOM TOOTH EXTRACTION      Allergies: Allergies as of 07/18/2024 - Review Complete 07/18/2024  Allergen Reaction Noted   Latex Hives and Rash 02/01/2015   Sulfa antibiotics Hives and Rash 09/07/2013    Medications: Outpatient Encounter Medications as of 07/18/2024  Medication Sig   Cholecalciferol (D 1000) 25 MCG (1000 UT) capsule Take 1,000 Units by mouth.   cyanocobalamin (VITAMIN B12) 1000 MCG tablet Take 1,000 mcg by mouth daily.   [START ON 07/19/2024] DULoxetine (CYMBALTA) 60 MG capsule Take 60 mg by mouth.   predniSONE  (STERAPRED UNI-PAK 21 TAB) 10 MG (21) TBPK tablet Take 6 tablets on day 1, 5 tablets day 2, 4 tablets day 3, 3 tablets day 4, 2 tablets day 5, 1 tablet day 6   No facility-administered encounter medications on file as of 07/18/2024.    Social History: Social History   Tobacco Use   Smoking status: Never   Smokeless tobacco: Never  Vaping Use   Vaping status: Never Used  Substance Use Topics   Alcohol use: No    Comment: rarely 2-3 glasses of wine per year   Drug use: No    Family Medical History: Family History  Problem Relation Age of Onset   Diabetes  Mother    Hypertension Mother    Varicose Veins Mother    Hepatitis B Father    Cirrhosis Father    Diabetes Sister    Hypertension Sister    Heart disease Maternal Grandmother    Cancer Paternal Grandmother        stomach   Diabetes Paternal Grandfather    COPD Paternal Grandfather     Physical Examination: @VITALWITHPAIN @  General: Patient is well developed, well nourished, calm, collected, and in no apparent  distress. Attention to examination is appropriate.  Psychiatric: Patient is non-anxious.  Head:  Pupils equal, round, and reactive to light.  ENT:  Oral mucosa appears well hydrated.  Neck:   Supple.  Full range of motion.  Respiratory: Patient is breathing without any difficulty.  Extremities: No edema.  Vascular: Palpable dorsal pedal pulses.  Skin:   On exposed skin, there are no abnormal skin lesions.  NEUROLOGICAL:     Awake, alert, oriented to person, place, and time.  Speech is clear and fluent. Fund of knowledge is appropriate.   Cranial Nerves: Pupils equal round and reactive to light.  Facial tone is symmetric.  ROM of spine: Some tenderness palpation of lumbar paraspinals.   Strength:  Side Iliopsoas Quads Hamstring PF DF EHL  R 5 5 5 5 5 5   L 5 5 5 5 5 5    Negative straight leg raise.   Reflexes are 2+ and symmetric at the patella.  Trace Achilles on the right, 1+ on the left. Clonus is not present.   She has some chronic sensation changes from previous disc herniation in her lower extremities. Gait is normal.   No difficulty with tandem gait.   No evidence of dysmetria noted.  Medical Decision Making  Imaging: EXAM: MRI LUMBAR SPINE WITHOUT CONTRAST   TECHNIQUE: Multiplanar, multisequence MR imaging of the lumbar spine was performed. No intravenous contrast was administered.   COMPARISON:  Radiograph from 11/03/22 as well as prior MRI from 11/02/2022.   FINDINGS: Segmentation: Standard. Lowest well-formed disc space labeled the L5-S1 level.   Alignment: Trace dextroscoliosis. Alignment otherwise normal preservation of the normal lumbar lordosis. No listhesis.   Vertebrae: Vertebral body height maintained without acute or chronic fracture. Bone marrow signal intensity within normal limits. No worrisome osseous lesions. Reactive endplate change present about the L4-5 interspace, worse on the right. No other abnormal marrow edema.   Conus  medullaris and cauda equina: Conus extends to the L1 level. Conus and cauda equina appear normal.   Paraspinal and other soft tissues: Postoperative changes from prior right posterior decompression at L4-5 noted within the lower posterior paraspinous soft tissues. Small 2 cm residual collection along the lower midline incision most likely reflects a small residual seroma (series 8, image 20). Paraspinous soft tissues demonstrate no other acute finding. 1.7 cm simple cyst present within the interpolar right kidney, benign in appearance, no follow-up imaging recommended.   Disc levels:   T11-12: Left paracentral disc protrusion indents the ventral thecal sac (series 8, image 3). Secondary flattening of the left hemi cord without cord signal changes. Mild spinal stenosis. Foramina remain patent.   T12-L1: Unremarkable.   L1-2:  Unremarkable.   L2-3: Mild annular disc bulge. Superimposed small chronic endplate Schmorl's node deformities. No spinal stenosis. Foramina remain patent.   L3-4: Tiny endplate Schmorl's node deformity without significant disc bulge. Mild facet hypertrophy. No spinal stenosis. Foramina remain patent.   L4-5: Disc desiccation with mild disc bulge. Associated mild reactive  endplate change with marginal endplate osteophytic spurring. Postoperative changes from prior right hemi laminectomy with micro discectomy. Tiny residual and/or recurrent central disc protrusion with annular fissure minimally indents the ventral thecal sac (series 8, image 33). Mild facet hypertrophy. No significant residual spinal stenosis. Mild bilateral L4 foraminal narrowing, stable.   L5-S1: Normal interspace. Mild to moderate facet hypertrophy. No spinal stenosis. Foramina remain patent.   IMPRESSION: 1. Postoperative changes from prior right hemi laminectomy with micro discectomy at L4-5. Tiny residual and/or recurrent central disc protrusion at this level without significant  stenosis or residual impingement. 2. Left paracentral disc protrusion at T11-12 with secondary flattening of the left hemi cord and mild spinal stenosis. 3. Mild to moderate bilateral facet hypertrophy at L3-4 through L5-S1. 4. No other acute abnormality within the lumbar spine. No frank cord compression.      I have personally reviewed the images and agree with the above interpretation.  Assessment and Plan: Ms. Klutts is a pleasant 28 y.o. female is here today with a history of left L4-5 microdiscectomy in 2021 and right L4-5 discectomy in 2024.  She comes today for back pain for the last 7 days.  She states she woke up in the morning and had intense back pain in which her back also locked up.  Her pain was severe and primarily in her left low back.  She was started on a steroid pack that she is due to finish tomorrow.  This has helped her pain significantly.  She denies any radiating pain down either leg.  Thankfully on examination she has full strength.  Plan includes the following moving forward:   - Physical therapy referral placed - X-rays today including flexion-extension to evaluate for listhesis. - Counseled patient on limiting bending, lifting, twisting.  Advised her to use heat and ice as needed. - Plan to have patient finish prednisone  dose.  Will start Celebrex once complete and advised patient to also take Tylenol .  May use tizanidine as needed for muscle spasms.  This may make her drowsy.  She was advised she can cut it in half.  Risk and benefits of this medication was discussed at length. - Plan to see back in approximately 8 weeks.  Would consider MRI in the future.   Thank you for involving me in the care of this patient.   I spent a total of 40 minutes in both face-to-face and non-face-to-face activities for this visit on the date of this encounter including preparing to see the patient, obtaining and reviewing separately obtained history, performing medically  appropriate examination, counseling the patient, ordering additional medications and tests, documenting clinical information, independently interpreting results, coordination of care.   Lyle Decamp, PA-C Dept. of Neurosurgery

## 2024-08-15 ENCOUNTER — Telehealth: Payer: Self-pay | Admitting: Physician Assistant

## 2024-08-15 ENCOUNTER — Other Ambulatory Visit: Payer: Self-pay | Admitting: Orthopedic Surgery

## 2024-08-15 DIAGNOSIS — Z9889 Other specified postprocedural states: Secondary | ICD-10-CM

## 2024-08-15 DIAGNOSIS — M5416 Radiculopathy, lumbar region: Secondary | ICD-10-CM

## 2024-08-15 NOTE — Telephone Encounter (Addendum)
 Refill of celebrex  sent to her pharmacy. Please let her know.   When you call to let her know, please check to be sure she is tolerating the celebrex  with no side effects. She has a sulfa allergy and there is a very low risk of cross sensitivity. She should discuss with pharmacist if she has any concerns.

## 2024-08-15 NOTE — Telephone Encounter (Signed)
 I sent her celebrex  today. New refill request sent back denied.

## 2024-08-16 ENCOUNTER — Telehealth: Payer: Self-pay | Admitting: Physician Assistant

## 2024-08-16 DIAGNOSIS — Z9889 Other specified postprocedural states: Secondary | ICD-10-CM

## 2024-08-16 DIAGNOSIS — M5416 Radiculopathy, lumbar region: Secondary | ICD-10-CM

## 2024-08-16 MED ORDER — METHYLPREDNISOLONE 4 MG PO TBPK: ORAL_TABLET | ORAL | 0 refills | Status: AC

## 2024-08-16 NOTE — Telephone Encounter (Signed)
 Date-08/16/24 Provider FRISBIE Med Requesting: prednisone  steroid dose pack Pharmacy: [facility] [address] [place], [place], [place] [address]  Patient contact: [phone]

## 2024-08-16 NOTE — Telephone Encounter (Signed)
 Patient notified; verbalized understanding. She stated she has not heard from PT. She is going to give them a call tomorrow to make appointment.

## 2024-08-16 NOTE — Telephone Encounter (Signed)
 Spoke to patient she states she is doing fine with Celebrex .

## 2024-08-16 NOTE — Addendum Note (Signed)
 Addended by: HILMA HASTINGS on: 08/16/2024 04:38 PM   Modules accepted: Orders

## 2024-08-16 NOTE — Telephone Encounter (Addendum)
 Looks like she had a steroid pack back in August and then on 07/13/24.  Okay to refill dose pack, but this would be her last one for awhile- I don't recommend more than 3 over 12 months.   Recommend she stop celebrex  while taking dose pack.  I also would schedule her a f/u to see Lyle in next 2-3 weeks to discuss further options if no improvement

## 2024-08-16 NOTE — Telephone Encounter (Signed)
 LMOM informed patient medication was sent to the pharmacy. She is to call us  back and let us  know if she is having any side effects with the medication.

## 2024-09-13 ENCOUNTER — Other Ambulatory Visit: Payer: Self-pay | Admitting: Orthopedic Surgery

## 2024-09-13 DIAGNOSIS — Z9889 Other specified postprocedural states: Secondary | ICD-10-CM

## 2024-09-13 DIAGNOSIS — M5416 Radiculopathy, lumbar region: Secondary | ICD-10-CM

## 2024-09-13 NOTE — Telephone Encounter (Signed)
 Needs f/u appointment in person

## 2024-09-14 ENCOUNTER — Other Ambulatory Visit: Payer: Self-pay | Admitting: Physician Assistant
